# Patient Record
Sex: Female | Born: 1950 | Race: Black or African American | Hispanic: No | Marital: Single | Smoking: Former smoker
Health system: Southern US, Community
[De-identification: ages and names within clinical notes are randomized; demographics above are authoritative.]

## PROBLEM LIST (undated history)

## (undated) DIAGNOSIS — I700 Atherosclerosis of aorta: Secondary | ICD-10-CM

## (undated) DIAGNOSIS — Z87891 Personal history of nicotine dependence: Secondary | ICD-10-CM

## (undated) DIAGNOSIS — E119 Type 2 diabetes mellitus without complications: Secondary | ICD-10-CM

## (undated) DIAGNOSIS — I110 Hypertensive heart disease with heart failure: Secondary | ICD-10-CM

## (undated) DIAGNOSIS — I4729 Other ventricular tachycardia: Secondary | ICD-10-CM

## (undated) DIAGNOSIS — I509 Heart failure, unspecified: Secondary | ICD-10-CM

## (undated) DIAGNOSIS — I5089 Other heart failure: Secondary | ICD-10-CM

## (undated) DIAGNOSIS — I6529 Occlusion and stenosis of unspecified carotid artery: Secondary | ICD-10-CM

## (undated) DIAGNOSIS — Z86711 Personal history of pulmonary embolism: Secondary | ICD-10-CM

## (undated) DIAGNOSIS — R229 Localized swelling, mass and lump, unspecified: Secondary | ICD-10-CM

## (undated) DIAGNOSIS — I471 Supraventricular tachycardia, unspecified: Secondary | ICD-10-CM

## (undated) DIAGNOSIS — E785 Hyperlipidemia, unspecified: Secondary | ICD-10-CM

## (undated) DIAGNOSIS — J45909 Unspecified asthma, uncomplicated: Secondary | ICD-10-CM

## (undated) DIAGNOSIS — J449 Chronic obstructive pulmonary disease, unspecified: Secondary | ICD-10-CM

## (undated) DIAGNOSIS — E559 Vitamin D deficiency, unspecified: Secondary | ICD-10-CM

## (undated) DIAGNOSIS — I493 Ventricular premature depolarization: Secondary | ICD-10-CM

## (undated) DIAGNOSIS — M8580 Other specified disorders of bone density and structure, unspecified site: Secondary | ICD-10-CM

## (undated) DIAGNOSIS — I10 Essential (primary) hypertension: Secondary | ICD-10-CM

## (undated) DIAGNOSIS — M899 Disorder of bone, unspecified: Secondary | ICD-10-CM

## (undated) DIAGNOSIS — I2510 Atherosclerotic heart disease of native coronary artery without angina pectoris: Secondary | ICD-10-CM

## (undated) DIAGNOSIS — E669 Obesity, unspecified: Secondary | ICD-10-CM

## (undated) DIAGNOSIS — M1990 Unspecified osteoarthritis, unspecified site: Secondary | ICD-10-CM

## (undated) DIAGNOSIS — M109 Gout, unspecified: Secondary | ICD-10-CM

## (undated) DIAGNOSIS — I779 Disorder of arteries and arterioles, unspecified: Secondary | ICD-10-CM

## (undated) HISTORY — DX: Hypertensive heart disease with heart failure: I11.0

## (undated) HISTORY — DX: Heart failure, unspecified: I50.9

## (undated) HISTORY — DX: Other specified disorders of bone density and structure, unspecified site: M85.80

## (undated) HISTORY — DX: Unspecified asthma, uncomplicated: J45.909

## (undated) HISTORY — DX: Occlusion and stenosis of unspecified carotid artery: I65.29

## (undated) HISTORY — DX: Type 2 diabetes mellitus without complications: E11.9

## (undated) HISTORY — DX: Obesity, unspecified: E66.9

## (undated) HISTORY — DX: Disorder of bone, unspecified: M89.9

## (undated) HISTORY — DX: Personal history of pulmonary embolism: Z86.711

## (undated) HISTORY — DX: Personal history of nicotine dependence: Z87.891

## (undated) HISTORY — DX: Disorder of arteries and arterioles, unspecified: I77.9

## (undated) HISTORY — DX: Hypertensive heart disease with heart failure: I50.89

## (undated) HISTORY — DX: Other ventricular tachycardia: I47.29

## (undated) HISTORY — DX: Atherosclerosis of aorta: I70.0

## (undated) HISTORY — DX: Gout, unspecified: M10.9

## (undated) HISTORY — PX: ABDOMINAL HYSTERECTOMY: SHX81

## (undated) HISTORY — DX: Atherosclerotic heart disease of native coronary artery without angina pectoris: I25.10

## (undated) HISTORY — DX: Hyperlipidemia, unspecified: E78.5

## (undated) HISTORY — DX: Localized swelling, mass and lump, unspecified: R22.9

## (undated) HISTORY — DX: Ventricular premature depolarization: I49.3

## (undated) HISTORY — DX: Supraventricular tachycardia, unspecified: I47.10

## (undated) HISTORY — DX: Vitamin D deficiency, unspecified: E55.9

---

## 2000-09-06 ENCOUNTER — Emergency Department (HOSPITAL_COMMUNITY): Admission: EM | Admit: 2000-09-06 | Discharge: 2000-09-06 | Payer: Self-pay

## 2000-09-21 ENCOUNTER — Encounter: Admission: RE | Admit: 2000-09-21 | Discharge: 2000-09-21 | Payer: Self-pay | Admitting: Obstetrics & Gynecology

## 2000-10-13 ENCOUNTER — Ambulatory Visit: Admission: RE | Admit: 2000-10-13 | Discharge: 2000-10-13 | Payer: Self-pay | Admitting: Gynecology

## 2000-10-15 ENCOUNTER — Encounter: Payer: Self-pay | Admitting: Gynecology

## 2000-10-19 ENCOUNTER — Encounter (INDEPENDENT_AMBULATORY_CARE_PROVIDER_SITE_OTHER): Payer: Self-pay

## 2000-10-19 ENCOUNTER — Inpatient Hospital Stay (HOSPITAL_COMMUNITY): Admission: RE | Admit: 2000-10-19 | Discharge: 2000-10-21 | Payer: Self-pay | Admitting: Gynecology

## 2000-10-26 ENCOUNTER — Encounter: Admission: RE | Admit: 2000-10-26 | Discharge: 2000-10-26 | Payer: Self-pay | Admitting: Obstetrics & Gynecology

## 2008-07-14 ENCOUNTER — Emergency Department (HOSPITAL_COMMUNITY): Admission: EM | Admit: 2008-07-14 | Discharge: 2008-07-14 | Payer: Self-pay | Admitting: Emergency Medicine

## 2009-02-12 ENCOUNTER — Encounter: Admission: RE | Admit: 2009-02-12 | Discharge: 2009-02-12 | Payer: Self-pay | Admitting: Internal Medicine

## 2010-11-02 ENCOUNTER — Encounter: Payer: Self-pay | Admitting: Internal Medicine

## 2011-02-27 NOTE — Discharge Summary (Signed)
[facility]  Patient:    Miranda [name], Miranda [name]                      MRN: [id number] Adm. Date:  [id number] Disc. Date: 10/21/00 Attending:  Jeannette Corpus Dictator:   J CC:         GYN oncology office at [facility]  GYN outpatient clinic at [facility]   Discharge Summary  CHIEF COMPLAINT:  The patient is a 60 year old African-American female, para 2, who presents for operative management of bilateral adnexal masses post-hysterectomy.  Please see the dictated history and physical as per Dr. De Blanch for further details.  HOSPITAL COURSE:  The patient was admitted, underwent an exploratory laparotomy with bilateral salpingo-oophorectomy, lysis of adhesions, partial omentectomy, and bilateral ureterolysis.  Again, please see the dictated operative summary for further details.  The patients postoperative course was remarkable for a low-grade temperature with a maximum temperature of 100.4 within the first 24 hours.  This temperature was unsustained.  She was discharged to home on postoperative day #2, tolerating a regular diet.  DISCHARGE DIAGNOSIS:  Bilateral hydrosalpinges with benign ovarian cyst.  PROCEDURES:  Exploratory laparotomy with bilateral salpingo-oophorectomy, lysis of adhesions, partial omentectomy, and bilateral ureterolysis.  CONDITION ON DISCHARGE:  Good.  DIET:  Regular.  ACTIVITY:  No strenuous activity, no driving for 2 weeks.  DISCHARGE MEDICATIONS: 1. Percocet one or two tabs p.o. q.6h. p.r.n. 2. Climara patch every week.  DISPOSITION:  The patient was to return to the GYN outpatient clinic the following Tuesday for staple removal. DD:  10/21/00 TD:  10/21/00 Job: [id number]

## 2011-02-27 NOTE — Consult Note (Signed)
[facility]  Patient:    Miranda [name], Miranda [name]                      MRN: [id number] Proc. Date: 10/13/00 Adm. Date:  [id number] Attending:  Jeannette Corpus CC:         Roseanna Rainbow, M.D.  Telford Nab, R.N.   Consultation Report  HISTORY:  [age]-year-old African-American female, gravida 2, para 2, referred for consultation regarding management of a newly diagnosed large pelvic mass.  Patient underwent an abdominal hysterectomy in 1999 for treatment of fibroids. Her ovaries were left in at that time.  She presented to the emergency room at [facility] with left-sided flank pain and abdominal pain in November, at which time she had a CAT scan showing a large cystic mass of the right pelvis measuring 7.5 x 10.5 cm.  Some mural calcifications and nodular densities were noted in the wall of the cyst and a tubular-appearing cystic lesion in the left adnexa was also noted measuring 3.2 x 8.7 cm.  CA125 value was 6.8 u/ml.  Over the past several weeks, the patient has noted persistent pelvic fullness, pressure and slight discomfort but no acute pain.  She denies any nausea or vomiting or any other GI or GU symptoms.  PAST MEDICAL HISTORY Medical illnesses:  Cholelithiasis, chronic hypertension, pulmonary embolus associated with use of oral contraceptives.  PAST SURGICAL HISTORY:  Bilateral tubal ligation, TAH.  DRUG ALLERGIES:  None.  SOCIAL HISTORY:  The patient smokes one pack per day.  She has two living children.  She works in [employer].  FAMILY HISTORY:  Family history reveals mother with breast cancer.  There is no gynecologic or colon cancer in the family history.  CURRENT MEDICATIONS:  Norvasc.  REVIEW OF SYSTEMS:  Essentially negative except as noted in history of present illness.  PHYSICAL EXAMINATION  VITAL SIGNS:  Height 5 feet 8 inches.  Weight 178 pounds.  Blood pressure 150/90, pulse  76, respiratory rate 20.  GENERAL:  The patient is a healthy female in no acute distress.  HEENT:  Negative.  NECK:  Supple without thyromegaly.  NODES:  There is no supraclavicular or inguinal adenopathy.  ABDOMEN:  Soft, nontender.  There is some fullness in the suprapubic region. She has a well-healed Pfannenstiel incision which is retracted from secondary healing.  PELVIC:  EG/BUS normal.  Vagina, bladder and urethra are normal.  No lesions are noted.  Bimanual exam reveals a cystic lesion filling nearly the entire pelvis and extending several centimeters above the symphysis pubis. Rectovaginal exam confirms.  EXTREMITIES:  Lower extremities without edema or varicosities.  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  We would recommend the patient undergo exploratory laparotomy and bilateral salpingo-oophorectomy with associated frozen sections.  I believe this can be accomplished through a Pfannenstiel incision and this would allow [place] to revise her scar.  The patient is aware that if upper abdominal surgery is necessary, she may require a second midline incision.  We will proceed with surgery on January 8th, working in conjunction with Dr. Roseanna Rainbow.  Risks of surgery have been discussed; the patient wishes to proceed, as noted above. DD:  10/13/00 TD:  10/13/00 Job: [id number]

## 2011-02-27 NOTE — Op Note (Signed)
[facility]  Patient:    Miranda [name], Miranda [name]                      MRN: [id number] Adm. Date:  [id number] Attending:  Jeannette Corpus CC:         Roseanna Rainbow, M.D.             Telford Nab, R.N.                           Operative Report  PREOPERATIVE DIAGNOSIS:  Bilateral complex pelvic masses.  POSTOPERATIVE DIAGNOSES:  Bilateral ovarian cysts and hydrosalpinges, dense omental adhesions in the pelvis, retroperitoneal fibrosis.  PROCEDURE:  Exploratory laparotomy, extensive lysis of adhesions, bilateral salpingo-oophorectomy, partial omentectomy, bilateral ureterolysis.  SURGEON:  Daniel L. Clarke-Pearson, M.D.  ASSISTANTS Roseanna Rainbow, M.D. Telford Nab, R.N.  ANESTHESIA:  General with orotracheal tube.  ESTIMATED BLOOD LOSS:  200 cc.  SURGICAL FINDINGS:  At exploratory laparotomy, the omentum was found to be densely adherent to all pelvic peritoneal surfaces, including the vaginal cuff, bladder flap, sigmoid colon and the bilateral pelvic cysts.  There was retroperitoneal fibrosis.  The cysts were approximately 6 to 8 cm in diameter, filled with clear fluid and densely adherent to the lateral pelvic sidewall and sigmoid colon on the left.  The upper abdomen was normal.  DESCRIPTION OF PROCEDURE:  The patient was brought to the operating room and after satisfactory attainment of general anesthesia, was placed in the modified lithotomy position in the [place] stirrups.  The anterior abdominal wall, perineum and vagina were prepped, Foley catheter was placed and the patient was draped.  The abdomen was entered through the prior Pfannenstiel incision and additional skin and fat were excised in an elliptical incision in order to achieve a better cosmetic outcome, given that the prior incision was retracted.  Upon entering the peritoneal cavity, dense adhesions of the omentum to the anterior abdominal wall and pelvis were  found; these were freed with sharp and blunt dissection and using cautery for hemostasis.  Once the omentum was freed from its attachments, it was packed into the upper abdomen. Peritoneal washings were obtained.  The upper abdomen was explored, with the above-noted findings.  A Bookwalter retractor was positioned and the bowel packed out of the pelvis.  The right retroperitoneal space was opened, identifying the external and internal iliac arteries and veins and the ureter. The ovarian vessels were skeletonized, clamped, cut, free-tied and suture-ligated.  Further blunt and sharp dissection were used to free adhesions from the cyst to the sigmoid colon and bladder and residual omentum which remained in the pelvis.  The ureter was identified and because of her dense fibrosis adjacent to the peritoneal attachments to the cyst, ureterolysis was performed and the ureter was reflected laterally.  The remainder of the peritoneal attachments to the cyst and the lateral pelvic sidewall were incised.  The attachment of the cyst to the vaginal cuff was cross-clamped, divided and suture-ligated.  The cyst, which included the ovary and fallopian tube, was handed off the operative field and submitted for frozen section, which returned as probable fallopian tube cyst which was benign.  A similar procedure was performed on the right side of the pelvis where a similar cyst was found.  Ureterolysis was again performed in order to protect the ureter and free it from retroperitoneal fibrosis.  The sigmoid colon was adherent to the cyst and  this was freed away by blunt and sharp dissection.  The left ovary and tube were submitted to pathology for frozen section, which again returned benign.  The pelvis was irrigated with copious amounts of warm saline.  The ureters were reinspected and found to be free from any injury.  The omentum was then evaluated and because there were a number of bleeding sites, a  partial omentectomy was performed, clamping vascular pedicles distal to the transverse colon.  These pedicles were ligated using 2-0 Vicryl; hemostasis was then assured.  The pelvis was again irrigated with copious warm saline, the retractors and packs removed and the anterior abdominal wall closed in layers, the first being a running suture of 2-0 Vicryl on the peritoneum.  The fascia was reapproximated with a running suture of 0 PDS. Subcutaneous tissue was reapproximated using 3-0 Vicryl and the skin closed with skin staples.  A dressing was applied.  The patient was awakened from anesthesia and taken to the recovery room in satisfactory condition.  Sponge, needle and instrument counts were correct x 2. DD:  10/19/00 TD:  10/19/00 Job: [id number]

## 2013-09-15 ENCOUNTER — Other Ambulatory Visit: Payer: Self-pay | Admitting: Internal Medicine

## 2013-09-15 DIAGNOSIS — Z1231 Encounter for screening mammogram for malignant neoplasm of breast: Secondary | ICD-10-CM

## 2013-12-12 ENCOUNTER — Encounter: Payer: Self-pay | Admitting: Internal Medicine

## 2013-12-12 ENCOUNTER — Other Ambulatory Visit: Payer: Self-pay | Admitting: Internal Medicine

## 2014-01-09 ENCOUNTER — Encounter: Payer: Self-pay | Admitting: Internal Medicine

## 2016-03-12 ENCOUNTER — Ambulatory Visit (INDEPENDENT_AMBULATORY_CARE_PROVIDER_SITE_OTHER): Payer: BC Managed Care – PPO | Admitting: Physician Assistant

## 2016-03-12 VITALS — BP 128/72 | HR 71 | Temp 98.2°F | Resp 15 | Ht 68.0 in | Wt 181.4 lb

## 2016-03-12 DIAGNOSIS — Z760 Encounter for issue of repeat prescription: Secondary | ICD-10-CM

## 2016-03-12 DIAGNOSIS — M545 Low back pain, unspecified: Secondary | ICD-10-CM

## 2016-03-12 DIAGNOSIS — N390 Urinary tract infection, site not specified: Secondary | ICD-10-CM | POA: Diagnosis not present

## 2016-03-12 DIAGNOSIS — I10 Essential (primary) hypertension: Secondary | ICD-10-CM | POA: Diagnosis not present

## 2016-03-12 DIAGNOSIS — R82998 Other abnormal findings in urine: Secondary | ICD-10-CM

## 2016-03-12 LAB — CBC
HEMATOCRIT: 35.8 % (ref 35.0–45.0)
Hemoglobin: 11.9 g/dL (ref 11.7–15.5)
MCH: 30.4 pg (ref 27.0–33.0)
MCHC: 33.2 g/dL (ref 32.0–36.0)
MCV: 91.3 fL (ref 80.0–100.0)
MPV: 8.9 fL (ref 7.5–12.5)
PLATELETS: 317 10*3/uL (ref 140–400)
RBC: 3.92 MIL/uL (ref 3.80–5.10)
RDW: 14.3 % (ref 11.0–15.0)
WBC: 6.2 10*3/uL (ref 3.8–10.8)

## 2016-03-12 LAB — COMPLETE METABOLIC PANEL WITH GFR
ALT: 13 U/L (ref 6–29)
AST: 14 U/L (ref 10–35)
Albumin: 4 g/dL (ref 3.6–5.1)
Alkaline Phosphatase: 68 U/L (ref 33–130)
BUN: 14 mg/dL (ref 7–25)
CALCIUM: 9.3 mg/dL (ref 8.6–10.4)
CHLORIDE: 109 mmol/L (ref 98–110)
CO2: 22 mmol/L (ref 20–31)
CREATININE: 0.92 mg/dL (ref 0.50–0.99)
GFR, EST AFRICAN AMERICAN: 76 mL/min (ref >=60)
GFR, Est Non African American: 66 mL/min (ref >=60)
Glucose, Bld: 100 mg/dL — ABNORMAL HIGH (ref 65–99)
Potassium: 4.8 mmol/L (ref 3.5–5.3)
Sodium: 142 mmol/L (ref 135–146)
Total Bilirubin: 0.3 mg/dL (ref 0.2–1.2)
Total Protein: 7.1 g/dL (ref 6.1–8.1)

## 2016-03-12 LAB — POC MICROSCOPIC URINALYSIS (UMFC): Mucus: ABSENT

## 2016-03-12 LAB — POCT URINALYSIS DIP (MANUAL ENTRY)
Bilirubin, UA: NEGATIVE
Glucose, UA: NEGATIVE
Ketones, POC UA: NEGATIVE
Nitrite, UA: NEGATIVE
PH UA: 5
PROTEIN UA: NEGATIVE
SPEC GRAV UA: 1.015
UROBILINOGEN UA: 0.2

## 2016-03-12 LAB — TSH: TSH: 1.62 m[IU]/L

## 2016-03-12 MED ORDER — TELMISARTAN-HCTZ 40-12.5 MG PO TABS: 1.0000 | ORAL_TABLET | Freq: Every day | ORAL | Status: DC

## 2016-03-12 MED ORDER — NAPROXEN 500 MG PO TABS: 500.0000 mg | ORAL_TABLET | Freq: Two times a day (BID) | ORAL | Status: DC

## 2016-03-12 MED ORDER — CYCLOBENZAPRINE HCL 10 MG PO TABS: 5.0000 mg | ORAL_TABLET | Freq: Three times a day (TID) | ORAL | Status: DC | PRN

## 2016-03-12 NOTE — Patient Instructions (Addendum)
     IF you received an x-ray today, you will receive an invoice from [facility]. Please contact [facility] at [phone] with questions or concerns regarding your invoice.   IF you received labwork today, you will receive an invoice from [facility]. Please contact [facility] at [phone] with questions or concerns regarding your invoice.   Our billing staff will not be able to assist you with questions regarding bills from these companies.  You will be contacted with the lab results as soon as they are available. The fastest way to get your results is to activate your My Chart account. Instructions are located on the last page of this paperwork. If you have not heard from us regarding the results in 2 weeks, please contact this office.     We recommend that you schedule a mammogram for breast cancer screening. Typically, you do not need a referral to do this. Please contact a local imaging center to schedule your mammogram.  [facility] - [phone]  *ask for the Radiology Department [facility]) - [phone] or [phone]  [facility] - [phone] [facility] - [phone] [facility] - [phone]  *ask for the Radiology Department [facility] - [phone]  *ask for the Radiology Department [facility] - [phone]  *ask for the Mammography Department [facility] - [phone]  

## 2016-03-12 NOTE — Progress Notes (Signed)
03/12/2016 12:46 PM   DOB: Aug 29, 1951 / MRN: [id number]  SUBJECTIVE:  Miranda [name] is a 65 y.o. female presenting for low back pain that has been present for 2 weeks.  She denies any injury.  Has tried OTC Advil with some relief.  Feels that she is getting worse.  Denies weakness, paresthesia, incontinence, weight loss.  Has a 20 pack year history of smoking and quit three years ago.     She would like to move her care to [facility] and would like a refill of BP medication today.  She is okay with labs today.   She has no allergies on file.   She  has no past medical history on file.    She  reports that she has quit smoking. She does not have any smokeless tobacco history on file. She  has no sexual activity history on file. The patient  has no past surgical history on file.  Her family history is not on file.  Review of Systems  Constitutional: Negative for fever and chills.  Respiratory: Negative for cough.   Cardiovascular: Negative for chest pain.  Gastrointestinal: Negative for nausea.  Genitourinary: Negative for dysuria, urgency and frequency.  Skin: Negative for rash.  Neurological: Negative for dizziness and headaches.    Problem list and medications reviewed and updated by myself where necessary, and exist elsewhere in the encounter.   OBJECTIVE:  BP 128/72 mmHg  Pulse 71  Temp(Src) 98.2 F (36.8 C) (Oral)  Resp 15  Ht 5\' 8"  (1.727 m)  Wt 181 lb 6.4 oz (82.283 kg)  BMI 27.59 kg/m2  SpO2 98%  Physical Exam  Constitutional: She is oriented to person, place, and time. She appears well-nourished. No distress.  Eyes: EOM are normal. Pupils are equal, round, and reactive to light.  Cardiovascular: Normal rate.   Pulmonary/Chest: Effort normal.  Abdominal: She exhibits no distension.  Neurological: She is alert and oriented to person, place, and time. She has normal strength and normal reflexes. No cranial nerve deficit or sensory deficit. Gait normal.  Skin: Skin is  dry. She is not diaphoretic.  Psychiatric: She has a normal mood and affect.  Vitals reviewed.   Results for orders placed or performed in visit on 03/12/16 (from the past 72 hour(s))  POCT urinalysis dipstick     Status: Abnormal   Collection Time: 03/12/16 11:16 AM  Result Value Ref Range   Color, UA yellow yellow   Clarity, UA clear clear   Glucose, UA negative negative   Bilirubin, UA negative negative   Ketones, POC UA negative negative   Spec Grav, UA 1.015    Blood, UA trace-intact (A) negative   pH, UA 5.0    Protein Ur, POC negative negative   Urobilinogen, UA 0.2    Nitrite, UA Negative Negative   Leukocytes, UA moderate (2+) (A) Negative  POCT Microscopic Urinalysis ([facility])     Status: Abnormal   Collection Time: 03/12/16 12:28 PM  Result Value Ref Range   WBC,UR,HPF,POC Few (A) None WBC/hpf   RBC,UR,HPF,POC None None RBC/hpf   Bacteria Moderate (A) None, Too numerous to count   Mucus Absent Absent   Epithelial Cells, UR Per Microscopy Few (A) None, Too numerous to count cells/hpf    No results found.  ASSESSMENT AND PLAN  Miranda [name] was seen today for back pain and medication refill.  Diagnoses and all orders for this visit:  Bilateral low back pain without sciatica: NO red flags.  Likely  MSK.  Will treat.  Advised that 90% of back pain resolves on its own within 3-4 weeks.   -     POCT urinalysis dipstick -     naproxen (NAPROSYN) 500 MG tablet; Take 1 tablet (500 mg total) by mouth 2 (two) times daily with a meal. -     cyclobenzaprine (FLEXERIL) 10 MG tablet; Take 0.5-1 tablets (5-10 mg total) by mouth 3 (three) times daily as needed for muscle spasms.  Encounter for medication refill: Well controlled.  Continue current plan.  -     COMPLETE METABOLIC PANEL WITH GFR -     CBC -     TSH -     telmisartan-hydrochlorothiazide (MICARDIS HCT) 40-12.5 MG tablet; Take 1 tablet by mouth daily. Return in 12/17 for refills.  Urine Leukocytes increased: Micro shows  epis and few leuks.  I think it is fair to await the culture given she is not having urinary symptoms.   The patient was advised to call or return to clinic if she does not see an improvement in symptoms or to seek the care of the closest emergency department if she worsens with the above plan.   Miranda [name], MHS, PA-C [facility] 03/12/2016 12:46 PM

## 2016-03-14 LAB — URINE CULTURE

## 2016-07-17 ENCOUNTER — Ambulatory Visit (INDEPENDENT_AMBULATORY_CARE_PROVIDER_SITE_OTHER): Payer: Medicare Other

## 2016-07-17 ENCOUNTER — Ambulatory Visit (INDEPENDENT_AMBULATORY_CARE_PROVIDER_SITE_OTHER): Payer: Medicare Other | Admitting: Physician Assistant

## 2016-07-17 VITALS — BP 128/80 | HR 77 | Temp 98.3°F | Resp 17 | Ht 68.5 in | Wt 184.0 lb

## 2016-07-17 DIAGNOSIS — R9431 Abnormal electrocardiogram [ECG] [EKG]: Secondary | ICD-10-CM

## 2016-07-17 DIAGNOSIS — R059 Cough, unspecified: Secondary | ICD-10-CM

## 2016-07-17 DIAGNOSIS — H9313 Tinnitus, bilateral: Secondary | ICD-10-CM | POA: Diagnosis not present

## 2016-07-17 DIAGNOSIS — R05 Cough: Secondary | ICD-10-CM

## 2016-07-17 DIAGNOSIS — R42 Dizziness and giddiness: Secondary | ICD-10-CM | POA: Diagnosis not present

## 2016-07-17 DIAGNOSIS — R300 Dysuria: Secondary | ICD-10-CM

## 2016-07-17 DIAGNOSIS — R0789 Other chest pain: Secondary | ICD-10-CM

## 2016-07-17 LAB — POC MICROSCOPIC URINALYSIS (UMFC): Mucus: ABSENT

## 2016-07-17 LAB — POCT CBC
Granulocyte percent: 57.5 %G (ref 37–80)
HCT, POC: 37.7 % (ref 37.7–47.9)
HEMOGLOBIN: 13 g/dL (ref 12.2–16.2)
Lymph, poc: 2.4 (ref 0.6–3.4)
MCH: 30.8 pg (ref 27–31.2)
MCHC: 34.4 g/dL (ref 31.8–35.4)
MCV: 89.6 fL (ref 80–97)
MID (cbc): 0.5 (ref 0–0.9)
MPV: 6.8 fL (ref 0–99.8)
PLATELET COUNT, POC: 317 10*3/uL (ref 142–424)
POC Granulocyte: 4 (ref 2–6.9)
POC LYMPH PERCENT: 34.8 %L (ref 10–50)
POC MID %: 7.7 %M (ref 0–12)
RBC: 4.21 M/uL (ref 4.04–5.48)
RDW, POC: 14 %
WBC: 6.9 10*3/uL (ref 4.6–10.2)

## 2016-07-17 LAB — BASIC METABOLIC PANEL
BUN: 14 mg/dL (ref 7–25)
CALCIUM: 9.5 mg/dL (ref 8.6–10.4)
CHLORIDE: 103 mmol/L (ref 98–110)
CO2: 27 mmol/L (ref 20–31)
Creat: 0.9 mg/dL (ref 0.50–0.99)
GLUCOSE: 89 mg/dL (ref 65–99)
POTASSIUM: 4.2 mmol/L (ref 3.5–5.3)
SODIUM: 139 mmol/L (ref 135–146)

## 2016-07-17 LAB — POCT URINALYSIS DIP (MANUAL ENTRY)
BILIRUBIN UA: NEGATIVE
BILIRUBIN UA: NEGATIVE
GLUCOSE UA: NEGATIVE
Nitrite, UA: NEGATIVE
Protein Ur, POC: NEGATIVE
SPEC GRAV UA: 1.015
Urobilinogen, UA: 0.2
pH, UA: 5.5

## 2016-07-17 MED ORDER — DOXYCYCLINE HYCLATE 100 MG PO CAPS: 100.0000 mg | ORAL_CAPSULE | Freq: Two times a day (BID) | ORAL | 0 refills | Status: DC

## 2016-07-17 MED ORDER — MECLIZINE HCL 25 MG PO TABS: 25.0000 mg | ORAL_TABLET | Freq: Three times a day (TID) | ORAL | 0 refills | Status: DC | PRN

## 2016-07-17 NOTE — Patient Instructions (Addendum)
Take antibiotics for bronchitis. Use meclizine as needed for dizziness. I have put in a referral for both cardiology and ENT.   If you develop any chest pain, nausea, shortness of breath, call [phone] or seek care immediately.     IF you received an x-ray today, you will receive an invoice from [facility]. Please contact [facility] at [phone] with questions or concerns regarding your invoice.   IF you received labwork today, you will receive an invoice from [employer]. Please contact [facility] at [phone] with questions or concerns regarding your invoice.   Our billing staff will not be able to assist you with questions regarding bills from these companies.  You will be contacted with the lab results as soon as they are available. The fastest way to get your results is to activate your My Chart account. Instructions are located on the last page of this paperwork. If you have not heard from [place] regarding the results in 2 weeks, please contact this office.

## 2016-07-17 NOTE — Progress Notes (Signed)
Miranda [name]  MRN: [id number] DOB: 10/19/1950  Subjective:  Miranda [name] is a 65 y.o. female seen in office today for a chief complaint of multiple complaints.   1) Bilateral tinnitus x 2 months. Denies ear pain, decreased hearing or ear fullness. Notes it is getting progressively worse.   2) Dizziness (2 episodes in the past 2 weeks). States that's two weeks ago when she woke up and got up from bed she felt like the room was spinning. It lasted a few seconds and went away when she sat down but as she would try to move it would worsen. This occurred intermittently for a couple of hours. She then had another episode when she woke up six days ago and this lasted about ten minutes. Had associated sinus pressure. She is not having any dizziness today.   3) Chest pressure: She notes she is having intermittent chest congestion when she walks from her house to the mailbox, is on the treadmill, or mows the lawn x 3 months. Notes the congestion (described as a "gas bubble" sensation) goes away when she sits down and then she can finish the activity she had taken a break from. She has no symptoms doing daily activities. Does note that she had one episode of diaphoresis when she was mowing the law but states that it was also really hot outside so she does not know if it was asosicated with the chest congestion or the heat.  She does have heart palpitations daily since she has been taking micardis HCT 40-12.5 for the past 4 years.  Denies radiating symptoms, SOB, nausea, vomiting, and lightheadedness. Pt is a former smoker. She smoked a pack a day for 20 years. Three years clean. Has a family history of MI in mother at age 36yo.   Review of Systems  Constitutional: Negative for diaphoresis, fatigue and fever.  HENT: Positive for sneezing. Negative for ear pain.   Eyes: Positive for itching.  Respiratory: Positive for cough (intermittent). Negative for chest tightness.   Cardiovascular: Negative for leg  swelling.  Gastrointestinal: Negative for diarrhea, nausea and vomiting.  Genitourinary: Positive for dysuria (x 3 days ) and urgency. Negative for flank pain, hematuria, pelvic pain, vaginal bleeding and vaginal discharge.    Patient Active Problem List   Diagnosis Date Noted  . Hypertension 03/12/2016    Current Outpatient Prescriptions on File Prior to Visit  Medication Sig Dispense Refill  . telmisartan-hydrochlorothiazide (MICARDIS HCT) 40-12.5 MG tablet Take 1 tablet by mouth daily. Return in 12/17 for refills. 90 tablet 1  . cyclobenzaprine (FLEXERIL) 10 MG tablet Take 0.5-1 tablets (5-10 mg total) by mouth 3 (three) times daily as needed for muscle spasms. (Patient not taking: Reported on 07/17/2016) 30 tablet 0  . naproxen (NAPROSYN) 500 MG tablet Take 1 tablet (500 mg total) by mouth 2 (two) times daily with a meal. (Patient not taking: Reported on 07/17/2016) 30 tablet 0   No current facility-administered medications on file prior to visit.     No Known Allergies  Social History   Social History  . Marital status: Single    Spouse name: N/A  . Number of children: N/A  . Years of education: N/A   Occupational History  . Not on file.   Social History Main Topics  . Smoking status: Former Research scientist (life sciences)  . Smokeless tobacco: Not on file  . Alcohol use Not on file  . Drug use: Unknown  . Sexual activity: Not on file  Other Topics Concern  . Not on file   Social History Narrative  . No narrative on file    Objective:  BP 128/80 (BP Location: Right Arm, Patient Position: Sitting, Cuff Size: Normal)   Pulse 77   Temp 98.3 F (36.8 C) (Oral)   Resp 17   Ht 5' 8.5" (1.74 m)   Wt 184 lb (83.5 kg)   SpO2 97%   BMI 27.57 kg/m   Physical Exam  Constitutional: She is oriented to person, place, and time and well-developed, well-nourished, and in no distress.  HENT:  Head: Normocephalic and atraumatic.  Right Ear: Hearing, tympanic membrane, external ear and ear canal  normal.  Left Ear: Hearing, tympanic membrane, external ear and ear canal normal.  Nose: Mucosal edema present.  Mouth/Throat: Uvula is midline, oropharynx is clear and moist and mucous membranes are normal.  Neck: Normal range of motion.  Cardiovascular: Normal rate, normal heart sounds and normal pulses.  An irregular rhythm present.  Pulmonary/Chest: She has rhonchi (cleared with cough).  Neurological: She is alert and oriented to person, place, and time. Gait normal.  Psychiatric: Affect normal.  Vitals reviewed.  EKG shows sinus rhythm at 68bpm with ectopic ventricular beats and no acute ST/T wave changes. EKG findings were presented to and discussed with Dr. Carlota Raspberry.   Dg Chest 2 View  Result Date: 07/17/2016 CLINICAL DATA:  Cough for 2 months EXAM: CHEST  2 VIEW COMPARISON:  None. FINDINGS: Normal mediastinum and cardiac silhouette. Linear thickening in the lingula. Normal pulmonary vasculature. No evidence of effusion, infiltrate, or pneumothorax. No acute bony abnormality. IMPRESSION: Lingular atelectasis or bronchitis. Electronically Signed   By: Suzy Bouchard M.D.   On: 07/17/2016 13:26    Results for orders placed or performed in visit on 07/17/16 (from the past 24 hour(s))  POCT urinalysis dipstick     Status: Abnormal   Collection Time: 07/17/16  1:42 PM  Result Value Ref Range   Color, UA yellow yellow   Clarity, UA clear clear   Glucose, UA negative negative   Bilirubin, UA negative negative   Ketones, POC UA negative negative   Spec Grav, UA 1.015    Blood, UA trace-lysed (A) negative   pH, UA 5.5    Protein Ur, POC negative negative   Urobilinogen, UA 0.2    Nitrite, UA Negative Negative   Leukocytes, UA small (1+) (A) Negative  POCT CBC     Status: None   Collection Time: 07/17/16  1:43 PM  Result Value Ref Range   WBC 6.9 4.6 - 10.2 K/uL   Lymph, poc 2.4 0.6 - 3.4   POC LYMPH PERCENT 34.8 10 - 50 %L   MID (cbc) 0.5 0 - 0.9   POC MID % 7.7 0 - 12 %M    POC Granulocyte 4.0 2 - 6.9   Granulocyte percent 57.5 37 - 80 %G   RBC 4.21 4.04 - 5.48 M/uL   Hemoglobin 13.0 12.2 - 16.2 g/dL   HCT, POC 37.7 37.7 - 47.9 %   MCV 89.6 80 - 97 fL   MCH, POC 30.8 27 - 31.2 pg   MCHC 34.4 31.8 - 35.4 g/dL   RDW, POC 14.0 %   Platelet Count, POC 317 142 - 424 K/uL   MPV 6.8 0 - 99.8 fL  POCT Microscopic Urinalysis ([facility])     Status: Abnormal   Collection Time: 07/17/16  1:49 PM  Result Value Ref Range   WBC,UR,HPF,POC Few (A)  None WBC/hpf   RBC,UR,HPF,POC Few (A) None RBC/hpf   Bacteria Few (A) None, Too numerous to count   Mucus Absent Absent   Epithelial Cells, UR Per Microscopy Few (A) None, Too numerous to count cells/hpf    Assessment and Plan :  1. Dysuria Await urine culture results  - POCT urinalysis dipstick - POCT Microscopic Urinalysis ([facility]) - Urine culture  2. Chest discomfort - EKG 12-Lead - Ambulatory referral to Cardiology  3. Cough -Will cover for atypical organisms  - DG Chest 2 View; Future - POCT CBC - doxycycline (VIBRAMYCIN) 100 MG capsule; Take 1 capsule (100 mg total) by mouth 2 (two) times daily.  Dispense: 20 capsule; Refill: 0  4. Dizziness - Basic metabolic panel - Ambulatory referral to ENT - meclizine (ANTIVERT) 25 MG tablet; Take 1 tablet (25 mg total) by mouth 3 (three) times daily as needed for dizziness.  Dispense: 30 tablet; Refill: 0  5. Tinnitus of both ears - Ambulatory referral to ENT  6. EKG abnormalities - Ambulatory referral to Cardiology  Pt instructed that if she develops any chest pain, nausea, SOB, or diaphoresis to seek care immediately.     Tenna Delaine PA-C  [facility] 07/17/2016 7:32 PM

## 2016-07-19 LAB — URINE CULTURE

## 2016-09-04 ENCOUNTER — Ambulatory Visit (INDEPENDENT_AMBULATORY_CARE_PROVIDER_SITE_OTHER): Payer: Medicare Other | Admitting: Urgent Care

## 2016-09-04 VITALS — BP 150/90 | HR 67 | Temp 97.7°F | Resp 16 | Ht 68.5 in | Wt 194.0 lb

## 2016-09-04 DIAGNOSIS — Z8739 Personal history of other diseases of the musculoskeletal system and connective tissue: Secondary | ICD-10-CM | POA: Diagnosis not present

## 2016-09-04 DIAGNOSIS — M79675 Pain in left toe(s): Secondary | ICD-10-CM | POA: Diagnosis not present

## 2016-09-04 DIAGNOSIS — I10 Essential (primary) hypertension: Secondary | ICD-10-CM | POA: Diagnosis not present

## 2016-09-04 DIAGNOSIS — R030 Elevated blood-pressure reading, without diagnosis of hypertension: Secondary | ICD-10-CM

## 2016-09-04 MED ORDER — COLCHICINE 0.6 MG PO TABS: ORAL_TABLET | ORAL | 5 refills | Status: DC

## 2016-09-04 MED ORDER — INDOMETHACIN 50 MG PO CAPS: 50.0000 mg | ORAL_CAPSULE | Freq: Three times a day (TID) | ORAL | 6 refills | Status: DC

## 2016-09-04 NOTE — Patient Instructions (Addendum)
Gout Gout is painful swelling that can occur in some of your joints. Gout is a type of arthritis. This condition is caused by having too much uric acid in your body. Uric acid is a chemical that forms when your body breaks down substances called purines. Purines are important for building body proteins. When your body has too much uric acid, sharp crystals can form and build up inside your joints. This causes pain and swelling. Gout attacks can happen quickly and be very painful (acute gout). Over time, the attacks can affect more joints and become more frequent (chronic gout). Gout can also cause uric acid to build up under your skin and inside your kidneys. What are the causes? This condition is caused by too much uric acid in your blood. This can occur because:  Your kidneys do not remove enough uric acid from your blood. This is the most common cause.  Your body makes too much uric acid. This can occur with some cancers and cancer treatments. It can also occur if your body is breaking down too many red blood cells (hemolytic anemia).  You eat too many foods that are high in purines. These foods include organ meats and some seafood. Alcohol, especially beer, is also high in purines. A gout attack may be triggered by trauma or stress. What increases the risk? This condition is more likely to develop in people who:  Have a family history of gout.  Are female and middle-aged.  Are female and have gone through menopause.  Are obese.  Frequently drink alcohol, especially beer.  Are dehydrated.  Lose weight too quickly.  Have an organ transplant.  Have lead poisoning.  Take certain medicines, including aspirin, cyclosporine, diuretics, levodopa, and niacin.  Have kidney disease or psoriasis. What are the signs or symptoms? An attack of acute gout happens quickly. It usually occurs in just one joint. The most common place is the big toe. Attacks often start at night. Other joints that  may be affected include joints of the feet, ankle, knee, fingers, wrist, or elbow. Symptoms may include:  Severe pain.  Warmth.  Swelling.  Stiffness.  Tenderness. The affected joint may be very painful to touch.  Shiny, red, or purple skin.  Chills and fever. Chronic gout may cause symptoms more frequently. More joints may be involved. You may also have white or yellow lumps (tophi) on your hands or feet or in other areas near your joints. How is this diagnosed? This condition is diagnosed based on your symptoms, medical history, and physical exam. You may have tests, such as:  Blood tests to measure uric acid levels.  Removal of joint fluid with a needle (aspiration) to look for uric acid crystals.  X-rays to look for joint damage. How is this treated? Treatment for this condition has two phases: treating an acute attack and preventing future attacks. Acute gout treatment may include medicines to reduce pain and swelling, including:  NSAIDs.  Steroids. These are strong anti-inflammatory medicines that can be taken by mouth (orally) or injected into a joint.  Colchicine. This medicine relieves pain and swelling when it is taken soon after an attack. It can be given orally or through an IV tube. Preventive treatment may include:  Daily use of smaller doses of NSAIDs or colchicine.  Use of a medicine that reduces uric acid levels in your blood.  Changes to your diet. You may need to see a specialist about healthy eating (dietitian). Follow these instructions at home:  During a Gout Attack  If directed, apply ice to the affected area:  Put ice in a plastic bag.  Place a towel between your skin and the bag.  Leave the ice on for 20 minutes, 2-3 times a day.  Rest the joint as much as possible. If the affected joint is in your leg, you may be given crutches to use.  Raise (elevate) the affected joint above the level of your heart as often as possible.  Drink enough  fluids to keep your urine clear or pale yellow.  Take over-the-counter and prescription medicines only as told by your health care provider.  Do not drive or operate heavy machinery while taking prescription pain medicine.  Follow instructions from your health care provider about eating or drinking restrictions.  Return to your normal activities as told by your health care provider. Ask your health care provider what activities are safe for you. Avoiding Future Gout Attacks  Follow a low-purine diet as told by your dietitian or health care provider. Avoid foods and drinks that are high in purines, including liver, kidney, anchovies, asparagus, herring, mushrooms, mussels, and beer.  Limit alcohol intake to no more than 1 drink a day for nonpregnant women and 2 drinks a day for men. One drink equals 12 oz of beer, 5 oz of wine, or 1 oz of hard liquor.  Maintain a healthy weight or lose weight if you are overweight. If you want to lose weight, talk with your health care provider. It is important that you do not lose weight too quickly.  Start or maintain an exercise program as told by your health care provider.  Drink enough fluids to keep your urine clear or pale yellow.  Take over-the-counter and prescription medicines only as told by your health care provider.  Keep all follow-up visits as told by your health care provider. This is important. Contact a health care provider if:  You have another gout attack.  You continue to have symptoms of a gout attack after10 days of treatment.  You have side effects from your medicines.  You have chills or a fever.  You have burning pain when you urinate.  You have pain in your lower back or belly. Get help right away if:  You have severe or uncontrolled pain.  You cannot urinate. This information is not intended to replace advice given to you by your health care provider. Make sure you discuss any questions you have with your health  care provider. Document Released: 09/25/2000 Document Revised: 03/05/2016 Document Reviewed: 07/11/2015 Elsevier Interactive Patient Education  2017 [employer].     IF you received an x-ray today, you will receive an invoice from [facility]. Please contact [facility] at [phone] with questions or concerns regarding your invoice.   IF you received labwork today, you will receive an invoice from [employer]. Please contact [facility] at [phone] with questions or concerns regarding your invoice.   Our billing staff will not be able to assist you with questions regarding bills from these companies.  You will be contacted with the lab results as soon as they are available. The fastest way to get your results is to activate your My Chart account. Instructions are located on the last page of this paperwork. If you have not heard from [place] regarding the results in 2 weeks, please contact this office.

## 2016-09-04 NOTE — Progress Notes (Signed)
    MRN: [id number] DOB: 01/01/1951  Subjective:   Miranda [name] is a 65 y.o. female presenting for chief complaint of Gout (Left foot)  Has had 2 week history of intermittent left great toe pain and swelling. Reports history of gout in her feet. Has previously taken colchicine and indomethacin at the same time for this. She is out of these medications. Has had a difficult time ambulating. Denies fever, trauma, numbness or tingling. She is on HCTZ-telmisartan, diuretic. Checks her BP at home, runs 130's.   Miranda [name] has a current medication list which includes the following prescription(s): telmisartan-hydrochlorothiazide, cyclobenzaprine, and indomethacin. Also has No Known Allergies.  Miranda [name]  has no past medical history on file. Also  has no past surgical history on file.   Objective:   Vitals: BP (!) 150/90   Pulse 67   Temp 97.7 F (36.5 C) (Oral)   Resp 16   Ht 5' 8.5" (1.74 m)   Wt 194 lb (88 kg)   SpO2 98%   BMI 29.07 kg/m   BP Readings from Last 3 Encounters:  09/04/16 (!) 150/90  07/17/16 128/80  03/12/16 128/72    Physical Exam  Constitutional: She is oriented to person, place, and time. She appears well-developed and well-nourished.  Cardiovascular: Normal rate.   Pulmonary/Chest: Effort normal.  Musculoskeletal:       Left foot: There is decreased range of motion, tenderness, bony tenderness (1st MTP) and swelling (1st MTP). There is normal capillary refill, no crepitus, no deformity and no laceration.  Neurological: She is alert and oriented to person, place, and time.   Assessment and Plan :   1. Great toe pain, left 2. History of gout - Discussed gout management. She refused x-rays today, was reluctant to do blood work. I advised against using colchicine and indomethacin at the same. She will try indomethacin. If this doesn't work by Sunday, she will stop indomethacin and try colchicine. RTC thereafter if symptoms persist. Consider switching diuretic for CCB in  the future if gout flares persist.  3. Elevated blood pressure reading 4. Essential hypertension - Continue current regimen for now. Monitor BP at home, rtc if it remains elevated after gout flare treatment.  Jaynee Eagles, PA-C [facility] [phone] 09/04/2016 10:38 AM

## 2016-09-05 LAB — URIC ACID: Uric Acid, Serum: 7.7 mg/dL — ABNORMAL HIGH (ref 2.5–7.0)

## 2016-09-17 ENCOUNTER — Ambulatory Visit (INDEPENDENT_AMBULATORY_CARE_PROVIDER_SITE_OTHER): Payer: Medicare Other

## 2016-09-17 ENCOUNTER — Ambulatory Visit (INDEPENDENT_AMBULATORY_CARE_PROVIDER_SITE_OTHER): Payer: Medicare Other | Admitting: Physician Assistant

## 2016-09-17 VITALS — BP 142/80 | HR 77 | Temp 97.8°F | Resp 17 | Ht 68.5 in | Wt 187.0 lb

## 2016-09-17 DIAGNOSIS — M79672 Pain in left foot: Secondary | ICD-10-CM

## 2016-09-17 DIAGNOSIS — Z8739 Personal history of other diseases of the musculoskeletal system and connective tissue: Secondary | ICD-10-CM | POA: Diagnosis not present

## 2016-09-17 DIAGNOSIS — M25472 Effusion, left ankle: Secondary | ICD-10-CM

## 2016-09-17 LAB — POCT CBC
Granulocyte percent: 62.3 % (ref 37–80)
HCT, POC: 33.4 % — AB (ref 37.7–47.9)
Hemoglobin: 11.5 g/dL — AB (ref 12.2–16.2)
Lymph, poc: 2.3 (ref 0.6–3.4)
MCH, POC: 30.5 pg (ref 27–31.2)
MCHC: 34.5 g/dL (ref 31.8–35.4)
MCV: 88.3 fL (ref 80–97)
MID (cbc): 0.4 (ref 0–0.9)
MPV: 5.8 fL (ref 0–99.8)
POC Granulocyte: 4.4 (ref 2–6.9)
POC LYMPH PERCENT: 31.7 % (ref 10–50)
POC MID %: 6 %M (ref 0–12)
Platelet Count, POC: 363 10*3/uL (ref 142–424)
RBC: 3.78 M/uL — AB (ref 4.04–5.48)
RDW, POC: 14.1 %
WBC: 7.1 10*3/uL (ref 4.6–10.2)

## 2016-09-17 LAB — POCT SEDIMENTATION RATE: POCT SED RATE: 102 mm/h — AB (ref 0–22)

## 2016-09-17 MED ORDER — INDOMETHACIN 50 MG PO CAPS: 50.0000 mg | ORAL_CAPSULE | Freq: Three times a day (TID) | ORAL | 6 refills | Status: DC

## 2016-09-17 MED ORDER — COLCHICINE 0.6 MG PO TABS: ORAL_TABLET | ORAL | 5 refills | Status: DC

## 2016-09-17 NOTE — Patient Instructions (Addendum)
Please take Colchicine as directed, then take indomethacin if you are not better.  I will call you with the results of your labs.  Thank you for coming in today. I hope you feel we met your needs.  Feel free to call [facility] if you have any questions or further requests.  Please consider signing up for MyChart if you do not already have it, as this is a great way to communicate with me.  Best,  Whitney McVey, PA-C     IF you received an x-ray today, you will receive an invoice from [facility] Radiology. Please contact [facility] Radiology at [phone] with questions or concerns regarding your invoice.   IF you received labwork today, you will receive an invoice from [employer]. Please contact [facility] at [phone] with questions or concerns regarding your invoice.   Our billing staff will not be able to assist you with questions regarding bills from these companies.  You will be contacted with the lab results as soon as they are available. The fastest way to get your results is to activate your My Chart account. Instructions are located on the last page of this paperwork. If you have not heard from [place] regarding the results in 2 weeks, please contact this office.    We recommend that you schedule a mammogram for breast cancer screening. Typically, you do not need a referral to do this. Please contact a local imaging center to schedule your mammogram.  [facility] - [phone]  *ask for the Radiology Department [facility]) - [phone] or [phone]  [facility] - [phone] [place] [phone] [facility] - [phone]  *ask for the [facility] - [phone]  *ask for the Radiology Department [facility] - [phone]  *ask for the [place] - [phone]

## 2016-09-17 NOTE — Progress Notes (Signed)
Miranda [name]  MRN: [id number] DOB: 01/22/1951  PCP: Harvie Junior, MD  Subjective:  Pt is a 65 year old female who presents to clinic for left foot pain. She was here on 09/04/2016 with great toe swelling of her left foot. She was treated for gout with Indomethacin. Today she states her great toe pain and swelling have reduced almost completely, however she now has pain and swelling of her left ankle. It is not bad in the morning when she first wakes up, however gets worse throughout the day. She has to limp due to pain. She has tried ice and elevation. No MOI.  Denies fever, chills, redness, increased warmth.   History of gout - One episode of gout flare last year on her right foot.  Was prescribed dual therapy colchicine and indomethacin at that time.    Review of Systems  Constitutional: Negative for chills, diaphoresis, fatigue and fever.  HENT: Negative for congestion, postnasal drip, rhinorrhea, sinus pressure, sneezing and sore throat.   Respiratory: Negative for cough, chest tightness, shortness of breath and wheezing.   Cardiovascular: Negative for chest pain and palpitations.  Gastrointestinal: Negative for abdominal pain, diarrhea, nausea and vomiting.  Musculoskeletal: Positive for gait problem.  Skin: Positive for wound.  Neurological: Negative for weakness, light-headedness and headaches.    Patient Active Problem List   Diagnosis Date Noted  . Hypertension 03/12/2016    Current Outpatient Prescriptions on File Prior to Visit  Medication Sig Dispense Refill  . indomethacin (INDOCIN) 50 MG capsule Take 1 capsule (50 mg total) by mouth 3 (three) times daily with meals. 30 capsule 6  . telmisartan-hydrochlorothiazide (MICARDIS HCT) 40-12.5 MG tablet Take 1 tablet by mouth daily. Return in 12/17 for refills. 90 tablet 1  . colchicine 0.6 MG tablet Take 2 tablets now, then 1 in an hour. Wait 3 days to take this again. (Patient not taking: Reported on 09/17/2016) 30  tablet 5  . cyclobenzaprine (FLEXERIL) 10 MG tablet Take 0.5-1 tablets (5-10 mg total) by mouth 3 (three) times daily as needed for muscle spasms. (Patient not taking: Reported on 09/17/2016) 30 tablet 0   No current facility-administered medications on file prior to visit.     No Known Allergies   Objective:  BP (!) 142/80 (BP Location: Left Arm, Patient Position: Sitting, Cuff Size: Large)   Pulse 77   Temp 97.8 F (36.6 C) (Oral)   Resp 17   Ht 5' 8.5" (1.74 m)   Wt 187 lb (84.8 kg)   SpO2 97%   BMI 28.02 kg/m   Physical Exam  Constitutional: She is oriented to person, place, and time and well-developed, well-nourished, and in no distress. No distress.  Cardiovascular: Normal rate, regular rhythm and normal heart sounds.   Pulmonary/Chest: Effort normal. No respiratory distress.  Musculoskeletal:       Left ankle: She exhibits swelling (Pitting edema present on dorsal aspect of left ankle. TTP proximal 1st metatarsal. No erythema, warmth, ulceration, laceration, venous stasis, skin changes. ). She exhibits no ecchymosis, no deformity, no laceration and normal pulse. Achilles tendon normal.  Neurological: She is alert and oriented to person, place, and time. GCS score is 15.  Skin: Skin is warm and dry.  Psychiatric: Mood, memory, affect and judgment normal.  Vitals reviewed.   Dg Foot Complete Left  Result Date: 09/17/2016 CLINICAL DATA:  History of recent gout flare, foot pain with swelling, no injury EXAM: LEFT FOOT - COMPLETE 3+ VIEW COMPARISON:  None. FINDINGS: There is soft tissue swelling adjacent to the left first MTP joint medially. In addition there is some demineralization of the head of the left first metatarsal and the base of the proximal phalanx with a questionable erosion involving the base of the proximal phalanx of the left great toe, findings suspicious for gout. No soft tissue calcification is seen. Tarsal -metatarsal alignment is normal. Joint spaces otherwise  of normal with mild degenerative change of the left first MTP joint noted. IMPRESSION: 1. Findings consistent with gout involving the left first MTP joint. 2. Mild degenerative change of the left first MTP joint Electronically Signed   By: Ivar Drape M.D.   On: 09/17/2016 11:32   Results for orders placed or performed in visit on 09/17/16  POCT CBC  Result Value Ref Range   WBC 7.1 4.6 - 10.2 K/uL   Lymph, poc 2.3 0.6 - 3.4   POC LYMPH PERCENT 31.7 10 - 50 %L   MID (cbc) 0.4 0 - 0.9   POC MID % 6.0 0 - 12 %M   POC Granulocyte 4.4 2 - 6.9   Granulocyte percent 62.3 37 - 80 %G   RBC 3.78 (A) 4.04 - 5.48 M/uL   Hemoglobin 11.5 (A) 12.2 - 16.2 g/dL   HCT, POC 33.4 (A) 37.7 - 47.9 %   MCV 88.3 80 - 97 fL   MCH, POC 30.5 27 - 31.2 pg   MCHC 34.5 31.8 - 35.4 g/dL   RDW, POC 14.1 %   Platelet Count, POC 363 142 - 424 K/uL   MPV 5.8 0 - 99.8 fL    Assessment and Plan :  1. Foot pain, left 2. History of gout 3. Left ankle swelling - DG Foot Complete Left; Future - POCT CBC - Uric Acid - POCT SEDIMENTATION RATE - colchicine 0.6 MG tablet; Take 2 tablets now, then 1 in an hour. Wait 3 days to take this again.  Dispense: 30 tablet; Refill: 5 - indomethacin (INDOCIN) 50 MG capsule; Take 1 capsule (50 mg total) by mouth 3 (three) times daily with meals.  Dispense: 30 capsule; Refill: 6 - Will treat for complication of gout flare. Labs pending, will treat for gout. Supportive care encouraged: ice, elevation, compression.  RTC if no improvement in 3-5 days.   Mercer Pod, PA-C  [facility] 09/17/2016 10:50 AM

## 2016-09-18 DIAGNOSIS — Z8739 Personal history of other diseases of the musculoskeletal system and connective tissue: Secondary | ICD-10-CM | POA: Insufficient documentation

## 2016-09-18 LAB — URIC ACID: Uric Acid: 4.6 mg/dL (ref 2.5–7.1)

## 2017-06-16 ENCOUNTER — Other Ambulatory Visit: Payer: Self-pay | Admitting: Physician Assistant

## 2017-06-16 DIAGNOSIS — Z760 Encounter for issue of repeat prescription: Secondary | ICD-10-CM

## 2017-08-02 DIAGNOSIS — J441 Chronic obstructive pulmonary disease with (acute) exacerbation: Secondary | ICD-10-CM | POA: Insufficient documentation

## 2017-11-04 IMAGING — DX DG CHEST 2V
2 series · 2 of 2 positions shown · non-contrast
Comparison: None.

CLINICAL DATA: Cough for 2 months

EXAM:
CHEST  2 VIEW

[chest pa]
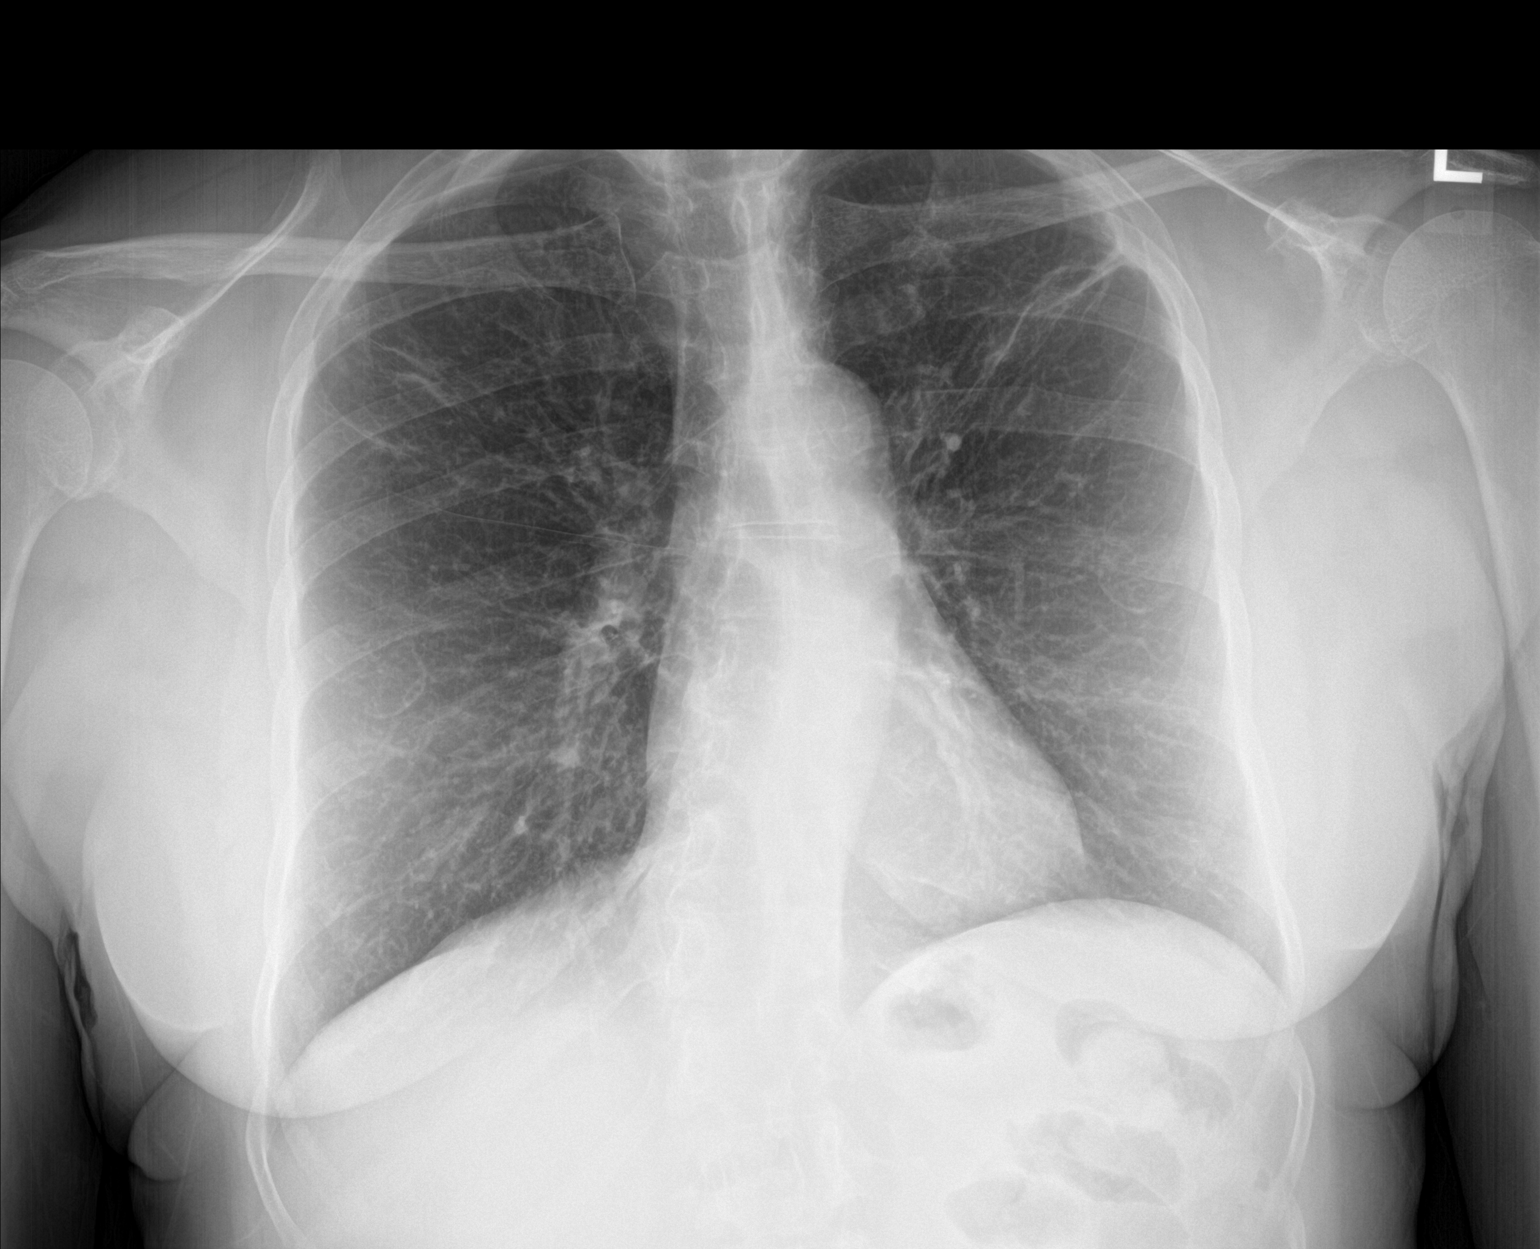

[chest lat]
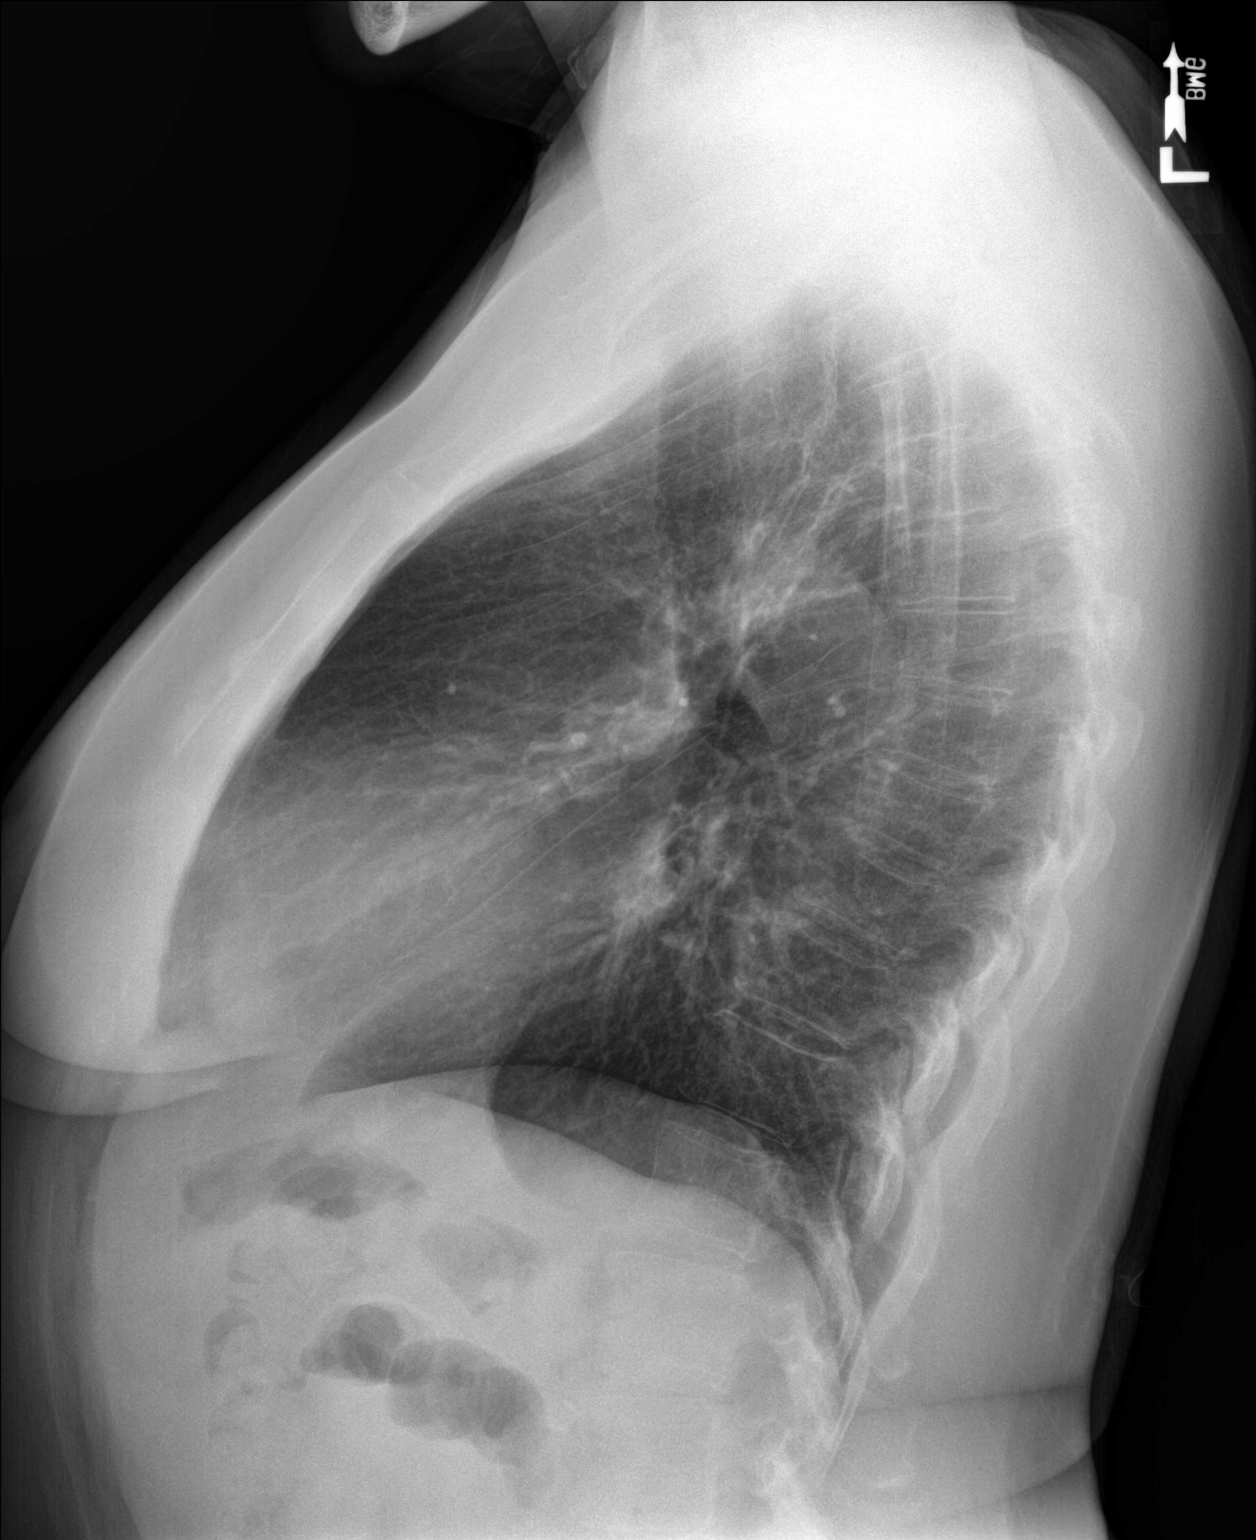

[2 of 2 positions shown; findings below may reference images not displayed]

FINDINGS: Normal mediastinum and cardiac silhouette. Linear thickening in the
lingula. Normal pulmonary vasculature. No evidence of effusion,
infiltrate, or pneumothorax. No acute bony abnormality.
IMPRESSION: Lingular atelectasis or bronchitis.

## 2019-11-13 DIAGNOSIS — Z9289 Personal history of other medical treatment: Secondary | ICD-10-CM

## 2019-11-13 DIAGNOSIS — J189 Pneumonia, unspecified organism: Secondary | ICD-10-CM

## 2019-11-13 HISTORY — DX: Personal history of other medical treatment: Z92.89

## 2019-11-13 HISTORY — DX: Pneumonia, unspecified organism: J18.9

## 2019-11-25 ENCOUNTER — Inpatient Hospital Stay (HOSPITAL_COMMUNITY)
Admission: EM | Admit: 2019-11-25 | Discharge: 2019-12-04 | DRG: 233 | Disposition: A | Discharge status: Home / Self Care | Payer: Medicare PPO | Attending: Cardiothoracic Surgery | Admitting: Cardiothoracic Surgery

## 2019-11-25 DIAGNOSIS — Z90710 Acquired absence of both cervix and uterus: Secondary | ICD-10-CM

## 2019-11-25 DIAGNOSIS — D649 Anemia, unspecified: Secondary | ICD-10-CM

## 2019-11-25 DIAGNOSIS — Z951 Presence of aortocoronary bypass graft: Secondary | ICD-10-CM

## 2019-11-25 DIAGNOSIS — Z09 Encounter for follow-up examination after completed treatment for conditions other than malignant neoplasm: Secondary | ICD-10-CM

## 2019-11-25 DIAGNOSIS — J440 Chronic obstructive pulmonary disease with acute lower respiratory infection: Secondary | ICD-10-CM | POA: Diagnosis present

## 2019-11-25 DIAGNOSIS — D473 Essential (hemorrhagic) thrombocythemia: Secondary | ICD-10-CM

## 2019-11-25 DIAGNOSIS — I214 Non-ST elevation (NSTEMI) myocardial infarction: Principal | ICD-10-CM

## 2019-11-25 DIAGNOSIS — Z79899 Other long term (current) drug therapy: Secondary | ICD-10-CM

## 2019-11-25 DIAGNOSIS — D62 Acute posthemorrhagic anemia: Secondary | ICD-10-CM | POA: Diagnosis not present

## 2019-11-25 DIAGNOSIS — J189 Pneumonia, unspecified organism: Secondary | ICD-10-CM

## 2019-11-25 DIAGNOSIS — R739 Hyperglycemia, unspecified: Secondary | ICD-10-CM

## 2019-11-25 DIAGNOSIS — Z7951 Long term (current) use of inhaled steroids: Secondary | ICD-10-CM

## 2019-11-25 DIAGNOSIS — E785 Hyperlipidemia, unspecified: Secondary | ICD-10-CM

## 2019-11-25 DIAGNOSIS — Z20822 Contact with and (suspected) exposure to covid-19: Secondary | ICD-10-CM | POA: Diagnosis present

## 2019-11-25 DIAGNOSIS — I10 Essential (primary) hypertension: Secondary | ICD-10-CM | POA: Diagnosis present

## 2019-11-25 DIAGNOSIS — Z8249 Family history of ischemic heart disease and other diseases of the circulatory system: Secondary | ICD-10-CM

## 2019-11-25 DIAGNOSIS — R7309 Other abnormal glucose: Secondary | ICD-10-CM

## 2019-11-25 DIAGNOSIS — I25110 Atherosclerotic heart disease of native coronary artery with unstable angina pectoris: Secondary | ICD-10-CM | POA: Diagnosis present

## 2019-11-25 DIAGNOSIS — Z7982 Long term (current) use of aspirin: Secondary | ICD-10-CM

## 2019-11-25 DIAGNOSIS — Z9889 Other specified postprocedural states: Secondary | ICD-10-CM

## 2019-11-25 DIAGNOSIS — M109 Gout, unspecified: Secondary | ICD-10-CM | POA: Diagnosis present

## 2019-11-25 DIAGNOSIS — Z87891 Personal history of nicotine dependence: Secondary | ICD-10-CM

## 2019-11-25 DIAGNOSIS — R000 Tachycardia, unspecified: Secondary | ICD-10-CM | POA: Diagnosis not present

## 2019-11-25 DIAGNOSIS — I2699 Other pulmonary embolism without acute cor pulmonale: Secondary | ICD-10-CM | POA: Diagnosis present

## 2019-11-25 DIAGNOSIS — D75839 Thrombocytosis, unspecified: Secondary | ICD-10-CM

## 2019-11-25 HISTORY — DX: Essential (primary) hypertension: I10

## 2019-11-25 HISTORY — DX: Chronic obstructive pulmonary disease, unspecified: J44.9

## 2019-11-26 ENCOUNTER — Inpatient Hospital Stay (HOSPITAL_COMMUNITY): Payer: Medicare PPO

## 2019-11-26 ENCOUNTER — Emergency Department (HOSPITAL_COMMUNITY): Payer: Medicare PPO

## 2019-11-26 ENCOUNTER — Encounter (HOSPITAL_COMMUNITY): Payer: Self-pay | Admitting: *Deleted

## 2019-11-26 ENCOUNTER — Other Ambulatory Visit: Payer: Self-pay

## 2019-11-26 DIAGNOSIS — Z7951 Long term (current) use of inhaled steroids: Secondary | ICD-10-CM | POA: Diagnosis not present

## 2019-11-26 DIAGNOSIS — Z79899 Other long term (current) drug therapy: Secondary | ICD-10-CM | POA: Diagnosis not present

## 2019-11-26 DIAGNOSIS — I2699 Other pulmonary embolism without acute cor pulmonale: Secondary | ICD-10-CM | POA: Diagnosis present

## 2019-11-26 DIAGNOSIS — I10 Essential (primary) hypertension: Secondary | ICD-10-CM | POA: Diagnosis present

## 2019-11-26 DIAGNOSIS — J189 Pneumonia, unspecified organism: Secondary | ICD-10-CM

## 2019-11-26 DIAGNOSIS — D62 Acute posthemorrhagic anemia: Secondary | ICD-10-CM | POA: Diagnosis not present

## 2019-11-26 DIAGNOSIS — E785 Hyperlipidemia, unspecified: Secondary | ICD-10-CM

## 2019-11-26 DIAGNOSIS — I214 Non-ST elevation (NSTEMI) myocardial infarction: Secondary | ICD-10-CM | POA: Diagnosis present

## 2019-11-26 DIAGNOSIS — Z8249 Family history of ischemic heart disease and other diseases of the circulatory system: Secondary | ICD-10-CM | POA: Diagnosis not present

## 2019-11-26 DIAGNOSIS — D649 Anemia, unspecified: Secondary | ICD-10-CM

## 2019-11-26 DIAGNOSIS — R739 Hyperglycemia, unspecified: Secondary | ICD-10-CM | POA: Diagnosis present

## 2019-11-26 DIAGNOSIS — I361 Nonrheumatic tricuspid (valve) insufficiency: Secondary | ICD-10-CM | POA: Diagnosis not present

## 2019-11-26 DIAGNOSIS — I313 Pericardial effusion (noninflammatory): Secondary | ICD-10-CM | POA: Diagnosis not present

## 2019-11-26 DIAGNOSIS — Z20822 Contact with and (suspected) exposure to covid-19: Secondary | ICD-10-CM | POA: Diagnosis present

## 2019-11-26 DIAGNOSIS — D473 Essential (hemorrhagic) thrombocythemia: Secondary | ICD-10-CM | POA: Diagnosis present

## 2019-11-26 DIAGNOSIS — J440 Chronic obstructive pulmonary disease with acute lower respiratory infection: Secondary | ICD-10-CM | POA: Diagnosis present

## 2019-11-26 DIAGNOSIS — I2510 Atherosclerotic heart disease of native coronary artery without angina pectoris: Secondary | ICD-10-CM | POA: Diagnosis not present

## 2019-11-26 DIAGNOSIS — I25110 Atherosclerotic heart disease of native coronary artery with unstable angina pectoris: Secondary | ICD-10-CM | POA: Diagnosis present

## 2019-11-26 DIAGNOSIS — Z01810 Encounter for preprocedural cardiovascular examination: Secondary | ICD-10-CM | POA: Diagnosis not present

## 2019-11-26 DIAGNOSIS — Z7982 Long term (current) use of aspirin: Secondary | ICD-10-CM | POA: Diagnosis not present

## 2019-11-26 DIAGNOSIS — I340 Nonrheumatic mitral (valve) insufficiency: Secondary | ICD-10-CM | POA: Diagnosis not present

## 2019-11-26 DIAGNOSIS — R000 Tachycardia, unspecified: Secondary | ICD-10-CM | POA: Diagnosis not present

## 2019-11-26 DIAGNOSIS — M109 Gout, unspecified: Secondary | ICD-10-CM | POA: Diagnosis present

## 2019-11-26 DIAGNOSIS — Z87891 Personal history of nicotine dependence: Secondary | ICD-10-CM | POA: Diagnosis not present

## 2019-11-26 DIAGNOSIS — Z90710 Acquired absence of both cervix and uterus: Secondary | ICD-10-CM | POA: Diagnosis not present

## 2019-11-26 LAB — CBG MONITORING, ED: Glucose-Capillary: 121 mg/dL — ABNORMAL HIGH (ref 70–99)

## 2019-11-26 LAB — BASIC METABOLIC PANEL
Anion gap: 12 (ref 5–15)
BUN: 20 mg/dL (ref 8–23)
CO2: 23 mmol/L (ref 22–32)
Calcium: 9.2 mg/dL (ref 8.9–10.3)
Chloride: 104 mmol/L (ref 98–111)
Creatinine, Ser: 0.9 mg/dL (ref 0.44–1.00)
GFR calc Af Amer: >60 mL/min (ref >60)
GFR calc non Af Amer: >60 mL/min (ref >60)
Glucose, Bld: 236 mg/dL — ABNORMAL HIGH (ref 70–99)
Potassium: 4.7 mmol/L (ref 3.5–5.1)
Sodium: 139 mmol/L (ref 135–145)

## 2019-11-26 LAB — HEPARIN LEVEL (UNFRACTIONATED)
Heparin Unfractionated: 0.32 IU/mL (ref 0.30–0.70)
Heparin Unfractionated: 0.67 IU/mL (ref 0.30–0.70)

## 2019-11-26 LAB — SARS CORONAVIRUS 2 (TAT 6-24 HRS): SARS Coronavirus 2: NEGATIVE

## 2019-11-26 LAB — CBC
HCT: 33.7 % — ABNORMAL LOW (ref 36.0–46.0)
Hemoglobin: 10.8 g/dL — ABNORMAL LOW (ref 12.0–15.0)
MCH: 30.6 pg (ref 26.0–34.0)
MCHC: 32 g/dL (ref 30.0–36.0)
MCV: 95.5 fL (ref 80.0–100.0)
Platelets: 499 10*3/uL — ABNORMAL HIGH (ref 150–400)
RBC: 3.53 MIL/uL — ABNORMAL LOW (ref 3.87–5.11)
RDW: 12.9 % (ref 11.5–15.5)
WBC: 8.6 10*3/uL (ref 4.0–10.5)
nRBC: 0 % (ref 0.0–0.2)

## 2019-11-26 LAB — FERRITIN: Ferritin: 148 ng/mL (ref 11–307)

## 2019-11-26 LAB — ABO/RH: ABO/RH(D): A POS

## 2019-11-26 LAB — D-DIMER, QUANTITATIVE
D-Dimer, Quant: 2.13 ug/mL-FEU — ABNORMAL HIGH (ref 0.00–0.50)
D-Dimer, Quant: 1.97 ug/mL-FEU — ABNORMAL HIGH (ref 0.00–0.50)

## 2019-11-26 LAB — LACTATE DEHYDROGENASE: LDH: 197 U/L — ABNORMAL HIGH (ref 98–192)

## 2019-11-26 LAB — TROPONIN I (HIGH SENSITIVITY)
Troponin I (High Sensitivity): 78 ng/L — ABNORMAL HIGH (ref <18)
Troponin I (High Sensitivity): 539 ng/L (ref <18)
Troponin I (High Sensitivity): 1555 ng/L (ref <18)
Troponin I (High Sensitivity): 1786 ng/L (ref <18)
Troponin I (High Sensitivity): 1092 ng/L (ref <18)
Troponin I (High Sensitivity): 932 ng/L (ref <18)

## 2019-11-26 LAB — LIPID PANEL
Cholesterol: 165 mg/dL (ref 0–200)
HDL: 39 mg/dL — ABNORMAL LOW (ref >40)
LDL Cholesterol: 114 mg/dL — ABNORMAL HIGH (ref 0–99)
Total CHOL/HDL Ratio: 4.2 RATIO
Triglycerides: 59 mg/dL (ref <150)
VLDL: 12 mg/dL (ref 0–40)

## 2019-11-26 LAB — IRON AND TIBC
Iron: 46 ug/dL (ref 28–170)
Saturation Ratios: 15 % (ref 10.4–31.8)
TIBC: 301 ug/dL (ref 250–450)
UIBC: 255 ug/dL

## 2019-11-26 LAB — ECHOCARDIOGRAM COMPLETE
Height: 68 in
Weight: 2880 oz

## 2019-11-26 LAB — C-REACTIVE PROTEIN: CRP: 4.1 mg/dL — ABNORMAL HIGH (ref <1.0)

## 2019-11-26 LAB — FIBRINOGEN: Fibrinogen: 617 mg/dL — ABNORMAL HIGH (ref 210–475)

## 2019-11-26 LAB — RESPIRATORY PANEL BY RT PCR (FLU A&B, COVID)
Influenza A by PCR: NEGATIVE
Influenza B by PCR: NEGATIVE
SARS Coronavirus 2 by RT PCR: NEGATIVE

## 2019-11-26 LAB — GLUCOSE, CAPILLARY
Glucose-Capillary: 301 mg/dL — ABNORMAL HIGH (ref 70–99)
Glucose-Capillary: 165 mg/dL — ABNORMAL HIGH (ref 70–99)
Glucose-Capillary: 155 mg/dL — ABNORMAL HIGH (ref 70–99)

## 2019-11-26 LAB — BRAIN NATRIURETIC PEPTIDE: B Natriuretic Peptide: 135.8 pg/mL — ABNORMAL HIGH (ref 0.0–100.0)

## 2019-11-26 LAB — HIV ANTIBODY (ROUTINE TESTING W REFLEX): HIV Screen 4th Generation wRfx: NONREACTIVE

## 2019-11-26 LAB — PROCALCITONIN: Procalcitonin: <0.1 ng/mL

## 2019-11-26 MED ORDER — AMLODIPINE BESYLATE 5 MG PO TABS
2.5000 mg | ORAL_TABLET | Freq: Every day | ORAL | Status: DC
  Administered 2019-11-26 – 2019-11-28 (×3): 2.5 mg via ORAL
  Filled 2019-11-26 (×3): qty 1

## 2019-11-26 MED ORDER — HEPARIN BOLUS VIA INFUSION
4000.0000 [IU] | Freq: Once | INTRAVENOUS | Status: AC
  Administered 2019-11-26: 4000 [IU] via INTRAVENOUS
  Filled 2019-11-26: qty 4000

## 2019-11-26 MED ORDER — ROSUVASTATIN CALCIUM 20 MG PO TABS: 20.0000 mg | ORAL_TABLET | Freq: Every day | ORAL | Status: DC

## 2019-11-26 MED ORDER — ASPIRIN 81 MG PO CHEW
324.0000 mg | CHEWABLE_TABLET | Freq: Once | ORAL | Status: AC
  Administered 2019-11-26: 324 mg via ORAL
  Filled 2019-11-26: qty 4

## 2019-11-26 MED ORDER — ATORVASTATIN CALCIUM 80 MG PO TABS
80.0000 mg | ORAL_TABLET | Freq: Every day | ORAL | Status: DC
  Administered 2019-11-26 – 2019-12-03 (×7): 80 mg via ORAL
  Filled 2019-11-26 (×7): qty 1

## 2019-11-26 MED ORDER — HEPARIN (PORCINE) 25000 UT/250ML-% IV SOLN
1150.0000 [IU]/h | INTRAVENOUS | Status: DC
  Administered 2019-11-26: 1200 [IU]/h via INTRAVENOUS
  Administered 2019-11-26: 1000 [IU]/h via INTRAVENOUS
  Filled 2019-11-26 (×2): qty 250

## 2019-11-26 MED ORDER — SODIUM CHLORIDE 0.9% FLUSH: 3.0000 mL | Freq: Once | INTRAVENOUS | Status: DC

## 2019-11-26 MED ORDER — IOHEXOL 350 MG/ML SOLN
80.0000 mL | Freq: Once | INTRAVENOUS | Status: AC | PRN
  Administered 2019-11-26: 57 mL via INTRAVENOUS

## 2019-11-26 MED ORDER — DOXYCYCLINE HYCLATE 100 MG PO TABS
100.0000 mg | ORAL_TABLET | Freq: Two times a day (BID) | ORAL | Status: AC
  Administered 2019-11-26 – 2019-11-30 (×9): 100 mg via ORAL
  Filled 2019-11-26 (×9): qty 1

## 2019-11-26 MED ORDER — MOMETASONE FURO-FORMOTEROL FUM 200-5 MCG/ACT IN AERO
2.0000 | INHALATION_SPRAY | Freq: Two times a day (BID) | RESPIRATORY_TRACT | Status: DC
  Filled 2019-11-26: qty 8.8

## 2019-11-26 MED ORDER — ASCORBIC ACID 500 MG PO TABS
500.0000 mg | ORAL_TABLET | Freq: Every day | ORAL | Status: DC
  Administered 2019-11-26 – 2019-11-28 (×3): 500 mg via ORAL
  Filled 2019-11-26 (×3): qty 1

## 2019-11-26 MED ORDER — ASPIRIN EC 81 MG PO TBEC
81.0000 mg | DELAYED_RELEASE_TABLET | Freq: Every day | ORAL | Status: DC
  Administered 2019-11-27 – 2019-11-28 (×2): 81 mg via ORAL
  Filled 2019-11-26 (×2): qty 1

## 2019-11-26 MED ORDER — NITROGLYCERIN 0.4 MG SL SUBL
0.4000 mg | SUBLINGUAL_TABLET | SUBLINGUAL | Status: DC | PRN
  Administered 2019-11-27: 0.4 mg via SUBLINGUAL
  Filled 2019-11-26 (×4): qty 1

## 2019-11-26 MED ORDER — ACETAMINOPHEN 325 MG PO TABS: 650.0000 mg | ORAL_TABLET | Freq: Four times a day (QID) | ORAL | Status: DC | PRN

## 2019-11-26 MED ORDER — ALBUTEROL SULFATE (2.5 MG/3ML) 0.083% IN NEBU: 2.5000 mg | INHALATION_SOLUTION | RESPIRATORY_TRACT | Status: DC | PRN

## 2019-11-26 MED ORDER — DEXAMETHASONE SODIUM PHOSPHATE 10 MG/ML IJ SOLN
6.0000 mg | Freq: Every day | INTRAMUSCULAR | Status: DC
  Administered 2019-11-26: 6 mg via INTRAVENOUS
  Filled 2019-11-26: qty 1

## 2019-11-26 MED ORDER — INSULIN ASPART 100 UNIT/ML ~~LOC~~ SOLN
0.0000 [IU] | Freq: Three times a day (TID) | SUBCUTANEOUS | Status: DC
  Administered 2019-11-26: 1 [IU] via SUBCUTANEOUS
  Administered 2019-11-26: 4 [IU] via SUBCUTANEOUS
  Administered 2019-11-27: 1 [IU] via SUBCUTANEOUS

## 2019-11-26 MED ORDER — BENZONATATE 100 MG PO CAPS: 100.0000 mg | ORAL_CAPSULE | Freq: Three times a day (TID) | ORAL | Status: DC | PRN

## 2019-11-26 MED ORDER — DOXYCYCLINE HYCLATE 100 MG PO TABS
100.0000 mg | ORAL_TABLET | Freq: Once | ORAL | Status: AC
  Administered 2019-11-26: 100 mg via ORAL
  Filled 2019-11-26: qty 1

## 2019-11-26 MED ORDER — VITAMIN E 180 MG (400 UNIT) PO CAPS
400.0000 [IU] | ORAL_CAPSULE | Freq: Every day | ORAL | Status: DC
  Administered 2019-11-26 – 2019-11-28 (×3): 400 [IU] via ORAL
  Filled 2019-11-26 (×5): qty 1

## 2019-11-26 MED ORDER — INSULIN ASPART 100 UNIT/ML ~~LOC~~ SOLN: 0.0000 [IU] | Freq: Every day | SUBCUTANEOUS | Status: DC

## 2019-11-26 NOTE — ED Notes (Signed)
RN Eugene Garnet to start a IV and draw labs

## 2019-11-26 NOTE — Progress Notes (Signed)
ANTICOAGULATION CONSULT NOTE - Follow Up Consult  Pharmacy Consult for heparin Indication: chest pain/ACS and pulmonary embolus  No Known Allergies  Patient Measurements: Height: 5\' 8"  (172.7 cm) Weight: 180 lb (81.6 kg) IBW/kg (Calculated) : 63.9 Heparin Dosing Weight: 80 kg  Vital Signs: Temp: 98.4 F (36.9 C) (02/14 0817) Temp Source: Oral (02/14 0817) BP: 130/85 (02/14 0817) Pulse Rate: 74 (02/14 0817)  Labs: Recent Labs    11/26/19 0006 11/26/19 0006 11/26/19 0213 11/26/19 0607 11/26/19 0718  HGB 10.8*  --   --   --   --   HCT 33.7*  --   --   --   --   PLT 499*  --   --   --   --   CREATININE 0.90  --   --   --   --   TROPONINIHS 78*   < > 539* 1,555* 1,786*   < > = values in this interval not displayed.    Estimated Creatinine Clearance: 67.1 mL/min (by C-G formula based on SCr of 0.9 mg/dL).   Medications:  Scheduled:  . amLODipine  2.5 mg Oral Daily  . ascorbic acid  500 mg Oral Daily  . [START ON 11/27/2019] aspirin EC  81 mg Oral Daily  . atorvastatin  80 mg Oral q1800  . doxycycline  100 mg Oral Q12H  . insulin aspart  0-5 Units Subcutaneous QHS  . insulin aspart  0-6 Units Subcutaneous TID WC  . mometasone-formoterol  2 puff Inhalation BID  . sodium chloride flush  3 mL Intravenous Once  . vitamin E  400 Units Oral Daily    Assessment: 87 yof presenting to the ED with chest pain found to have NSTEMI and CTA confirmed PE. No anticoagulation PTA.   Heparin level this afternoon is therapeutic at 0.32 on 1000 units/hr after a 4000 unit bolus. This level is at the low end of goal range. H&H at baseline is low at 10.8/33.7, plts elevated at 499. Of note, d-dimer is elevated at 1.97 and fibrinogen is elevated at 617.   Goal of Therapy:  Heparin level 0.3-0.7 units/ml Monitor platelets by anticoagulation protocol: Yes   Plan:  Empirically increase heparin infusion to 1200 units/hr Check anti-Xa level in 6 hours and daily while on heparin Continue  to monitor H&H and platelets  Follow up if dopplers obtained of lower extremities for DVT work up     Thank you,   Eddie Candle, PharmD PGY-1 Pharmacy Resident   Please check amion for clinical pharmacist contact number 11/26/2019,10:58 AM

## 2019-11-26 NOTE — ED Triage Notes (Signed)
No chest pain at present  She  Did have some sob and nausea  No previous history

## 2019-11-26 NOTE — H&P (Signed)
History and Physical    TAMAR DANTUONO [id number] DOB: 28-Jun-1951 DOA: 11/25/2019  PCP: Boyce Medici, FNP Patient coming from: Home  Chief Complaint: Chest pain  HPI: Miranda [name] is a 69 y.o. female with medical history significant of hypertension, hyperlipidemia, COPD, gout presented to the ED with complaints of chest pain.  Patient states after returning home from work she experienced chest pain while walking at home.  The chest pain was left-sided, pressure-like, and associated with shortness of breath.  She then sat down and symptoms improved.  When she got up again to walk the chest pain started again and was more severe this time.  It was now associated with nausea and diaphoresis.  She then rested again but continued to have symptoms.  States she has had episodes of exertional chest pain for several months.  Reports having cough and congestion for the past 1 week.  No fevers or sick contacts.  She was previously smoking cigarettes but quit several years ago.  States her mother had a massive heart attack in her 73s.  ED Course: Afebrile and hemodynamically stable.  Initial high-sensitivity troponin 78, repeat significantly elevated at 539.  EKG without acute ischemic changes.  D-dimer elevated at 2.13.  Chest x-ray showing widespread patchy pulmonary infiltrates consistent with pneumonia.  SARS-CoV-2 PCR test pending.  Influenza panel pending. CTA chest pending. ED provider discussed the case with Dr. Georgette Shell, cardiology will consult.  Patient received aspirin 324 mg, heparin bolus and infusion.  Received doxycycline.  Review of Systems:  All systems reviewed and apart from history of presenting illness, are negative.  Past Medical History:  Diagnosis Date  . HTN (hypertension)   . Hypertension     History reviewed. No pertinent surgical history.   reports that she has quit smoking. She has never used smokeless tobacco. She reports current alcohol use. No history on file  for drug.  No Known Allergies  Family History  Problem Relation Age of Onset  . Coronary artery disease Mother     Prior to Admission medications   Medication Sig Start Date End Date Taking? Authorizing Provider  albuterol (VENTOLIN HFA) 108 (90 Base) MCG/ACT inhaler Inhale 2 puffs into the lungs every 4 (four) hours as needed for wheezing or shortness of breath.  11/03/19  Yes [provider]  amLODipine (NORVASC) 2.5 MG tablet Take 2.5 mg by mouth daily. Pt may take additional dose as needed for HBP 11/03/19  Yes [provider]  ascorbic acid (VITAMIN C) 500 MG tablet Take 500 mg by mouth daily.   Yes [provider]  aspirin EC 81 MG tablet Take 81 mg by mouth every other day.   Yes [provider]  Chlorphen-Pseudoephed-APAP (THERAFLU FLU/COLD PO) Take 1 Package by mouth daily as needed (cold symptoms).   Yes [provider]  colchicine 0.6 MG tablet Take 2 tablets now, then 1 in an hour. Wait 3 days to take this again. 09/17/16  Yes McVey, Gelene Mink, PA-C  Fluticasone-Salmeterol (ADVAIR) 100-50 MCG/DOSE AEPB Inhale 1 puff into the lungs daily as needed (SOB).  11/03/19  Yes [provider]  indomethacin (INDOCIN) 50 MG capsule Take 1 capsule (50 mg total) by mouth 3 (three) times daily with meals. Patient taking differently: Take 50 mg by mouth 3 (three) times daily as needed for mild pain or moderate pain.  09/17/16  Yes McVey, Gelene Mink, PA-C  Phenylephrine-Aspirin (ALKA-SELTZER PLUS SINUS) 7.8-325 MG TBEF Take 2 tablets by mouth  daily as needed (cold symptoms).   Yes [provider]  rosuvastatin (CRESTOR) 20 MG tablet Take 20 mg by mouth daily.  11/03/19  Yes [provider]  telmisartan-hydrochlorothiazide (MICARDIS HCT) 40-12.5 MG tablet Take 1 tablet by mouth daily. Return in 12/17 for refills. 03/12/16  Yes Tereasa Coop, PA-C  vitamin E (VITAMIN E) 180 MG (400 UNITS) capsule Take 400 Units by  mouth daily.   Yes [provider]    Physical Exam: Vitals:   11/26/19 0315 11/26/19 0330 11/26/19 0400 11/26/19 0445  BP: 131/73 136/72 124/66 139/75  Pulse: 70 68 64 72  Resp: 17 17 17 16   Temp:      SpO2: 100% 98% 100% 97%  Weight:      Height:        Physical Exam  Constitutional: She is oriented to person, place, and time. She appears well-developed and well-nourished. No distress.  HENT:  Head: Normocephalic.  Eyes: Right eye exhibits no discharge. Left eye exhibits no discharge.  Cardiovascular: Normal rate, regular rhythm and intact distal pulses.  Pulmonary/Chest: Effort normal and breath sounds normal. No respiratory distress. She has no wheezes. She has no rales.  Abdominal: Soft. Bowel sounds are normal. She exhibits no distension. There is no abdominal tenderness. There is no guarding.  Musculoskeletal:        General: No edema.     Cervical back: Neck supple.  Neurological: She is alert and oriented to person, place, and time.  Skin: Skin is warm and dry. She is not diaphoretic.     Labs on Admission: I have personally reviewed following labs and imaging studies  CBC: Recent Labs  Lab 11/26/19 0006  WBC 8.6  HGB 10.8*  HCT 33.7*  MCV 95.5  PLT 99991111*   Basic Metabolic Panel: Recent Labs  Lab 11/26/19 0006  NA 139  K 4.7  CL 104  CO2 23  GLUCOSE 236*  BUN 20  CREATININE 0.90  CALCIUM 9.2   GFR: Estimated Creatinine Clearance: 67.1 mL/min (by C-G formula based on SCr of 0.9 mg/dL). Liver Function Tests: No results for input(s): AST, ALT, ALKPHOS, BILITOT, PROT, ALBUMIN in the last 168 hours. No results for input(s): LIPASE, AMYLASE in the last 168 hours. No results for input(s): AMMONIA in the last 168 hours. Coagulation Profile: No results for input(s): INR, PROTIME in the last 168 hours. Cardiac Enzymes: No results for input(s): CKTOTAL, CKMB, CKMBINDEX, TROPONINI in the last 168 hours. BNP (last 3 results) No results for  input(s): PROBNP in the last 8760 hours. HbA1C: No results for input(s): HGBA1C in the last 72 hours. CBG: No results for input(s): GLUCAP in the last 168 hours. Lipid Profile: No results for input(s): CHOL, HDL, LDLCALC, TRIG, CHOLHDL, LDLDIRECT in the last 72 hours. Thyroid Function Tests: No results for input(s): TSH, T4TOTAL, FREET4, T3FREE, THYROIDAB in the last 72 hours. Anemia Panel: No results for input(s): VITAMINB12, FOLATE, FERRITIN, TIBC, IRON, RETICCTPCT in the last 72 hours. Urine analysis:    Component Value Date/Time   BILIRUBINUR negative 07/17/2016 1342   KETONESUR negative 07/17/2016 1342   PROTEINUR negative 07/17/2016 1342   UROBILINOGEN 0.2 07/17/2016 1342   NITRITE Negative 07/17/2016 1342   LEUKOCYTESUR small (1+) (A) 07/17/2016 1342    Radiological Exams on Admission: DG Chest 2 View  Result Date: 11/26/2019 CLINICAL DATA:  Chest pain.  Shortness of breath. EXAM: CHEST - 2 VIEW COMPARISON:  07/17/2016 FINDINGS: Heart size upper limits of normal. Patchy bilateral  mid and lower lung pulmonary infiltrates most consistent with bronchopneumonia, either bacterial or viral. No evidence of dense consolidation or lobar collapse. No effusions. No acute bone finding. Chronic scoliosis. IMPRESSION: Widespread patchy pulmonary infiltrates most consistent with pneumonia. This could be bacterial or viral. Electronically Signed   By: Nelson Chimes M.D.   On: 11/26/2019 00:58    EKG: Independently reviewed.  Sinus rhythm, no acute ischemic changes.  Assessment/Plan Principal Problem:   NSTEMI (non-ST elevated myocardial infarction) [facility]) Active Problems:   Hypertension   Multifocal pneumonia   Hyperlipidemia   Anemia   NSTEMI Does have risk factors for CAD.  Initial high-sensitivity troponin 78, repeat significantly elevated at 539.  EKG without acute ischemic changes.  Currently chest pain-free. -Cardiac monitoring -Received full dose aspirin in the ED.  Continue  home aspirin 81 mg daily. -Start atorvastatin 80 mg daily -Heparin bolus and infusion -Trend troponin -Echocardiogram -Cardiology will consult -PE is also on the differential given elevated D-dimer of 2.13.  CT angiogram chest pending. -Check BNP level -Cardiology has seen the patient and planning on doing cardiac catheterization on Monday.  Multifocal pneumonia, suspect viral/ COVID-19 viral infection suspected Patient reports 1 week history of cough and congestion.  Chest x-ray showing widespread patchy pulmonary infiltrates consistent with pneumonia.  No signs of sepsis.  Not tachypneic or hypoxic.  Bacterial etiology less likely given no leukocytosis. -SARS-CoV-2 PCR test pending.  Will give Decadron given high suspicion for COVID-19 viral pneumonia.  If positive for COVID-19, start remdesivir.  Continue airborne and contact precautions. -Influenza panel pending -Patient received doxycycline.  Low suspicion for bacterial pneumonia, check procalcitonin level. -Antitussive as needed -CT chest as above for further evaluation -Continuous pulse ox, supplemental oxygen as needed to keep oxygen saturation above 90%  Chronic normocytic anemia Hemoglobin 10.8, was 11.5 on labs done in 2017.  Patient is not endorsing any melena or hematochezia.  No signs of active bleeding. -Check FOBT -Check iron, ferritin, TIBC -Please ensure outpatient follow-up for colonoscopy.  Mild thrombocytosis Platelet count 499.  No current tobacco use. -Continue to monitor  Hyperglycemia Random blood glucose 236.  No documented history of diabetes. -Check A1c -Sliding scale insulin very sensitive and CBG checks.  Hypertension -Normotensive.  Continue home amlodipine.  Hyperlipidemia -Start atorvastatin 80 mg daily -Check lipid panel  COPD -Stable.  Continue home inhalers.  DVT prophylaxis: Heparin Code Status: Full code Family Communication: No family available at this time. Disposition Plan:  Anticipate discharge after clinical improvement. Consults called: Cardiology Admission status: It is my clinical opinion that admission to INPATIENT is reasonable and necessary in this 69 y.o. female . presenting with NSTEMI and multifocal pneumonia concerning for possible COVID-19 viral infection.  Very high risk of decompensation.  Given the aforementioned, the predictability of an adverse outcome is felt to be significant. I expect that the patient will require at least 2 midnights in the hospital to treat this condition.   The medical decision making on this patient was of high complexity and the patient is at high risk for clinical deterioration, therefore this is a level 3 visit.  Shela Leff MD [facility]  If 7PM-7AM, please contact night-coverage [email or web address] Password [facility]  11/26/2019, 5:47 AM

## 2019-11-26 NOTE — Progress Notes (Signed)
   Patient seen early this morning by overnight fellow. She presented for further evaluation of chest pain. No acute EKG changes. High-sensitivity troponin elevated at 78 >> 539 >> 1,555 >> 1,786. D-dimer also elevated and chest x-ray consistent with pneumonia. Patient was started on IV Heparin and admitted to medicine service. Initial plan was for cath tomorrow. However, chest CTA showed PE and multifocal pneumonia. Patient has been started on IV antibiotics. Will hold off on cath for now. Continue IV Heparin. Echo pending. Patient comfortable at rest but still has some shorntess of breath with minimal activity. She initially denied chest pain to me; however, RN reports that she also complained of chest pain while ambulating to the restroom. I specifically asked patient about this and she said it was not pain but she felt like "something" was in her chest. Improved with rest. Will continue to trend troponin to peak. Will also recheck EKG. Will order PRN sublingual Nitro for persistent chest pain that does not improve with rest.  Darreld Mclean, PA-C 11/26/2019 12:23 PM

## 2019-11-26 NOTE — Progress Notes (Signed)
  Echocardiogram 2D Echocardiogram has been performed.  Johny Chess 11/26/2019, 12:07 PM

## 2019-11-26 NOTE — ED Provider Notes (Signed)
[facility] EMERGENCY DEPARTMENT Provider Note   CSN: [id number] Arrival date & time: 11/25/19  2359   History Chief Complaint  Patient presents with  . Chest Pain    Miranda [name] is a 69 y.o. female.  The history is provided by the patient.  Chest Pain She has history of hypertension and hyperlipidemia and comes in because of chest pain.  Chest pain started about 7 PM and was off and on until about 10 PM.  Pain was dull and she rated it 9/10 at its worse.  It did seem to be worse with exertion and also with laying down, better with sitting up and staying still.  There was some associated dyspnea, nausea, diaphoresis.  She did not vomit.  She has had a very slight cough which is nonproductive.  She denies history of diabetes.  There is a family history of premature coronary atherosclerosis.  Patient denies exposure to COVID-19.  She denies loss of smell or taste.  There has been no diarrhea and no arthralgias or myalgias.  Past Medical History:  Diagnosis Date  . Hypertension     Patient Active Problem List   Diagnosis Date Noted  . COPD exacerbation ([place]) 08/02/2017  . History of gout 09/18/2016  . Hypertension 03/12/2016    ** The histories are not reviewed yet. Please review them in the "History" navigator section and refresh this [place].   OB History   No obstetric history on file.     No family history on file.  Social History   Tobacco Use  . Smoking status: Former Research scientist (life sciences)  . Smokeless tobacco: Never Used  Substance Use Topics  . Alcohol use: Yes    Alcohol/week: 0.0 standard drinks  . Drug use: Not on file    Home Medications Prior to Admission medications   Medication Sig Start Date End Date Taking? Authorizing Provider  colchicine 0.6 MG tablet Take 2 tablets now, then 1 in an hour. Wait 3 days to take this again. 09/17/16   McVey, Gelene Mink, PA-C  cyclobenzaprine (FLEXERIL) 10 MG tablet Take 0.5-1 tablets (5-10 mg total)  by mouth 3 (three) times daily as needed for muscle spasms. Patient not taking: Reported on 09/17/2016 03/12/16   Tereasa Coop, PA-C  indomethacin (INDOCIN) 50 MG capsule Take 1 capsule (50 mg total) by mouth 3 (three) times daily with meals. 09/17/16   McVey, Gelene Mink, PA-C  telmisartan-hydrochlorothiazide (MICARDIS HCT) 40-12.5 MG tablet Take 1 tablet by mouth daily. Return in 12/17 for refills. 03/12/16   Tereasa Coop, PA-C    Allergies    Patient has no known allergies.  Review of Systems   Review of Systems  Cardiovascular: Positive for chest pain.  All other systems reviewed and are negative.   Physical Exam Updated Vital Signs BP 127/73 (BP Location: Right Arm)   Pulse 84   Temp 98.2 F (36.8 C)   Resp 17   Ht 5\' 8"  (1.727 m)   Wt 81.6 kg   SpO2 97%   BMI 27.37 kg/m   Physical Exam Vitals and nursing note reviewed.   69 year old female, resting comfortably and in no acute distress. Vital signs are normal. Oxygen saturation is 97%, which is normal. Head is normocephalic and atraumatic. PERRLA, EOMI. Oropharynx is clear. Neck is nontender and supple without adenopathy or JVD. Back is nontender and there is no CVA tenderness. Lungs are clear without rales, wheezes, or rhonchi. Chest is mildly tender in  the left parasternal area.  There is no crepitus. Heart has regular rate and rhythm without murmur. Abdomen is soft, flat, nontender without masses or hepatosplenomegaly and peristalsis is normoactive. Extremities have no cyanosis or edema, full range of motion is present. Skin is warm and dry without rash. Neurologic: Mental status is normal, cranial nerves are intact, there are no motor or sensory deficits.  ED Results / Procedures / Treatments   Labs (all labs ordered are listed, but only abnormal results are displayed) Labs Reviewed  BASIC METABOLIC PANEL - Abnormal; Notable for the following components:      Result Value   Glucose, Bld 236 (*)     All other components within normal limits  CBC - Abnormal; Notable for the following components:   RBC 3.53 (*)    Hemoglobin 10.8 (*)    HCT 33.7 (*)    Platelets 499 (*)    All other components within normal limits  D-DIMER, QUANTITATIVE (NOT AT [facility]) - Abnormal; Notable for the following components:   D-Dimer, Quant 2.13 (*)    All other components within normal limits  TROPONIN I (HIGH SENSITIVITY) - Abnormal; Notable for the following components:   Troponin I (High Sensitivity) 78 (*)    All other components within normal limits  TROPONIN I (HIGH SENSITIVITY) - Abnormal; Notable for the following components:   Troponin I (High Sensitivity) 539 (*)    All other components within normal limits  SARS CORONAVIRUS 2 (TAT 6-24 HRS)  RESPIRATORY PANEL BY RT PCR (FLU A&B, COVID)    EKG EKG Interpretation  Date/Time:  Sunday November 26 2019 00:00:09 EST Ventricular Rate:  76 PR Interval:  186 QRS Duration: 90 QT Interval:  394 QTC Calculation: 443 R Axis:   36 Text Interpretation: Normal sinus rhythm Normal ECG When compared with ECG of 10/15/2000, No significant change was found Confirmed by Delora Fuel ([id number]) on 11/26/2019 12:34:54 AM   Radiology DG Chest 2 View  Result Date: 11/26/2019 CLINICAL DATA:  Chest pain.  Shortness of breath. EXAM: CHEST - 2 VIEW COMPARISON:  07/17/2016 FINDINGS: Heart size upper limits of normal. Patchy bilateral mid and lower lung pulmonary infiltrates most consistent with bronchopneumonia, either bacterial or viral. No evidence of dense consolidation or lobar collapse. No effusions. No acute bone finding. Chronic scoliosis. IMPRESSION: Widespread patchy pulmonary infiltrates most consistent with pneumonia. This could be bacterial or viral. Electronically Signed   By: Nelson Chimes M.D.   On: 11/26/2019 00:58    Procedures Procedures  CRITICAL CARE Performed by: Delora Fuel Total critical care time: 50 minutes Critical care time was exclusive of  separately billable procedures and treating other patients. Critical care was necessary to treat or prevent imminent or life-threatening deterioration. Critical care was time spent personally by me on the following activities: development of treatment plan with patient and/or surrogate as well as nursing, discussions with consultants, evaluation of patient's response to treatment, examination of patient, obtaining history from patient or surrogate, ordering and performing treatments and interventions, ordering and review of laboratory studies, ordering and review of radiographic studies, pulse oximetry and re-evaluation of patient's condition.  Medications Ordered in ED Medications  sodium chloride flush (NS) 0.9 % injection 3 mL (has no administration in time range)  aspirin chewable tablet 324 mg (has no administration in time range)  doxycycline (VIBRA-TABS) tablet 100 mg (100 mg Oral Given 11/26/19 0130)    ED Course  I have reviewed the triage vital signs and the nursing notes.  Pertinent labs & imaging results that were available during my care of the patient were reviewed by me and considered in my medical decision making (see chart for details).  MDM Rules/Calculators/A&P Chest pain of uncertain cause.  ECG is normal.  She does have risk factors for coronary artery disease.  Heart score is 4 which puts her at moderate risk for major adverse cardiac events in the next 6 weeks.  Chest x-ray is felt to be consistent with pneumonia which brings COVID-19 as a possible diagnosis.  She is started on doxycycline.  Old records are reviewed, and she has no relevant past visits.  Labs show a mildly elevated troponin, moderately elevated D-dimer.  Mild anemia is present not significantly changed from baseline, but she does have thrombocytosis of uncertain cause.  Glucose is also elevated at 236.  CT angiogram of the chest has been ordered.  Repeat troponin has come back significantly elevated at 539.   Patient is given aspirin and started on heparin.  The case is discussed with Dr. Marlowe Sax of [facility], who agrees to admit the patient.  Case is also discussed with Dr. Georgette Shell of cardiology service who agrees to see the patient in consultation.  Also, with bilateral pulmonary infiltrates, possibility of COVID-19 is entertained and rapid COVID-19 test is sent.  Miranda [name] was evaluated in Emergency Department on 11/26/2019 for the symptoms described in the history of present illness. She was evaluated in the context of the global COVID-19 pandemic, which necessitated consideration that the patient might be at risk for infection with the SARS-CoV-2 virus that causes COVID-19. Institutional protocols and algorithms that pertain to the evaluation of patients at risk for COVID-19 are in a state of rapid change based on information released by regulatory bodies including the CDC and federal and state organizations. These policies and algorithms were followed during the patient's care in the ED.   Final Clinical Impression(s) / ED Diagnoses Final diagnoses:  NSTEMI (non-ST elevated myocardial infarction) ([place])  Normochromic normocytic anemia  Thrombocytosis ([facility])  Elevated random blood glucose level    Rx / DC Orders ED Discharge Orders    None       Delora Fuel, MD [id number]

## 2019-11-26 NOTE — Progress Notes (Signed)
  PROGRESS NOTE  Patient admitted earlier this morning. See H&P.   Miranda [name] is a 69 y.o. female with medical history significant of hypertension, hyperlipidemia, COPD, gout who presented to the ED with complaints of chest pain.  Patient states after returning home from work, she experienced chest pain while walking at home.  The chest pain was left-sided, pressure-like, and associated with shortness of breath.  She then sat down and symptoms improved.  When she got up again to walk the chest pain started again and was more severe this time.  It was now associated with nausea and diaphoresis.  She then rested again but continued to have symptoms.  States she has had episodes of exertional chest pain for several months.  Reports having productive cough and congestion for the past 1 week.  No fevers or sick contacts.  She was previously smoking cigarettes but quit several years ago.  States her mother had a massive heart attack in her 6s. Work up in the ED with initial high-sensitivity troponin 78, repeat significantly elevated at 539.  EKG without acute ischemic changes.  D-dimer elevated at 2.13.  Chest x-ray showing widespread patchy pulmonary infiltrates consistent with pneumonia.  SARS-CoV-2 PCR test negative. CTA chest showed multifocal pneumonia as well as RLL PE. Patient admitted for NSTEMI, cardiology consulted.   Patient examined at bedside. She remains clinically stable with heart rate and rhythm regular, bibasilar crackles, no conversational dyspnea on room air, appears comfortable at rest without any respiratory distress.  A/P:   NSTEMI -Troponin 78 --> 539 --> 1555 --> 1786  -Cardiology consulted -Heparin drip -Aspirin, Lipitor -Echocardiogram pending   Right lower lobe PE -CTA chest: positive for acute pulmonary embolus within a segmental branch of the posterior right lower lobe pulmonary artery. -Patient states that she had a blood clot decades ago while she was on birth  control. At that time, she remained on Coumadin for several months. 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 Findings are favored to represent multifocal pneumonia. Follow-up imaging is advised in 3-4 weeks following trial of antibiotic therapy to ensure resolution and exclude underlying malignancy. -Patient has had some productive cough for the past week without any fevers, symptoms have been improving -Continue doxycycline  Hypertension -Continue amlodipine  Hyperlipidemia -Continue Lipitor    Dessa Phi, DO [facility] 11/26/2019, 9:28 AM  Available via Epic secure chat 7am-7pm After these hours, please refer to coverage provider listed on [email or web address]

## 2019-11-26 NOTE — Progress Notes (Signed)
ANTICOAGULATION CONSULT NOTE - Follow Up Consult  Pharmacy Consult for heparin Indication: chest pain/ACS and pulmonary embolus  No Known Allergies  Patient Measurements: Height: 5\' 8"  (172.7 cm) Weight: 180 lb (81.6 kg) IBW/kg (Calculated) : 63.9 Heparin Dosing Weight: 80 kg  Vital Signs: Temp: 98.5 F (36.9 C) (02/14 1936) Temp Source: Oral (02/14 1936) BP: 126/65 (02/14 1936) Pulse Rate: 72 (02/14 1936)  Labs: Recent Labs    11/26/19 0006 11/26/19 0213 11/26/19 0718 11/26/19 1117 11/26/19 1246 11/26/19 1523 11/26/19 1915  HGB 10.8*  --   --   --   --   --   --   HCT 33.7*  --   --   --   --   --   --   PLT 499*  --   --   --   --   --   --   HEPARINUNFRC  --   --   --  0.32  --   --  0.67  CREATININE 0.90  --   --   --   --   --   --   TROPONINIHS 78*   < > 1,786*  --  1,092* 932*  --    < > = values in this interval not displayed.    Estimated Creatinine Clearance: 67.1 mL/min (by C-G formula based on SCr of 0.9 mg/dL).   Medications:  Scheduled:  . amLODipine  2.5 mg Oral Daily  . ascorbic acid  500 mg Oral Daily  . [START ON 11/27/2019] aspirin EC  81 mg Oral Daily  . atorvastatin  80 mg Oral q1800  . doxycycline  100 mg Oral Q12H  . insulin aspart  0-5 Units Subcutaneous QHS  . insulin aspart  0-6 Units Subcutaneous TID WC  . mometasone-formoterol  2 puff Inhalation BID  . sodium chloride flush  3 mL Intravenous Once  . vitamin E  400 Units Oral Daily    Assessment: 82 yof presenting to the ED with chest pain found to have NSTEMI and CTA confirmed PE. No anticoagulation PTA.   Confirmatory heparin level remains therapeutic at 0.67.  Goal of Therapy:  Heparin level 0.3-0.7 units/ml Monitor platelets by anticoagulation protocol: Yes   Plan:  -Continue heparin 1200 units/h -Recheck heparin level with morning labs   Arrie Senate, PharmD, BCPS Clinical Pharmacist [phone] Please check AMION for all [place] numbers 11/26/2019

## 2019-11-26 NOTE — Consult Note (Signed)
Cardiology Consultation:   Patient ID: Miranda [name] MRN: [id number]; DOB: 1951/09/06  Admit date: 11/25/2019 Date of Consult: 11/26/2019  Primary Care Provider: Boyce Medici, FNP Primary Cardiologist: No primary care provider on file.  Primary Electrophysiologist:  None    Patient Profile:   Miranda [name] is a 69 y.o. female with a hx of HTN who is being seen today for the evaluation of chest pain at the request of Dr. Roxanne Mins.  History of Present Illness:   Miranda [name] is a 69 year old woman with a history of hypertension, prior tobacco abuse who presents due to chest pain.  The patient reports that she has had intermittent chest discomfort for the last several years.  She reports that the first episode was a couple of summers ago while she was mowing the lawn and describes it as a substernal chest pressure.  Episodes were quite infrequent until the past 1 to 2 months where they have become more regular.  She reports that in the past, these were associated with exertion and would improve with rest however today she had an episode while she was sitting.  The patient reports that at approximately 7 PM her chest pain started while she was walking in her home it was substernal chest pressure which did not radiate.  It became intermittent for the next several hours and then she had a severe episode at rest at 9 out of 10 in intensity and was associated with shortness of breath, nausea and diaphoresis.  Fortunately, at the time of my examination, her chest pain has now subsided.  She does report that when she got up to use the restroom she had recurrent chest pressure with this exertion.  Otherwise, the patient denies fever, chills, abdominal pain, changes in urination or bowel movements.  She does report a "cold" that she has had trouble getting over for the past month.  She reports green sputum and chest congestion that, while improving has lingered since last month.  She denies ever getting a  Covid swab.  She does have a history in her chart documented of COPD but was not aware of this diagnosis and reports that she quit smoking approximately 5 to 6 years ago.  Family history pertinent for coronary artery disease in her mother.  Given the symptoms she presented to the ED tonight. On arrival, the patient was hemodynamically stable and EKG showed sinus rhythm without acute ischemic changes.  Blood work was significant for troponin which increased from 78-539.  She was treated with aspirin and started on a heparin drip and we are consulted for further evaluation of NSTEMI  Heart Pathway Score:     Past Medical History:  Diagnosis Date  . HTN (hypertension)   . Hypertension     History reviewed. No pertinent surgical history.   Home Medications:  Prior to Admission medications   Medication Sig Start Date End Date Taking? Authorizing Provider  albuterol (VENTOLIN HFA) 108 (90 Base) MCG/ACT inhaler Inhale 2 puffs into the lungs every 4 (four) hours as needed for wheezing or shortness of breath.  11/03/19  Yes [provider]  amLODipine (NORVASC) 2.5 MG tablet Take 2.5 mg by mouth daily. Pt may take additional dose as needed for HBP 11/03/19  Yes [provider]  ascorbic acid (VITAMIN C) 500 MG tablet Take 500 mg by mouth daily.   Yes [provider]  aspirin EC 81 MG tablet Take 81 mg by mouth every other day.   Yes  [provider]  Chlorphen-Pseudoephed-APAP (THERAFLU FLU/COLD PO) Take 1 Package by mouth daily as needed (cold symptoms).   Yes [provider]  colchicine 0.6 MG tablet Take 2 tablets now, then 1 in an hour. Wait 3 days to take this again. 09/17/16  Yes McVey, Gelene Mink, PA-C  Fluticasone-Salmeterol (ADVAIR) 100-50 MCG/DOSE AEPB Inhale 1 puff into the lungs daily as needed (SOB).  11/03/19  Yes [provider]  indomethacin (INDOCIN) 50 MG capsule Take 1 capsule (50 mg total) by mouth 3 (three) times daily with  meals. Patient taking differently: Take 50 mg by mouth 3 (three) times daily as needed for mild pain or moderate pain.  09/17/16  Yes McVey, Gelene Mink, PA-C  Phenylephrine-Aspirin (ALKA-SELTZER PLUS SINUS) 7.8-325 MG TBEF Take 2 tablets by mouth daily as needed (cold symptoms).   Yes [provider]  rosuvastatin (CRESTOR) 20 MG tablet Take 20 mg by mouth daily.  11/03/19  Yes [provider]  telmisartan-hydrochlorothiazide (MICARDIS HCT) 40-12.5 MG tablet Take 1 tablet by mouth daily. Return in 12/17 for refills. 03/12/16  Yes Tereasa Coop, PA-C  vitamin E (VITAMIN E) 180 MG (400 UNITS) capsule Take 400 Units by mouth daily.   Yes [provider]    Inpatient Medications: Scheduled Meds: . amLODipine  2.5 mg Oral Daily  . ascorbic acid  500 mg Oral Daily  . dexamethasone (DECADRON) injection  6 mg Intravenous Daily  . insulin aspart  0-5 Units Subcutaneous QHS  . insulin aspart  0-6 Units Subcutaneous TID WC  . mometasone-formoterol  2 puff Inhalation BID  . rosuvastatin  20 mg Oral Daily  . sodium chloride flush  3 mL Intravenous Once  . vitamin E  400 Units Oral Daily   Continuous Infusions: . heparin 1,000 Units/hr (11/26/19 0426)   PRN Meds:   Allergies:   No Known Allergies  Social History:   Social History   Socioeconomic History  . Marital status: Single    Spouse name: Not on file  . Number of children: Not on file  . Years of education: Not on file  . Highest education level: Not on file  Occupational History  . Not on file  Tobacco Use  . Smoking status: Former Research scientist (life sciences)  . Smokeless tobacco: Never Used  Substance and Sexual Activity  . Alcohol use: Yes    Alcohol/week: 0.0 standard drinks  . Drug use: Not on file  . Sexual activity: Not on file  Other Topics Concern  . Not on file  Social History Narrative  . Not on file   Social Determinants of Health   Financial Resource Strain:   . Difficulty of Paying Living  Expenses: Not on file  Food Insecurity:   . Worried About Charity fundraiser in the Last Year: Not on file  . Ran Out of Food in the Last Year: Not on file  Transportation Needs:   . Lack of Transportation (Medical): Not on file  . Lack of Transportation (Non-Medical): Not on file  Physical Activity:   . Days of Exercise per Week: Not on file  . Minutes of Exercise per Session: Not on file  Stress:   . Feeling of Stress : Not on file  Social Connections:   . Frequency of Communication with Friends and Family: Not on file  . Frequency of Social Gatherings with Friends and Family: Not on file  . Attends Religious Services: Not on file  . Active Member of Clubs or  Organizations: Not on file  . Attends Archivist Meetings: Not on file  . Marital Status: Not on file  Intimate Partner Violence:   . Fear of Current or Ex-Partner: Not on file  . Emotionally Abused: Not on file  . Physically Abused: Not on file  . Sexually Abused: Not on file    Family History:   Family History  Problem Relation Age of Onset  . Coronary artery disease Mother      ROS:  Please see the history of present illness.  All other ROS reviewed and negative.     Physical Exam/Data:   Vitals:   11/26/19 0315 11/26/19 0330 11/26/19 0400 11/26/19 0445  BP: 131/73 136/72 124/66 139/75  Pulse: 70 68 64 72  Resp: 17 17 17 16   Temp:      SpO2: 100% 98% 100% 97%  Weight:      Height:       No intake or output data in the 24 hours ending 11/26/19 0522 Last 3 Weights 11/26/2019 11/25/2019 09/17/2016  Weight (lbs) 180 lb 181 lb 187 lb  Weight (kg) 81.647 kg 82.101 kg 84.823 kg     Body mass index is 27.37 kg/m.  General:  Well nourished, well developed, in no acute distress HEENT: normal Neck: no JVD Cardiac:  normal S1, S2; RRR; no murmur/rubs/gallops Lungs:  clear to auscultation bilaterally, no wheezing, rhonchi or rales  Abd: soft, nontender, no hepatomegaly  Ext: no edema Skin: warm and  dry  Neuro:  CNs 2-12 intact, no focal abnormalities noted Psych:  Normal affect   EKG:  The EKG was personally reviewed and demonstrates:  Sinus rhythm without acute ischemic changes  Relevant CV Studies: N/A  Laboratory Data:  High Sensitivity Troponin:   Recent Labs  Lab 11/26/19 0006 11/26/19 0213  TROPONINIHS 78* 539*     Chemistry Recent Labs  Lab 11/26/19 0006  NA 139  K 4.7  CL 104  CO2 23  GLUCOSE 236*  BUN 20  CREATININE 0.90  CALCIUM 9.2  GFRNONAA >60  GFRAA >60  ANIONGAP 12    No results for input(s): PROT, ALBUMIN, AST, ALT, ALKPHOS, BILITOT in the last 168 hours. Hematology Recent Labs  Lab 11/26/19 0006  WBC 8.6  RBC 3.53*  HGB 10.8*  HCT 33.7*  MCV 95.5  MCH 30.6  MCHC 32.0  RDW 12.9  PLT 499*   BNPNo results for input(s): BNP, PROBNP in the last 168 hours.  DDimer  Recent Labs  Lab 11/26/19 0133  DDIMER 2.13*     Radiology/Studies:  DG Chest 2 View  Result Date: 11/26/2019 CLINICAL DATA:  Chest pain.  Shortness of breath. EXAM: CHEST - 2 VIEW COMPARISON:  07/17/2016 FINDINGS: Heart size upper limits of normal. Patchy bilateral mid and lower lung pulmonary infiltrates most consistent with bronchopneumonia, either bacterial or viral. No evidence of dense consolidation or lobar collapse. No effusions. No acute bone finding. Chronic scoliosis. IMPRESSION: Widespread patchy pulmonary infiltrates most consistent with pneumonia. This could be bacterial or viral. Electronically Signed   By: Nelson Chimes M.D.   On: 11/26/2019 00:58   TIMI Risk Score for Unstable Angina or Non-ST Elevation MI:   The patient's TIMI risk score is 3, which indicates a 13% risk of all cause mortality, new or recurrent myocardial infarction or need for urgent revascularization in the next 14 days.   Assessment and Plan:  Miranda [name] is a 69 year old woman with a history of hypertension, prior tobacco  abuse who presents due to chest pain; found to have NSTEMI.  #  NSTEMI The patient describes escalating chest pain over the last several weeks which culminated in an episode of chest pain at rest on the evening of presentation.  She is now chest pain-free while laying in bed but continues to have chest discomfort with ambulation.  Troponin increased from 78-539.  Her EKG does not show any acute ischemic changes.  In the ED her D-dimer was also elevated and thus a CT PE scan is pending, although given her clinical history and lack of hypoxia, tachycardia suspect that this is less likely.   -Status post aspirin 325, continue aspirin 81 mg daily -Start treatment with atorvastatin 80 mg daily -Lipid panel, A1c -TTE in the morning -Heparin drip for ACS -Likely will require left heart cath on Monday for further evaluation -Please obtain BNP - Her chest x-ray was read as having infiltrate which could be consistent with pneumonia and given the patient's description of the longstanding chest congestion which is lingered, Covid is on the differential as well; test pending - Bed rest while still having chest pressure with any exertion     For questions or updates, please contact [facility] Please consult [email or web address] for contact info under     Signed, Bryna Colander, MD  11/26/2019 5:22 AM

## 2019-11-26 NOTE — Progress Notes (Signed)
ANTICOAGULATION CONSULT NOTE - Initial Consult  Pharmacy Consult for Heparin Indication: chest pain/ACS  No Known Allergies  Patient Measurements: Height: 5\' 8"  (172.7 cm) Weight: 180 lb (81.6 kg) IBW/kg (Calculated) : 63.9  Vital Signs: Temp: 98.2 F (36.8 C) (02/13 2358) BP: 124/66 (02/14 0400) Pulse Rate: 64 (02/14 0400)  Labs: Recent Labs    11/26/19 0006 11/26/19 0213  HGB 10.8*  --   HCT 33.7*  --   PLT 499*  --   CREATININE 0.90  --   TROPONINIHS 78* 539*    Estimated Creatinine Clearance: 67.1 mL/min (by C-G formula based on SCr of 0.9 mg/dL).   Medical History: Past Medical History:  Diagnosis Date  . Hypertension     Medications:  No current facility-administered medications on file prior to encounter.   Current Outpatient Medications on File Prior to Encounter  Medication Sig Dispense Refill  . albuterol (VENTOLIN HFA) 108 (90 Base) MCG/ACT inhaler Inhale 2 puffs into the lungs every 4 (four) hours as needed for wheezing or shortness of breath.     Marland Kitchen amLODipine (NORVASC) 2.5 MG tablet Take 2.5 mg by mouth daily. Pt may take additional dose as needed for HBP    . ascorbic acid (VITAMIN C) 500 MG tablet Take 500 mg by mouth daily.    Marland Kitchen aspirin EC 81 MG tablet Take 81 mg by mouth every other day.    . Chlorphen-Pseudoephed-APAP (THERAFLU FLU/COLD PO) Take 1 Package by mouth daily as needed (cold symptoms).    . colchicine 0.6 MG tablet Take 2 tablets now, then 1 in an hour. Wait 3 days to take this again. 30 tablet 5  . Fluticasone-Salmeterol (ADVAIR) 100-50 MCG/DOSE AEPB Inhale 1 puff into the lungs daily as needed (SOB).     . indomethacin (INDOCIN) 50 MG capsule Take 1 capsule (50 mg total) by mouth 3 (three) times daily with meals. (Patient taking differently: Take 50 mg by mouth 3 (three) times daily as needed for mild pain or moderate pain. ) 30 capsule 6  . Phenylephrine-Aspirin (ALKA-SELTZER PLUS SINUS) 7.8-325 MG TBEF Take 2 tablets by mouth daily  as needed (cold symptoms).    . rosuvastatin (CRESTOR) 20 MG tablet Take 20 mg by mouth daily.     Marland Kitchen telmisartan-hydrochlorothiazide (MICARDIS HCT) 40-12.5 MG tablet Take 1 tablet by mouth daily. Return in 12/17 for refills. 90 tablet 1  . vitamin E (VITAMIN E) 180 MG (400 UNITS) capsule Take 400 Units by mouth daily.    . [DISCONTINUED] cyclobenzaprine (FLEXERIL) 10 MG tablet Take 0.5-1 tablets (5-10 mg total) by mouth 3 (three) times daily as needed for muscle spasms. (Patient not taking: Reported on 09/17/2016) 30 tablet 0    Assessment: 69 y.o. female with chest pain for heparin  Goal of Therapy:  Heparin level 0.3-0.7 units/ml Monitor platelets by anticoagulation protocol: Yes   Plan:  Heparin 4000 units IV bolus, then start heparin 1000 units/hr Check heparin level in 8 hours.   Caryl Pina 11/26/2019,4:09 AM

## 2019-11-27 ENCOUNTER — Encounter (HOSPITAL_COMMUNITY)
Admission: EM | Disposition: A | Discharge status: Home / Self Care | Payer: Self-pay | Source: Home / Self Care | Attending: Cardiothoracic Surgery

## 2019-11-27 ENCOUNTER — Encounter (HOSPITAL_COMMUNITY): Payer: Self-pay | Admitting: Internal Medicine

## 2019-11-27 ENCOUNTER — Other Ambulatory Visit: Payer: Self-pay

## 2019-11-27 DIAGNOSIS — I313 Pericardial effusion (noninflammatory): Secondary | ICD-10-CM

## 2019-11-27 DIAGNOSIS — I2510 Atherosclerotic heart disease of native coronary artery without angina pectoris: Secondary | ICD-10-CM

## 2019-11-27 DIAGNOSIS — I214 Non-ST elevation (NSTEMI) myocardial infarction: Principal | ICD-10-CM

## 2019-11-27 HISTORY — PX: LEFT HEART CATH AND CORONARY ANGIOGRAPHY: CATH118249

## 2019-11-27 LAB — CBC
HCT: 32.3 % — ABNORMAL LOW (ref 36.0–46.0)
Hemoglobin: 10.4 g/dL — ABNORMAL LOW (ref 12.0–15.0)
MCH: 30.5 pg (ref 26.0–34.0)
MCHC: 32.2 g/dL (ref 30.0–36.0)
MCV: 94.7 fL (ref 80.0–100.0)
Platelets: 519 10*3/uL — ABNORMAL HIGH (ref 150–400)
RBC: 3.41 MIL/uL — ABNORMAL LOW (ref 3.87–5.11)
RDW: 12.9 % (ref 11.5–15.5)
WBC: 12.3 10*3/uL — ABNORMAL HIGH (ref 4.0–10.5)
nRBC: 0 % (ref 0.0–0.2)

## 2019-11-27 LAB — BASIC METABOLIC PANEL
Anion gap: 10 (ref 5–15)
BUN: 15 mg/dL (ref 8–23)
CO2: 23 mmol/L (ref 22–32)
Calcium: 9 mg/dL (ref 8.9–10.3)
Chloride: 106 mmol/L (ref 98–111)
Creatinine, Ser: 0.72 mg/dL (ref 0.44–1.00)
GFR calc Af Amer: >60 mL/min (ref >60)
GFR calc non Af Amer: >60 mL/min (ref >60)
Glucose, Bld: 147 mg/dL — ABNORMAL HIGH (ref 70–99)
Potassium: 4 mmol/L (ref 3.5–5.1)
Sodium: 139 mmol/L (ref 135–145)

## 2019-11-27 LAB — HEMOGLOBIN A1C
Hgb A1c MFr Bld: <4.2 % — ABNORMAL LOW (ref 4.8–5.6)
Mean Plasma Glucose: <74 mg/dL

## 2019-11-27 LAB — HEPARIN LEVEL (UNFRACTIONATED): Heparin Unfractionated: 0.76 IU/mL — ABNORMAL HIGH (ref 0.30–0.70)

## 2019-11-27 LAB — GLUCOSE, CAPILLARY
Glucose-Capillary: 118 mg/dL — ABNORMAL HIGH (ref 70–99)
Glucose-Capillary: 197 mg/dL — ABNORMAL HIGH (ref 70–99)
Glucose-Capillary: 82 mg/dL (ref 70–99)
Glucose-Capillary: 127 mg/dL — ABNORMAL HIGH (ref 70–99)

## 2019-11-27 SURGERY — LEFT HEART CATH AND CORONARY ANGIOGRAPHY
Anesthesia: LOCAL

## 2019-11-27 MED ORDER — MIDAZOLAM HCL 2 MG/2ML IJ SOLN
INTRAMUSCULAR | Status: AC
  Filled 2019-11-27: qty 2

## 2019-11-27 MED ORDER — METOPROLOL TARTRATE 12.5 MG HALF TABLET
12.5000 mg | ORAL_TABLET | Freq: Two times a day (BID) | ORAL | Status: DC
  Administered 2019-11-27 – 2019-11-28 (×4): 12.5 mg via ORAL
  Filled 2019-11-27 (×4): qty 1

## 2019-11-27 MED ORDER — HEPARIN (PORCINE) IN NACL 1000-0.9 UT/500ML-% IV SOLN
INTRAVENOUS | Status: DC | PRN
  Administered 2019-11-27 (×2): 500 mL

## 2019-11-27 MED ORDER — SODIUM CHLORIDE 0.9% FLUSH: 3.0000 mL | Freq: Two times a day (BID) | INTRAVENOUS | Status: DC

## 2019-11-27 MED ORDER — SODIUM CHLORIDE 0.9 % WEIGHT BASED INFUSION: 1.0000 mL/kg/h | INTRAVENOUS | Status: DC

## 2019-11-27 MED ORDER — ONDANSETRON HCL 4 MG/2ML IJ SOLN: 4.0000 mg | Freq: Four times a day (QID) | INTRAMUSCULAR | Status: DC | PRN

## 2019-11-27 MED ORDER — HEPARIN (PORCINE) IN NACL 1000-0.9 UT/500ML-% IV SOLN
INTRAVENOUS | Status: AC
  Filled 2019-11-27: qty 1000

## 2019-11-27 MED ORDER — HEPARIN (PORCINE) 25000 UT/250ML-% IV SOLN
1200.0000 [IU]/h | INTRAVENOUS | Status: DC
  Administered 2019-11-27: 1150 [IU]/h via INTRAVENOUS
  Administered 2019-11-28: 1200 [IU]/h via INTRAVENOUS
  Filled 2019-11-27 (×2): qty 250

## 2019-11-27 MED ORDER — SODIUM CHLORIDE 0.9 % WEIGHT BASED INFUSION
1.0000 mL/kg/h | INTRAVENOUS | Status: AC
  Administered 2019-11-28: 1 mL/kg/h via INTRAVENOUS

## 2019-11-27 MED ORDER — HEPARIN SODIUM (PORCINE) 1000 UNIT/ML IJ SOLN
INTRAMUSCULAR | Status: AC
  Filled 2019-11-27: qty 1

## 2019-11-27 MED ORDER — VERAPAMIL HCL 2.5 MG/ML IV SOLN
INTRAVENOUS | Status: DC | PRN
  Administered 2019-11-27: 10 mL via INTRA_ARTERIAL

## 2019-11-27 MED ORDER — SODIUM CHLORIDE 0.9% FLUSH
3.0000 mL | Freq: Two times a day (BID) | INTRAVENOUS | Status: DC
  Administered 2019-11-28: 3 mL via INTRAVENOUS

## 2019-11-27 MED ORDER — SODIUM CHLORIDE 0.9 % IV SOLN: 250.0000 mL | INTRAVENOUS | Status: DC | PRN

## 2019-11-27 MED ORDER — MORPHINE SULFATE (PF) 2 MG/ML IV SOLN
1.0000 mg | INTRAVENOUS | Status: DC | PRN
  Administered 2019-11-27 (×3): 1 mg via INTRAVENOUS
  Filled 2019-11-27 (×4): qty 1

## 2019-11-27 MED ORDER — WHITE PETROLATUM EX OINT
TOPICAL_OINTMENT | CUTANEOUS | Status: DC | PRN
  Filled 2019-11-27: qty 28.35

## 2019-11-27 MED ORDER — FENTANYL CITRATE (PF) 100 MCG/2ML IJ SOLN
INTRAMUSCULAR | Status: AC
  Filled 2019-11-27: qty 2

## 2019-11-27 MED ORDER — SODIUM CHLORIDE 0.9% FLUSH: 3.0000 mL | INTRAVENOUS | Status: DC | PRN

## 2019-11-27 MED ORDER — LIDOCAINE HCL (PF) 1 % IJ SOLN
INTRAMUSCULAR | Status: DC | PRN
  Administered 2019-11-27: 2 mL

## 2019-11-27 MED ORDER — VERAPAMIL HCL 2.5 MG/ML IV SOLN
INTRAVENOUS | Status: AC
  Filled 2019-11-27: qty 2

## 2019-11-27 MED ORDER — GUAIFENESIN 100 MG/5ML PO SOLN
5.0000 mL | ORAL | Status: DC | PRN
  Administered 2019-11-27 – 2019-11-28 (×2): 100 mg via ORAL
  Filled 2019-11-27 (×2): qty 5

## 2019-11-27 MED ORDER — HEPARIN (PORCINE) 25000 UT/250ML-% IV SOLN: 1150.0000 [IU]/h | INTRAVENOUS | Status: DC

## 2019-11-27 MED ORDER — LIDOCAINE HCL (PF) 1 % IJ SOLN
INTRAMUSCULAR | Status: AC
  Filled 2019-11-27: qty 30

## 2019-11-27 MED ORDER — SODIUM CHLORIDE 0.9 % WEIGHT BASED INFUSION
3.0000 mL/kg/h | INTRAVENOUS | Status: DC
  Administered 2019-11-27: 3 mL/kg/h via INTRAVENOUS

## 2019-11-27 MED ORDER — LIP MEDEX EX OINT
TOPICAL_OINTMENT | CUTANEOUS | Status: DC | PRN
  Filled 2019-11-27: qty 7

## 2019-11-27 MED ORDER — IOHEXOL 350 MG/ML SOLN
INTRAVENOUS | Status: DC | PRN
  Administered 2019-11-27: 55 mL

## 2019-11-27 MED ORDER — FENTANYL CITRATE (PF) 100 MCG/2ML IJ SOLN
INTRAMUSCULAR | Status: DC | PRN
  Administered 2019-11-27: 25 ug via INTRAVENOUS

## 2019-11-27 MED ORDER — HEPARIN SODIUM (PORCINE) 1000 UNIT/ML IJ SOLN
INTRAMUSCULAR | Status: DC | PRN
  Administered 2019-11-27: 4000 [IU] via INTRAVENOUS

## 2019-11-27 MED ORDER — MIDAZOLAM HCL 2 MG/2ML IJ SOLN
INTRAMUSCULAR | Status: DC | PRN
  Administered 2019-11-27: 1 mg via INTRAVENOUS

## 2019-11-27 SURGICAL SUPPLY — 9 items
CATH 5FR JL3.5 JR4 ANG PIG MP (CATHETERS) ×1 IMPLANT
DEVICE RAD TR BAND REGULAR (VASCULAR PRODUCTS) ×1 IMPLANT
GLIDESHEATH SLEND SS 6F .021 (SHEATH) ×1 IMPLANT
GUIDEWIRE INQWIRE 1.5J.035X260 (WIRE) IMPLANT
INQWIRE 1.5J .035X260CM (WIRE) ×2
KIT HEART LEFT (KITS) ×2 IMPLANT
PACK CARDIAC CATHETERIZATION (CUSTOM PROCEDURE TRAY) ×2 IMPLANT
TRANSDUCER W/STOPCOCK (MISCELLANEOUS) ×2 IMPLANT
TUBING CIL FLEX 10 FLL-RA (TUBING) ×2 IMPLANT

## 2019-11-27 NOTE — Progress Notes (Signed)
ANTICOAGULATION CONSULT NOTE - Follow Up Consult  Pharmacy Consult for heparin Indication: chest pain/ACS and pulmonary embolus  No Known Allergies  Patient Measurements: Height: 5\' 8"  (172.7 cm) Weight: 180 lb (81.6 kg) IBW/kg (Calculated) : 63.9 Heparin Dosing Weight: 80 kg  Vital Signs: Temp: 98 F (36.7 C) (02/15 0747) Temp Source: Axillary (02/15 0747) BP: 134/95 (02/15 0747) Pulse Rate: 75 (02/15 0747)  Labs: Recent Labs    11/26/19 0006 11/26/19 0213 11/26/19 0718 11/26/19 1117 11/26/19 1246 11/26/19 1523 11/26/19 1915 11/27/19 0214  HGB 10.8*  --   --   --   --   --   --  10.4*  HCT 33.7*  --   --   --   --   --   --  32.3*  PLT 499*  --   --   --   --   --   --  519*  HEPARINUNFRC  --   --   --  0.32  --   --  0.67 0.76*  CREATININE 0.90  --   --   --   --   --   --  0.72  TROPONINIHS 78*   < > 1,786*  --  1,092* 932*  --   --    < > = values in this interval not displayed.    Estimated Creatinine Clearance: 75.4 mL/min (by C-G formula based on SCr of 0.72 mg/dL).   Medications:  Scheduled:  . amLODipine  2.5 mg Oral Daily  . ascorbic acid  500 mg Oral Daily  . aspirin EC  81 mg Oral Daily  . atorvastatin  80 mg Oral q1800  . doxycycline  100 mg Oral Q12H  . insulin aspart  0-5 Units Subcutaneous QHS  . insulin aspart  0-6 Units Subcutaneous TID WC  . mometasone-formoterol  2 puff Inhalation BID  . sodium chloride flush  3 mL Intravenous Once  . vitamin E  400 Units Oral Daily    Assessment: 2 yof presenting to the ED with chest pain found to have NSTEMI and CTA confirmed PE. No anticoagulation PTA.  -heparin level level at goal   Goal of Therapy:  Heparin level 0.3-0.7 units/ml Monitor platelets by anticoagulation protocol: Yes   Plan:  -Decrease heparin to 1150 units/hr -Daily heparin level and CBC  Hildred Laser, PharmD Clinical Pharmacist **Pharmacist phone directory can now be found on [email or web address] ([email or web address]).  Listed under [place].

## 2019-11-27 NOTE — Interval H&P Note (Signed)
History and Physical Interval Note:  11/27/2019 4:37 PM  Miranda [name]  has presented today for surgery, with the diagnosis of chest pain.  The various methods of treatment have been discussed with the patient and family. After consideration of risks, benefits and other options for treatment, the patient has consented to  Procedure(s): LEFT HEART CATH AND CORONARY ANGIOGRAPHY (N/A) as a surgical intervention.  The patient's history has been reviewed, patient examined, no change in status, stable for surgery.  I have reviewed the patient's chart and labs.  Questions were answered to the patient's satisfaction.    Cath Lab Visit (complete for each Cath Lab visit)  Clinical Evaluation Leading to the Procedure:   ACS: Yes.    Non-ACS:    Anginal Classification: CCS II  Anti-ischemic medical therapy: Minimal Therapy (1 class of medications)  Non-Invasive Test Results: No non-invasive testing performed  Prior CABG: No previous CABG       Collier [facility] 11/27/2019 4:37 PM

## 2019-11-27 NOTE — Progress Notes (Signed)
Patient with complaint of 10 of 10 chest pain, administered 0.4mg  nitrostat, 12 lead ekg obtained and physician notified. Orders given will continue to monitor.

## 2019-11-27 NOTE — Progress Notes (Signed)
ANTICOAGULATION CONSULT NOTE - Follow Up Consult  Pharmacy Consult for heparin Indication: chest pain/ACS and pulmonary embolus  No Known Allergies  Patient Measurements: Height: 5\' 8"  (172.7 cm) Weight: 180 lb (81.6 kg) IBW/kg (Calculated) : 63.9 Heparin Dosing Weight: 80 kg  Vital Signs: Temp: 98.1 F (36.7 C) (02/15 1928) Temp Source: Oral (02/15 1928) BP: 126/55 (02/15 1928) Pulse Rate: 63 (02/15 1928)  Labs: Recent Labs    11/26/19 0006 11/26/19 0213 11/26/19 0718 11/26/19 1117 11/26/19 1246 11/26/19 1523 11/26/19 1915 11/27/19 0214  HGB 10.8*  --   --   --   --   --   --  10.4*  HCT 33.7*  --   --   --   --   --   --  32.3*  PLT 499*  --   --   --   --   --   --  519*  HEPARINUNFRC  --   --   --  0.32  --   --  0.67 0.76*  CREATININE 0.90  --   --   --   --   --   --  0.72  TROPONINIHS 78*   < > 1,786*  --  1,092* 932*  --   --    < > = values in this interval not displayed.    Estimated Creatinine Clearance: 75.4 mL/min (by C-G formula based on SCr of 0.72 mg/dL).   Medications:  Scheduled:  . amLODipine  2.5 mg Oral Daily  . ascorbic acid  500 mg Oral Daily  . aspirin EC  81 mg Oral Daily  . atorvastatin  80 mg Oral q1800  . doxycycline  100 mg Oral Q12H  . insulin aspart  0-5 Units Subcutaneous QHS  . insulin aspart  0-6 Units Subcutaneous TID WC  . metoprolol tartrate  12.5 mg Oral BID  . mometasone-formoterol  2 puff Inhalation BID  . sodium chloride flush  3 mL Intravenous Once  . [START ON 11/28/2019] sodium chloride flush  3 mL Intravenous Q12H  . vitamin E  400 Units Oral Daily    Assessment: 74 yof presenting to the ED with chest pain found to have NSTEMI and CTA confirmed PE. No anticoagulation PTA.  -heparin level was 0.76  prior to cardiac cath on heparin drip rate 1200 units/hr, thus heparin rate decreased to 1150 units/hr to keep at goal 0.3-0.7 units/ml.   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   TCTS consulted for CABG. Pharmacy consulted to resume heparin 1hr after TR band off & no bleeding, no bolus.  At 21:52 RN reports she is withdrawing last 2 cc and removing TR band ~ 22:00. No bleeding per RN's report. This RN will notify pharmacist if any issues with TR band removal or any bleeding noted.   Goal of Therapy:  Heparin level 0.3-0.7 units/ml Monitor platelets by anticoagulation protocol: Yes   Plan:  -1 hour after TR band removed , restart heparin drip at 23:00 tonight at previous rate 1150 units/hr, no bolus -Daily heparin level and CBC  Nicole Cella, RPh Clinical Pharmacist **Pharmacist phone directory can now be found on [email or web address] ([email or web address]).  Listed under [place].  11/27/2019 10:04 PM

## 2019-11-27 NOTE — Progress Notes (Signed)
Spoke with patient concerning cardiac catheterization and patient is agreeable at this time. Would like to find out before she goes home. I will make cardiology aware. Pt resting with call bell within reach.  Will continue to monitor.

## 2019-11-27 NOTE — TOC Benefit Eligibility Note (Signed)
Transition of Care [facility]) Benefit Eligibility Note    Patient Details  Name: Miranda [name] MRN: [id number] Date of Birth: May 27, 1951   Medication/Dose: Arne Cleveland  5 MG BID  Covered?: Yes  Tier: 2 Drug  Prescription Coverage Preferred Pharmacy: [facility]  Spoke with Person/Company/Phone Number:: [facility]   @ [employer] RX # [phone]  Co-Pay: $ 40.00  Prior Approval: No  Deductible: Unmet(PATIENT IN INITIAL COVERAGE)  Additional Notes: XARELTO 20 MG DAILY . COVER-YES, CO-PAY- $40.00, TIER- 2 DRUG, P/A-NO    Memory Argue Phone Number: 11/27/2019, 10:30 AM

## 2019-11-27 NOTE — Progress Notes (Addendum)
TCTS BRIEF PROGRESS NOTE  Day of Surgery  S/P Procedure(s) (LRB): LEFT HEART CATH AND CORONARY ANGIOGRAPHY (N/A)   Patient seen and examined, cath films reviewed.  Full consult note to follow.  At present patient denies any chest pain or SOB.  Will f/u tomorrow to discuss timing of CABG.  All questions answered.   Rexene Alberts, MD 11/27/2019 6:10 PM

## 2019-11-27 NOTE — Progress Notes (Signed)
    Called by RN informing that patient is agreeable to undergo cardiac cath. She ordered lunch but has not eaten yet. Will ask that she remain NPO and plan for cath this afternoon.   The patient understands that risks included but are not limited to stroke (1 in 1000), death (1 in 76), kidney failure [usually temporary] (1 in 500), bleeding (1 in 200), allergic reaction [possibly serious] (1 in 200).   Will place cath orders.   SignedReino Bellis, NP-C 11/27/2019, 1:44 PM Pager: [phone]

## 2019-11-27 NOTE — H&P (View-Only) (Signed)
Progress Note  Patient Name: Miranda [name] Date of Encounter: 11/27/2019  Primary Cardiologist: No primary care provider on file.   Subjective   Had an episode of chest pain this morning while getting up, relieved with morphine.   Inpatient Medications    Scheduled Meds: . amLODipine  2.5 mg Oral Daily  . ascorbic acid  500 mg Oral Daily  . aspirin EC  81 mg Oral Daily  . atorvastatin  80 mg Oral q1800  . doxycycline  100 mg Oral Q12H  . insulin aspart  0-5 Units Subcutaneous QHS  . insulin aspart  0-6 Units Subcutaneous TID WC  . mometasone-formoterol  2 puff Inhalation BID  . sodium chloride flush  3 mL Intravenous Once  . vitamin E  400 Units Oral Daily   Continuous Infusions: . heparin 1,200 Units/hr (11/26/19 2244)   PRN Meds: acetaminophen, albuterol, guaiFENesin, morphine injection, nitroGLYCERIN   Vital Signs    Vitals:   11/26/19 1936 11/27/19 0000 11/27/19 0415 11/27/19 0747  BP: 126/65 123/69 121/63 (!) 134/95  Pulse: 72 61 66 75  Resp: 20 19 15 12   Temp: 98.5 F (36.9 C) 98.2 F (36.8 C) 98.3 F (36.8 C) 98 F (36.7 C)  TempSrc: Oral Oral Oral Axillary  SpO2: 99% 96% 99% 100%  Weight:      Height:        Intake/Output Summary (Last 24 hours) at 11/27/2019 0859 Last data filed at 11/27/2019 0507 Gross per 24 hour  Intake 120 ml  Output 3 ml  Net 117 ml   Last 3 Weights 11/26/2019 11/25/2019 09/17/2016  Weight (lbs) 180 lb 181 lb 187 lb  Weight (kg) 81.647 kg 82.101 kg 84.823 kg      Telemetry    SR - Personally Reviewed  ECG    SR with new TWI in anterior leads - Personally Reviewed  Physical Exam  Pleasant older AAF, sitting up in bed GEN: No acute distress.   Neck: No JVD Cardiac: RRR, no murmurs, rubs, or gallops.  Respiratory: Clear to auscultation bilaterally. GI: Soft, nontender, non-distended  MS: No edema; No deformity. Neuro:  Nonfocal  Psych: Normal affect   Labs    High Sensitivity Troponin:   Recent Labs  Lab  11/26/19 0213 11/26/19 0607 11/26/19 0718 11/26/19 1246 11/26/19 1523  TROPONINIHS 539* 1,555* 1,786* 1,092* 932*      Chemistry Recent Labs  Lab 11/26/19 0006 11/27/19 0214  NA 139 139  K 4.7 4.0  CL 104 106  CO2 23 23  GLUCOSE 236* 147*  BUN 20 15  CREATININE 0.90 0.72  CALCIUM 9.2 9.0  GFRNONAA >60 >60  GFRAA >60 >60  ANIONGAP 12 10     Hematology Recent Labs  Lab 11/26/19 0006 11/27/19 0214  WBC 8.6 12.3*  RBC 3.53* 3.41*  HGB 10.8* 10.4*  HCT 33.7* 32.3*  MCV 95.5 94.7  MCH 30.6 30.5  MCHC 32.0 32.2  RDW 12.9 12.9  PLT 499* 519*    BNP Recent Labs  Lab 11/26/19 0607  BNP 135.8*     DDimer  Recent Labs  Lab 11/26/19 0133 11/26/19 0607  DDIMER 2.13* 1.97*     Radiology    DG Chest 2 View  Result Date: 11/26/2019 CLINICAL DATA:  Chest pain.  Shortness of breath. EXAM: CHEST - 2 VIEW COMPARISON:  07/17/2016 FINDINGS: Heart size upper limits of normal. Patchy bilateral mid and lower lung pulmonary infiltrates most consistent with bronchopneumonia, either bacterial or viral.  No evidence of dense consolidation or lobar collapse. No effusions. No acute bone finding. Chronic scoliosis. IMPRESSION: Widespread patchy pulmonary infiltrates most consistent with pneumonia. This could be bacterial or viral. Electronically Signed   By: Nelson Chimes M.D.   On: 11/26/2019 00:58   CT Angio Chest PE W and/or Wo Contrast  Result Date: 11/26/2019 CLINICAL DATA:  Low to intermediate probability for pulmonary embolus. Positive D-dimer. Chest pain and SOB. EXAM: CT ANGIOGRAPHY CHEST WITH CONTRAST TECHNIQUE: Multidetector CT imaging of the chest was performed using the standard protocol during bolus administration of intravenous contrast. Multiplanar CT image reconstructions and MIPs were obtained to evaluate the vascular anatomy. CONTRAST:  67mL OMNIPAQUE IOHEXOL 350 MG/ML SOLN COMPARISON:  None. FINDINGS: Cardiovascular: Filling defect within a segmental branch of the  posterior right lower lobe pulmonary artery is noted compatible with pulmonary embolus, image 243/7. No additional pulmonary emboli identified. Mild cardiac enlargement. No pericardial effusion. Three vessel coronary artery atherosclerotic calcifications. Aortic atherosclerosis Mediastinum/Nodes: Normal appearance of the thyroid gland. The trachea appears patent and is midline. Small hiatal hernia. No adenopathy identified. Lungs/Pleura: There is a mosaic attenuation pattern identified bilaterally. Bilateral multifocal areas of peripheral predominant patchy and nodular areas of ground-glass attenuate and airspace consolidation is identified involving the upper and lower lobes. Scar like densities are noted within both upper lobes. Upper Abdomen: No acute abnormality. Musculoskeletal: No chest wall abnormality. No acute or significant osseous findings. Review of the MIP images confirms the above findings. IMPRESSION: 1. Examination is positive for acute pulmonary embolus within a segmental branch of the posterior right lower lobe pulmonary artery. 2. Bilateral multifocal areas of ground-glass attenuate and nodular attenuates the upper and lower lobes. Findings are favored to represent multifocal pneumonia. Follow-up imaging is advised in 3-4 weeks following trial of antibiotic therapy to ensure resolution and exclude underlying malignancy. 3. Multi vessel coronary artery atherosclerotic calcifications. 4. Hiatal hernia. 5. Critical Value/emergent results were called by telephone at the time of interpretation on 11/26/2019 at 7:05 am to provider Delora Fuel, who verbally acknowledged these results. Aortic Atherosclerosis (ICD10-I70.0). Electronically Signed   By: Kerby Moors M.D.   On: 11/26/2019 07:06   ECHOCARDIOGRAM COMPLETE  Result Date: 11/26/2019    ECHOCARDIOGRAM REPORT   Patient Name:   Miranda [name] [facility] Date of Exam: 11/26/2019 Medical Rec #:  [id number]       Height:       68.0 in Accession #:     [id number]      Weight:       180.0 lb Date of Birth:  [birth year]        BSA:          1.95 m Patient Age:    69 years        BP:           130/85 mmHg Patient Gender: F               HR:           89 bpm. Exam Location:  Inpatient Procedure: 2D Echo Indications:    chest pain 786.50  History:        Patient has no prior history of Echocardiogram examinations.                 COPD, Signs/Symptoms:Chest Pain; Risk Factors:Hypertension and                 Dyslipidemia.  Sonographer:    Johny Chess Referring Phys: [id number]  [place] IMPRESSIONS  1. Left ventricular ejection fraction, by estimation, is 50 to 55%. The left ventricle has low normal function. The left ventricle demonstrates regional wall motion abnormalities (see scoring diagram/findings for description). Left ventricular diastolic  parameters are consistent with Grade I diastolic dysfunction (impaired relaxation).  2. Right ventricular systolic function is normal. The right ventricular size is normal. There is normal pulmonary artery systolic pressure. The estimated right ventricular systolic pressure is 123XX123 mmHg.  3. The mitral valve is grossly normal. Mild mitral valve regurgitation.  4. The aortic valve is tricuspid. Aortic valve regurgitation is not visualized. No aortic stenosis is present.  5. The inferior vena cava is normal in size with greater than 50% respiratory variability, suggesting right atrial pressure of 3 mmHg. Comparison(s): No prior Echocardiogram. FINDINGS  Left Ventricle: Left ventricular ejection fraction, by estimation, is 50 to 55%. The left ventricle has low normal function. The left ventricle demonstrates regional wall motion abnormalities. The left ventricular internal cavity size was normal in size. There is no left ventricular hypertrophy. Left ventricular diastolic parameters are consistent with Grade I diastolic dysfunction (impaired relaxation). Normal left ventricular filling pressure.  LV Wall Scoring: The  posterior wall is hypokinetic. Right Ventricle: The right ventricular size is normal. No increase in right ventricular wall thickness. Right ventricular systolic function is normal. There is normal pulmonary artery systolic pressure. The tricuspid regurgitant velocity is 2.69 m/s, and  with an assumed right atrial pressure of 3 mmHg, the estimated right ventricular systolic pressure is 123XX123 mmHg. Left Atrium: Left atrial size was normal in size. Right Atrium: Right atrial size was normal in size. Pericardium: There is no evidence of pericardial effusion. Mitral Valve: The mitral valve is grossly normal. Mild mitral valve regurgitation. Tricuspid Valve: The tricuspid valve is grossly normal. Tricuspid valve regurgitation is mild. Aortic Valve: The aortic valve is tricuspid. Aortic valve regurgitation is not visualized. No aortic stenosis is present. Pulmonic Valve: The pulmonic valve was grossly normal. Pulmonic valve regurgitation is not visualized. Aorta: The aortic root is normal in size and structure. Venous: The right upper pulmonary vein is normal. The inferior vena cava is normal in size with greater than 50% respiratory variability, suggesting right atrial pressure of 3 mmHg. IAS/Shunts: No atrial level shunt detected by color flow Doppler.  LEFT VENTRICLE PLAX 2D LVIDd:         4.50 cm  Diastology LVIDs:         3.40 cm  LV e' lateral:   9.68 cm/s LV PW:         0.90 cm  LV E/e' lateral: 12.8 LV IVS:        0.80 cm  LV e' medial:    6.74 cm/s LVOT diam:     1.80 cm  LV E/e' medial:  18.4 LV SV:         68.71 ml LV SV Index:   22.55 LVOT Area:     2.54 cm  RIGHT VENTRICLE RV S prime:     12.90 cm/s TAPSE (M-mode): 2.1 cm LEFT ATRIUM             Index       RIGHT ATRIUM           Index LA diam:        3.80 cm 1.94 cm/m  RA Area:     12.80 cm LA Vol (A2C):   48.1 ml 24.61 ml/m RA Volume:   32.20 ml  16.48 ml/m LA  Vol (A4C):   49.6 ml 25.38 ml/m LA Biplane Vol: 48.9 ml 25.02 ml/m  AORTIC VALVE LVOT Vmax:    136.00 cm/s LVOT Vmean:  82.700 cm/s LVOT VTI:    0.270 m  AORTA Ao Root diam: 2.90 cm MITRAL VALVE                TRICUSPID VALVE MV Area (PHT): 4.63 cm     TR Peak grad:   28.9 mmHg MV Decel Time: 164 msec     TR Vmax:        269.00 cm/s MV E velocity: 124.00 cm/s MV A velocity: 155.00 cm/s  SHUNTS MV E/A ratio:  0.80         Systemic VTI:  0.27 m                             Systemic Diam: 1.80 cm Eleonore Chiquito MD Electronically signed by Eleonore Chiquito MD Signature Date/Time: 11/26/2019/12:33:37 PM    Final     Cardiac Studies   TTE: 11/26/19  IMPRESSIONS    1. Left ventricular ejection fraction, by estimation, is 50 to 55%. The  left ventricle has low normal function. The left ventricle demonstrates  regional wall motion abnormalities (see scoring diagram/findings for  description). Left ventricular diastolic  parameters are consistent with Grade I diastolic dysfunction (impaired  relaxation).  2. Right ventricular systolic function is normal. The right ventricular  size is normal. There is normal pulmonary artery systolic pressure. The  estimated right ventricular systolic pressure is 123XX123 mmHg.  3. The mitral valve is grossly normal. Mild mitral valve regurgitation.  4. The aortic valve is tricuspid. Aortic valve regurgitation is not  visualized. No aortic stenosis is present.  5. The inferior vena cava is normal in size with greater than 50%  respiratory variability, suggesting right atrial pressure of 3 mmHg.   Patient Profile     69 y.o. female with a hx of HTN who was seen for the evaluation of chest pain at the request of Dr. Roxanne Mins.  Assessment & Plan    1. NSTEMI: hsTn peaked at 1786. Initially consulted regarding her chest pain and NSTEMI, but found to have acute PE. Did have an episode of chest pain earlier this morning. Does have new TWI in anterior leads on EKG.  Echo with normal EF with hypokinesis in the posterior wall. 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 Reports mother had MI in her 56s. Will review further testing with MD given her acute state.  -- remains on IV heparin, asa, statin. Add low dose metoprolol  2. Acute PE: noted on CT scan. Ddimer was around 2. No strain noted on echo. Does report hx of blood clot in the past while on birth control. 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   3. PNA: CT chest with multifocal areas. COVID was negative. Antibiotics per primary.   4. HTN: stable with current therapy  5. HL: on statin, Crestor 20mg  daily. LDL above goal. Was switched to atorvastatin 80mg  on admission.   For questions or updates, please contact [place] Please consult [email or web address] for contact info under   Signed, Reino Bellis, NP  11/27/2019, 8:59 AM    I have personally seen and examined this patient. I agree with the assessment and plan as outlined above.  She is admitted with chest pain and  is found to have pneumonia and a RLL PE. She also has elevated troponin, ischemic EKG changes and a wall motion abnormality on echo. This is concerning for ACS, despite the fact that she has pneumonia and a PE. She continues to have resting chest pain over the last 24 hours. While she is on IV heparin, it may be the best time to proceed with a cardiac cath. She will likely need long term anti-coagulation given the finding of the PE.  I think a cardiac cath is indicated prior to discharge. I have recommended a cath but she wishes to think more about this. I think it is reasonable to continue therapy for her pneumonia today. Continue IV heparin. We will follow up tomorrow and discuss a cardiac cath again.   Lauree [name] 11/27/2019 10:27 AM

## 2019-11-27 NOTE — Progress Notes (Addendum)
Progress Note  Patient Name: Miranda [name] Date of Encounter: 11/27/2019  Primary Cardiologist: No primary care provider on file.   Subjective   Had an episode of chest pain this morning while getting up, relieved with morphine.   Inpatient Medications    Scheduled Meds: . amLODipine  2.5 mg Oral Daily  . ascorbic acid  500 mg Oral Daily  . aspirin EC  81 mg Oral Daily  . atorvastatin  80 mg Oral q1800  . doxycycline  100 mg Oral Q12H  . insulin aspart  0-5 Units Subcutaneous QHS  . insulin aspart  0-6 Units Subcutaneous TID WC  . mometasone-formoterol  2 puff Inhalation BID  . sodium chloride flush  3 mL Intravenous Once  . vitamin E  400 Units Oral Daily   Continuous Infusions: . heparin 1,200 Units/hr (11/26/19 2244)   PRN Meds: acetaminophen, albuterol, guaiFENesin, morphine injection, nitroGLYCERIN   Vital Signs    Vitals:   11/26/19 1936 11/27/19 0000 11/27/19 0415 11/27/19 0747  BP: 126/65 123/69 121/63 (!) 134/95  Pulse: 72 61 66 75  Resp: 20 19 15 12   Temp: 98.5 F (36.9 C) 98.2 F (36.8 C) 98.3 F (36.8 C) 98 F (36.7 C)  TempSrc: Oral Oral Oral Axillary  SpO2: 99% 96% 99% 100%  Weight:      Height:        Intake/Output Summary (Last 24 hours) at 11/27/2019 0859 Last data filed at 11/27/2019 0507 Gross per 24 hour  Intake 120 ml  Output 3 ml  Net 117 ml   Last 3 Weights 11/26/2019 11/25/2019 09/17/2016  Weight (lbs) 180 lb 181 lb 187 lb  Weight (kg) 81.647 kg 82.101 kg 84.823 kg      Telemetry    SR - Personally Reviewed  ECG    SR with new TWI in anterior leads - Personally Reviewed  Physical Exam  Pleasant older AAF, sitting up in bed GEN: No acute distress.   Neck: No JVD Cardiac: RRR, no murmurs, rubs, or gallops.  Respiratory: Clear to auscultation bilaterally. GI: Soft, nontender, non-distended  MS: No edema; No deformity. Neuro:  Nonfocal  Psych: Normal affect   Labs    High Sensitivity Troponin:   Recent Labs  Lab  11/26/19 0213 11/26/19 0607 11/26/19 0718 11/26/19 1246 11/26/19 1523  TROPONINIHS 539* 1,555* 1,786* 1,092* 932*      Chemistry Recent Labs  Lab 11/26/19 0006 11/27/19 0214  NA 139 139  K 4.7 4.0  CL 104 106  CO2 23 23  GLUCOSE 236* 147*  BUN 20 15  CREATININE 0.90 0.72  CALCIUM 9.2 9.0  GFRNONAA >60 >60  GFRAA >60 >60  ANIONGAP 12 10     Hematology Recent Labs  Lab 11/26/19 0006 11/27/19 0214  WBC 8.6 12.3*  RBC 3.53* 3.41*  HGB 10.8* 10.4*  HCT 33.7* 32.3*  MCV 95.5 94.7  MCH 30.6 30.5  MCHC 32.0 32.2  RDW 12.9 12.9  PLT 499* 519*    BNP Recent Labs  Lab 11/26/19 0607  BNP 135.8*     DDimer  Recent Labs  Lab 11/26/19 0133 11/26/19 0607  DDIMER 2.13* 1.97*     Radiology    DG Chest 2 View  Result Date: 11/26/2019 CLINICAL DATA:  Chest pain.  Shortness of breath. EXAM: CHEST - 2 VIEW COMPARISON:  07/17/2016 FINDINGS: Heart size upper limits of normal. Patchy bilateral mid and lower lung pulmonary infiltrates most consistent with bronchopneumonia, either bacterial or viral.  No evidence of dense consolidation or lobar collapse. No effusions. No acute bone finding. Chronic scoliosis. IMPRESSION: Widespread patchy pulmonary infiltrates most consistent with pneumonia. This could be bacterial or viral. Electronically Signed   By: Nelson Chimes M.D.   On: 11/26/2019 00:58   CT Angio Chest PE W and/or Wo Contrast  Result Date: 11/26/2019 CLINICAL DATA:  Low to intermediate probability for pulmonary embolus. Positive D-dimer. Chest pain and SOB. EXAM: CT ANGIOGRAPHY CHEST WITH CONTRAST TECHNIQUE: Multidetector CT imaging of the chest was performed using the standard protocol during bolus administration of intravenous contrast. Multiplanar CT image reconstructions and MIPs were obtained to evaluate the vascular anatomy. CONTRAST:  41mL OMNIPAQUE IOHEXOL 350 MG/ML SOLN COMPARISON:  None. FINDINGS: Cardiovascular: Filling defect within a segmental branch of the  posterior right lower lobe pulmonary artery is noted compatible with pulmonary embolus, image 243/7. No additional pulmonary emboli identified. Mild cardiac enlargement. No pericardial effusion. Three vessel coronary artery atherosclerotic calcifications. Aortic atherosclerosis Mediastinum/Nodes: Normal appearance of the thyroid gland. The trachea appears patent and is midline. Small hiatal hernia. No adenopathy identified. Lungs/Pleura: There is a mosaic attenuation pattern identified bilaterally. Bilateral multifocal areas of peripheral predominant patchy and nodular areas of ground-glass attenuate and airspace consolidation is identified involving the upper and lower lobes. Scar like densities are noted within both upper lobes. Upper Abdomen: No acute abnormality. Musculoskeletal: No chest wall abnormality. No acute or significant osseous findings. Review of the MIP images confirms the above findings. IMPRESSION: 1. Examination is positive for acute pulmonary embolus within a segmental branch of the posterior right lower lobe pulmonary artery. 2. Bilateral multifocal areas of ground-glass attenuate and nodular attenuates the upper and lower lobes. Findings are favored to represent multifocal pneumonia. Follow-up imaging is advised in 3-4 weeks following trial of antibiotic therapy to ensure resolution and exclude underlying malignancy. 3. Multi vessel coronary artery atherosclerotic calcifications. 4. Hiatal hernia. 5. Critical Value/emergent results were called by telephone at the time of interpretation on 11/26/2019 at 7:05 am to provider Delora Fuel, who verbally acknowledged these results. Aortic Atherosclerosis (ICD10-I70.0). Electronically Signed   By: Kerby Moors M.D.   On: 11/26/2019 07:06   ECHOCARDIOGRAM COMPLETE  Result Date: 11/26/2019    ECHOCARDIOGRAM REPORT   Patient Name:   Miranda [name] [facility] Date of Exam: 11/26/2019 Medical Rec #:  [id number]       Height:       68.0 in Accession #:     [id number]      Weight:       180.0 lb Date of Birth:  1951/04/21        BSA:          1.95 m Patient Age:    69 years        BP:           130/85 mmHg Patient Gender: F               HR:           89 bpm. Exam Location:  Inpatient Procedure: 2D Echo Indications:    chest pain 786.50  History:        Patient has no prior history of Echocardiogram examinations.                 COPD, Signs/Symptoms:Chest Pain; Risk Factors:Hypertension and                 Dyslipidemia.  Sonographer:    Johny Chess Referring Phys: [id number]  [place] IMPRESSIONS  1. Left ventricular ejection fraction, by estimation, is 50 to 55%. The left ventricle has low normal function. The left ventricle demonstrates regional wall motion abnormalities (see scoring diagram/findings for description). Left ventricular diastolic  parameters are consistent with Grade I diastolic dysfunction (impaired relaxation).  2. Right ventricular systolic function is normal. The right ventricular size is normal. There is normal pulmonary artery systolic pressure. The estimated right ventricular systolic pressure is 123XX123 mmHg.  3. The mitral valve is grossly normal. Mild mitral valve regurgitation.  4. The aortic valve is tricuspid. Aortic valve regurgitation is not visualized. No aortic stenosis is present.  5. The inferior vena cava is normal in size with greater than 50% respiratory variability, suggesting right atrial pressure of 3 mmHg. Comparison(s): No prior Echocardiogram. FINDINGS  Left Ventricle: Left ventricular ejection fraction, by estimation, is 50 to 55%. The left ventricle has low normal function. The left ventricle demonstrates regional wall motion abnormalities. The left ventricular internal cavity size was normal in size. There is no left ventricular hypertrophy. Left ventricular diastolic parameters are consistent with Grade I diastolic dysfunction (impaired relaxation). Normal left ventricular filling pressure.  LV Wall Scoring: The  posterior wall is hypokinetic. Right Ventricle: The right ventricular size is normal. No increase in right ventricular wall thickness. Right ventricular systolic function is normal. There is normal pulmonary artery systolic pressure. The tricuspid regurgitant velocity is 2.69 m/s, and  with an assumed right atrial pressure of 3 mmHg, the estimated right ventricular systolic pressure is 123XX123 mmHg. Left Atrium: Left atrial size was normal in size. Right Atrium: Right atrial size was normal in size. Pericardium: There is no evidence of pericardial effusion. Mitral Valve: The mitral valve is grossly normal. Mild mitral valve regurgitation. Tricuspid Valve: The tricuspid valve is grossly normal. Tricuspid valve regurgitation is mild. Aortic Valve: The aortic valve is tricuspid. Aortic valve regurgitation is not visualized. No aortic stenosis is present. Pulmonic Valve: The pulmonic valve was grossly normal. Pulmonic valve regurgitation is not visualized. Aorta: The aortic root is normal in size and structure. Venous: The right upper pulmonary vein is normal. The inferior vena cava is normal in size with greater than 50% respiratory variability, suggesting right atrial pressure of 3 mmHg. IAS/Shunts: No atrial level shunt detected by color flow Doppler.  LEFT VENTRICLE PLAX 2D LVIDd:         4.50 cm  Diastology LVIDs:         3.40 cm  LV e' lateral:   9.68 cm/s LV PW:         0.90 cm  LV E/e' lateral: 12.8 LV IVS:        0.80 cm  LV e' medial:    6.74 cm/s LVOT diam:     1.80 cm  LV E/e' medial:  18.4 LV SV:         68.71 ml LV SV Index:   22.55 LVOT Area:     2.54 cm  RIGHT VENTRICLE RV S prime:     12.90 cm/s TAPSE (M-mode): 2.1 cm LEFT ATRIUM             Index       RIGHT ATRIUM           Index LA diam:        3.80 cm 1.94 cm/m  RA Area:     12.80 cm LA Vol (A2C):   48.1 ml 24.61 ml/m RA Volume:   32.20 ml  16.48 ml/m LA  Vol (A4C):   49.6 ml 25.38 ml/m LA Biplane Vol: 48.9 ml 25.02 ml/m  AORTIC VALVE LVOT Vmax:    136.00 cm/s LVOT Vmean:  82.700 cm/s LVOT VTI:    0.270 m  AORTA Ao Root diam: 2.90 cm MITRAL VALVE                TRICUSPID VALVE MV Area (PHT): 4.63 cm     TR Peak grad:   28.9 mmHg MV Decel Time: 164 msec     TR Vmax:        269.00 cm/s MV E velocity: 124.00 cm/s MV A velocity: 155.00 cm/s  SHUNTS MV E/A ratio:  0.80         Systemic VTI:  0.27 m                             Systemic Diam: 1.80 cm Eleonore Chiquito MD Electronically signed by Eleonore Chiquito MD Signature Date/Time: 11/26/2019/12:33:37 PM    Final     Cardiac Studies   TTE: 11/26/19  IMPRESSIONS    1. Left ventricular ejection fraction, by estimation, is 50 to 55%. The  left ventricle has low normal function. The left ventricle demonstrates  regional wall motion abnormalities (see scoring diagram/findings for  description). Left ventricular diastolic  parameters are consistent with Grade I diastolic dysfunction (impaired  relaxation).  2. Right ventricular systolic function is normal. The right ventricular  size is normal. There is normal pulmonary artery systolic pressure. The  estimated right ventricular systolic pressure is 123XX123 mmHg.  3. The mitral valve is grossly normal. Mild mitral valve regurgitation.  4. The aortic valve is tricuspid. Aortic valve regurgitation is not  visualized. No aortic stenosis is present.  5. The inferior vena cava is normal in size with greater than 50%  respiratory variability, suggesting right atrial pressure of 3 mmHg.   Patient Profile     69 y.o. female with a hx of HTN who was seen for the evaluation of chest pain at the request of Dr. Roxanne Mins.  Assessment & Plan    1. NSTEMI: hsTn peaked at 1786. Initially consulted regarding her chest pain and NSTEMI, but found to have acute PE. Did have an episode of chest pain earlier this morning. Does have new TWI in anterior leads on EKG.  Echo with normal EF with hypokinesis in the posterior wall. 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 Reports mother had MI in her 80s. Will review further testing with MD given her acute state.  -- remains on IV heparin, asa, statin. Add low dose metoprolol  2. Acute PE: noted on CT scan. Ddimer was around 2. No strain noted on echo. Does report hx of blood clot in the past while on birth control. 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   3. PNA: CT chest with multifocal areas. COVID was negative. Antibiotics per primary.   4. HTN: stable with current therapy  5. HL: on statin, Crestor 20mg  daily. LDL above goal. Was switched to atorvastatin 80mg  on admission.   For questions or updates, please contact [place] Please consult [email or web address] for contact info under   Signed, Reino Bellis, NP  11/27/2019, 8:59 AM    I have personally seen and examined this patient. I agree with the assessment and plan as outlined above.  She is admitted with chest pain and  is found to have pneumonia and a RLL PE. She also has elevated troponin, ischemic EKG changes and a wall motion abnormality on echo. This is concerning for ACS, despite the fact that she has pneumonia and a PE. She continues to have resting chest pain over the last 24 hours. While she is on IV heparin, it may be the best time to proceed with a cardiac cath. She will likely need long term anti-coagulation given the finding of the PE.  I think a cardiac cath is indicated prior to discharge. I have recommended a cath but she wishes to think more about this. I think it is reasonable to continue therapy for her pneumonia today. Continue IV heparin. We will follow up tomorrow and discuss a cardiac cath again.   Lauree [name] 11/27/2019 10:27 AM

## 2019-11-27 NOTE — Progress Notes (Signed)
PROGRESS NOTE    Miranda [name]  [id number] DOB: Jun 24, 1951 DOA: 11/25/2019 PCP: Boyce Medici, FNP     Brief Narrative:  Miranda Estudillo Wimbishis a 69 y.o.femalewith medical history significant ofhypertension, hyperlipidemia, COPD, gout who presented to the ED with complaints of chest pain.Patient states after returning home from work, she experienced chest pain while walking at home. The chest pain was left-sided, pressure-like, and associated with shortness of breath. She then sat down and symptoms improved. When she got up again to walk the chest pain started again and was more severe this time. It was now associated with nausea and diaphoresis. She then rested again but continued to have symptoms. States she has had episodes of exertional chest pain for several months. Reports having productive cough and congestion for the past 1 week. No fevers or sick contacts. She was previously smoking cigarettes but quit several years ago. States her mother had a massive heart attack in her 11s. Work up in the ED with initial high-sensitivity troponin 78, repeat significantly elevated at 539. EKG without acute ischemic changes. D-dimer elevated at 2.13. Chest x-ray showing widespread patchy pulmonary infiltrates consistent with pneumonia. SARS-CoV-2 PCR test negative. CTA chest showed multifocal pneumonia as well as RLL PE. Patient admitted for NSTEMI, cardiology consulted.   New events last 24 hours / Subjective: Had 2 episodes of chest pain overnight.  Once at rest, once with exertion to get up to the bathroom.  Both episodes resolved with sublingual nitro.  This morning, remains chest pain-free.  Denies any shortness of breath.  Has a dry cough  Assessment & Plan:   Principal Problem:   NSTEMI (non-ST elevated myocardial infarction) ([place]) Active Problems:   Hypertension   Multifocal pneumonia   Hyperlipidemia   Anemia   NSTEMI -Troponin 78 --> 539 --> 1555 --> 1786 --> 1092 -->  932 -Echocardiogram  with EF 50 to [id number], grade 1 diastolic dysfunction, demonstrated regional wall motion abnormalities  -Cardiology following, has recommended heart cath -Heparin drip -Aspirin, Lipitor, lopressor   Right lower lobe PE -CTA chest: positive for acute pulmonary embolus within a segmental branch of the posterior right lower lobe pulmonary artery. -Patient states that she had a blood clot decades ago while she was on birth control. At that time, she remained on Coumadin for several months. 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 Findings are favored to represent multifocal pneumonia. Follow-up imaging is advised in 3-4 weeks following trial of antibiotic therapy to ensure resolution and exclude underlying malignancy. -Patient has had some productive cough for the past week without any fevers, symptoms have been improving -Continue doxycycline -Robitussin PRN   Hypertension -Continue amlodipine  Hyperlipidemia -Continue Lipitor   DVT prophylaxis: IV heparin Code Status: Full code Family Communication: None at bedside Disposition Plan:  . Patient is from home prior to admission. . Currently in-hospital treatment needed due to further treatment for PE, pneumonia as well as cardiac work-up.  Remains on IV heparin. . Suspect patient will discharge home in 2 to 3 days.    Consultants:   Cardiology  Procedures:   None  Antimicrobials:  Anti-infectives (From admission, onward)   Start     Dose/Rate Route Frequency Ordered Stop   11/26/19 1000  doxycycline (VIBRA-TABS) tablet  100 mg     100 mg Oral Every 12 hours 11/26/19 0751 12/01/19 0959   11/26/19 0115  doxycycline (VIBRA-TABS) tablet 100  mg     100 mg Oral  Once 11/26/19 0113 11/26/19 0130        Objective: Vitals:   11/27/19 0415 11/27/19 0747 11/27/19 1009 11/27/19 1140  BP: 121/63 (!) 134/95 (!) 130/91 123/65  Pulse: 66 75 71 (!) 54  Resp: 15 12  20   Temp: 98.3 F (36.8 C) 98 F (36.7 C)  98.2 F (36.8 C)  TempSrc: Oral Axillary  Oral  SpO2: 99% 100%    Weight:      Height:        Intake/Output Summary (Last 24 hours) at 11/27/2019 1258 Last data filed at 11/27/2019 0507 Gross per 24 hour  Intake 120 ml  Output 3 ml  Net 117 ml   Filed Weights   11/25/19 2358 11/26/19 0005  Weight: 82.1 kg 81.6 kg    Examination:  General exam: Appears calm and comfortable  Respiratory system: Bibasilar crackles.  Respiratory effort normal. No respiratory distress. No conversational dyspnea.  On room air Cardiovascular system: S1 & S2 heard, RRR. No murmurs. No pedal edema. Gastrointestinal system: Abdomen is nondistended, soft and nontender. Normal bowel sounds heard. Central nervous system: Alert and oriented. No focal neurological deficits. Speech clear.  Extremities: Symmetric in appearance  Skin: No rashes, lesions or ulcers on exposed skin  Psychiatry: Judgement and insight appear normal. Mood & affect appropriate.   Data Reviewed: I have personally reviewed following labs and imaging studies  CBC: Recent Labs  Lab 11/26/19 0006 11/27/19 0214  WBC 8.6 12.3*  HGB 10.8* 10.4*  HCT 33.7* 32.3*  MCV 95.5 94.7  PLT 499* [id number]*   Basic Metabolic Panel: Recent Labs  Lab 11/26/19 0006 11/27/19 0214  NA 139 139  K 4.7 4.0  CL 104 106  CO2 23 23  GLUCOSE 236* 147*  BUN 20 15  CREATININE 0.90 0.72  CALCIUM 9.2 9.0   GFR: Estimated Creatinine Clearance: 75.4 mL/min (by C-G formula based on SCr of 0.72 mg/dL). Liver Function Tests: No results for input(s): AST, ALT, ALKPHOS, BILITOT, PROT, ALBUMIN in the last 168 hours. No results for input(s): LIPASE, AMYLASE in the last 168 hours. No results for  input(s): AMMONIA in the last 168 hours. Coagulation Profile: No results for input(s): INR, PROTIME in the last 168 hours. Cardiac Enzymes: No results for input(s): CKTOTAL, CKMB, CKMBINDEX, TROPONINI in the last 168 hours. BNP (last 3 results) No results for input(s): PROBNP in the last 8760 hours. HbA1C: Recent Labs    11/26/19 0607  HGBA1C <4.2*   CBG: Recent Labs  Lab 11/26/19 1137 11/26/19 1603 11/26/19 2117 11/27/19 0631 11/27/19 1138  GLUCAP 301* 165* 155* 118* 197*   Lipid Profile: Recent Labs    11/26/19 0607  CHOL 165  HDL 39*  LDLCALC 114*  TRIG 59  CHOLHDL 4.2   Thyroid Function Tests: No results for input(s): TSH, T4TOTAL, FREET4, T3FREE, THYROIDAB in the last 72 hours. Anemia Panel: Recent Labs    11/26/19 0607  FERRITIN 148  TIBC 301  IRON 46   Sepsis Labs: Recent Labs  Lab 11/26/19 0607  PROCALCITON <0.10    Recent Results (from the past 240 hour(s))  SARS CORONAVIRUS 2 (TAT 6-24 HRS) Nasopharyngeal Nasopharyngeal Swab     Status: None   Collection Time: 11/26/19  1:33 AM   Specimen: Nasopharyngeal Swab  Result Value  Ref Range Status   SARS Coronavirus 2 NEGATIVE NEGATIVE Final    Comment: (NOTE) SARS-CoV-2 target nucleic acids are NOT DETECTED. The SARS-CoV-2 RNA is generally detectable in upper and lower respiratory specimens during the acute phase of infection. Negative results do not preclude SARS-CoV-2 infection, do not rule out co-infections with other pathogens, and should not be used as the sole basis for treatment or other patient management decisions. Negative results must be combined with clinical observations, patient history, and epidemiological information. The expected result is Negative. Fact Sheet for Patients: SugarRoll.be Fact Sheet for Healthcare Providers: https://www.woods-mathews.com/ This test is not yet approved or cleared by the [place] FDA and  has been  authorized for detection and/or diagnosis of SARS-CoV-2 by FDA under an Emergency Use Authorization (EUA). This EUA will remain  in effect (meaning this test can be used) for the duration of the COVID-19 declaration under Section 56 4(b)(1) of the Act, 21 U.S.C. section 360bbb-3(b)(1), unless the authorization is terminated or revoked sooner. Performed at [facility], [place] [address], [place], [place] [address]   Respiratory Panel by RT PCR (Flu A&B, Covid) - Nasopharyngeal Swab     Status: None   Collection Time: 11/26/19  4:21 AM   Specimen: Nasopharyngeal Swab  Result Value Ref Range Status   SARS Coronavirus 2 by RT PCR NEGATIVE NEGATIVE Final    Comment: (NOTE) SARS-CoV-2 target nucleic acids are NOT DETECTED. The SARS-CoV-2 RNA is generally detectable in upper respiratoy specimens during the acute phase of infection. The lowest concentration of SARS-CoV-2 viral copies this assay can detect is 131 copies/mL. A negative result does not preclude SARS-Cov-2 infection and should not be used as the sole basis for treatment or other patient management decisions. A negative result may occur with  improper specimen collection/handling, submission of specimen other than nasopharyngeal swab, presence of viral mutation(s) within the areas targeted by this assay, and inadequate number of viral copies (<131 copies/mL). A negative result must be combined with clinical observations, patient history, and epidemiological information. The expected result is Negative. Fact Sheet for Patients:  PinkCheek.be Fact Sheet for Healthcare Providers:  GravelBags.it This test is not yet ap proved or cleared by the [place] FDA and  has been authorized for detection and/or diagnosis of SARS-CoV-2 by FDA under an Emergency Use Authorization (EUA). This EUA will remain  in effect (meaning this test can be used) for the duration of  the COVID-19 declaration under Section 564(b)(1) of the Act, 21 U.S.C. section 360bbb-3(b)(1), unless the authorization is terminated or revoked sooner.    Influenza A by PCR NEGATIVE NEGATIVE Final   Influenza B by PCR NEGATIVE NEGATIVE Final    Comment: (NOTE) The Xpert Xpress SARS-CoV-2/FLU/RSV assay is intended as an aid in  the diagnosis of influenza from Nasopharyngeal swab specimens and  should not be used as a sole basis for treatment. Nasal washings and  aspirates are unacceptable for Xpert Xpress SARS-CoV-2/FLU/RSV  testing. Fact Sheet for Patients: PinkCheek.be Fact Sheet for Healthcare Providers: GravelBags.it This test is not yet approved or cleared by the [place] FDA and  has been authorized for detection and/or diagnosis of SARS-CoV-2 by  FDA under an Emergency Use Authorization (EUA). This EUA will remain  in effect (meaning this test can be used) for the duration of the  Covid-19 declaration under Section 564(b)(1) of the Act, 21  U.S.C. section 360bbb-3(b)(1), unless the authorization is  terminated or revoked. Performed at [facility],  [address] [place], [place] [address]       Radiology Studies: DG Chest 2 View  Result Date: 11/26/2019 CLINICAL DATA:  Chest pain.  Shortness of breath. EXAM: CHEST - 2 VIEW COMPARISON:  07/17/2016 FINDINGS: Heart size upper limits of normal. Patchy bilateral mid and lower lung pulmonary infiltrates most consistent with bronchopneumonia, either bacterial or viral. No evidence of dense consolidation or lobar collapse. No effusions. No acute bone finding. Chronic scoliosis. IMPRESSION: Widespread patchy pulmonary infiltrates most consistent with pneumonia. This could be bacterial or viral. Electronically Signed   By: Nelson Chimes M.D.   On: 11/26/2019 00:58   CT Angio Chest PE W and/or Wo Contrast  Result Date: 11/26/2019 CLINICAL DATA:  Low to intermediate  probability for pulmonary embolus. Positive D-dimer. Chest pain and SOB. EXAM: CT ANGIOGRAPHY CHEST WITH CONTRAST TECHNIQUE: Multidetector CT imaging of the chest was performed using the standard protocol during bolus administration of intravenous contrast. Multiplanar CT image reconstructions and MIPs were obtained to evaluate the vascular anatomy. CONTRAST:  21mL OMNIPAQUE IOHEXOL 350 MG/ML SOLN COMPARISON:  None. FINDINGS: Cardiovascular: Filling defect within a segmental branch of the posterior right lower lobe pulmonary artery is noted compatible with pulmonary embolus, image 243/7. No additional pulmonary emboli identified. Mild cardiac enlargement. No pericardial effusion. Three vessel coronary artery atherosclerotic calcifications. Aortic atherosclerosis Mediastinum/Nodes: Normal appearance of the thyroid gland. The trachea appears patent and is midline. Small hiatal hernia. No adenopathy identified. Lungs/Pleura: There is a mosaic attenuation pattern identified bilaterally. Bilateral multifocal areas of peripheral predominant patchy and nodular areas of ground-glass attenuate and airspace consolidation is identified involving the upper and lower lobes. Scar like densities are noted within both upper lobes. Upper Abdomen: No acute abnormality. Musculoskeletal: No chest wall abnormality. No acute or significant osseous findings. Review of the MIP images confirms the above findings. IMPRESSION: 1. Examination is positive for acute pulmonary embolus within a segmental branch of the posterior right lower lobe pulmonary artery. 2. Bilateral multifocal areas of ground-glass attenuate and nodular attenuates the upper and lower lobes. Findings are favored to represent multifocal pneumonia. Follow-up imaging is advised in 3-4 weeks following trial of antibiotic therapy to ensure resolution and exclude underlying malignancy. 3. Multi vessel coronary artery atherosclerotic calcifications. 4. Hiatal hernia. 5. Critical  Value/emergent results were called by telephone at the time of interpretation on 11/26/2019 at 7:05 am to provider Delora Fuel, who verbally acknowledged these results. Aortic Atherosclerosis (ICD10-I70.0). Electronically Signed   By: Kerby Moors M.D.   On: 11/26/2019 07:06   ECHOCARDIOGRAM COMPLETE  Result Date: 11/26/2019    ECHOCARDIOGRAM REPORT   Patient Name:   MAYLA TEETS [facility] Date of Exam: 11/26/2019 Medical Rec #:  [id number]       Height:       68.0 in Accession #:    [id number]      Weight:       180.0 lb Date of Birth:  [birth year]        BSA:          1.95 m Patient Age:    10 years        BP:           130/85 mmHg Patient Gender: F               HR:           89 bpm. Exam Location:  Inpatient Procedure: 2D Echo Indications:    chest pain 786.50  History:  Patient has no prior history of Echocardiogram examinations.                 COPD, Signs/Symptoms:Chest Pain; Risk Factors:Hypertension and                 Dyslipidemia.  Sonographer:    Johny Chess Referring Phys: [id number] [place]  1. Left ventricular ejection fraction, by estimation, is 50 to 55%. The left ventricle has low normal function. The left ventricle demonstrates regional wall motion abnormalities (see scoring diagram/findings for description). Left ventricular diastolic  parameters are consistent with Grade I diastolic dysfunction (impaired relaxation).  2. Right ventricular systolic function is normal. The right ventricular size is normal. There is normal pulmonary artery systolic pressure. The estimated right ventricular systolic pressure is 123XX123 mmHg.  3. The mitral valve is grossly normal. Mild mitral valve regurgitation.  4. The aortic valve is tricuspid. Aortic valve regurgitation is not visualized. No aortic stenosis is present.  5. The inferior vena cava is normal in size with greater than 50% respiratory variability, suggesting right atrial pressure of 3 mmHg. Comparison(s): No prior  Echocardiogram. FINDINGS  Left Ventricle: Left ventricular ejection fraction, by estimation, is 50 to 55%. The left ventricle has low normal function. The left ventricle demonstrates regional wall motion abnormalities. The left ventricular internal cavity size was normal in size. There is no left ventricular hypertrophy. Left ventricular diastolic parameters are consistent with Grade I diastolic dysfunction (impaired relaxation). Normal left ventricular filling pressure.  LV Wall Scoring: The posterior wall is hypokinetic. Right Ventricle: The right ventricular size is normal. No increase in right ventricular wall thickness. Right ventricular systolic function is normal. There is normal pulmonary artery systolic pressure. The tricuspid regurgitant velocity is 2.69 m/s, and  with an assumed right atrial pressure of 3 mmHg, the estimated right ventricular systolic pressure is 123XX123 mmHg. Left Atrium: Left atrial size was normal in size. Right Atrium: Right atrial size was normal in size. Pericardium: There is no evidence of pericardial effusion. Mitral Valve: The mitral valve is grossly normal. Mild mitral valve regurgitation. Tricuspid Valve: The tricuspid valve is grossly normal. Tricuspid valve regurgitation is mild. Aortic Valve: The aortic valve is tricuspid. Aortic valve regurgitation is not visualized. No aortic stenosis is present. Pulmonic Valve: The pulmonic valve was grossly normal. Pulmonic valve regurgitation is not visualized. Aorta: The aortic root is normal in size and structure. Venous: The right upper pulmonary vein is normal. The inferior vena cava is normal in size with greater than 50% respiratory variability, suggesting right atrial pressure of 3 mmHg. IAS/Shunts: No atrial level shunt detected by color flow Doppler.  LEFT VENTRICLE PLAX 2D LVIDd:         4.50 cm  Diastology LVIDs:         3.40 cm  LV e' lateral:   9.68 cm/s LV PW:         0.90 cm  LV E/e' lateral: 12.8 LV IVS:        0.80 cm  LV  e' medial:    6.74 cm/s LVOT diam:     1.80 cm  LV E/e' medial:  18.4 LV SV:         68.71 ml LV SV Index:   22.55 LVOT Area:     2.54 cm  RIGHT VENTRICLE RV S prime:     12.90 cm/s TAPSE (M-mode): 2.1 cm LEFT ATRIUM             Index  RIGHT ATRIUM           Index LA diam:        3.80 cm 1.94 cm/m  RA Area:     12.80 cm LA Vol (A2C):   48.1 ml 24.61 ml/m RA Volume:   32.20 ml  16.48 ml/m LA Vol (A4C):   49.6 ml 25.38 ml/m LA Biplane Vol: 48.9 ml 25.02 ml/m  AORTIC VALVE LVOT Vmax:   136.00 cm/s LVOT Vmean:  82.700 cm/s LVOT VTI:    0.270 m  AORTA Ao Root diam: 2.90 cm MITRAL VALVE                TRICUSPID VALVE MV Area (PHT): 4.63 cm     TR Peak grad:   28.9 mmHg MV Decel Time: 164 msec     TR Vmax:        269.00 cm/s MV E velocity: 124.00 cm/s MV A velocity: 155.00 cm/s  SHUNTS MV E/A ratio:  0.80         Systemic VTI:  0.27 m                             Systemic Diam: 1.80 cm Eleonore Chiquito MD Electronically signed by Eleonore Chiquito MD Signature Date/Time: 11/26/2019/12:33:37 PM    Final       Scheduled Meds: . amLODipine  2.5 mg Oral Daily  . ascorbic acid  500 mg Oral Daily  . aspirin EC  81 mg Oral Daily  . atorvastatin  80 mg Oral q1800  . doxycycline  100 mg Oral Q12H  . insulin aspart  0-5 Units Subcutaneous QHS  . insulin aspart  0-6 Units Subcutaneous TID WC  . metoprolol tartrate  12.5 mg Oral BID  . mometasone-formoterol  2 puff Inhalation BID  . sodium chloride flush  3 mL Intravenous Once  . vitamin E  400 Units Oral Daily   Continuous Infusions: . heparin 1,150 Units/hr (11/27/19 0915)     LOS: 1 day      Time spent: 25 minutes   Dessa Phi, DO [facility] 11/27/2019, 12:58 PM   Available via Epic secure chat 7am-7pm After these hours, please refer to coverage provider listed on [email or web address]

## 2019-11-28 ENCOUNTER — Inpatient Hospital Stay (HOSPITAL_COMMUNITY): Payer: Medicare PPO

## 2019-11-28 DIAGNOSIS — Z01810 Encounter for preprocedural cardiovascular examination: Secondary | ICD-10-CM

## 2019-11-28 DIAGNOSIS — I25110 Atherosclerotic heart disease of native coronary artery with unstable angina pectoris: Secondary | ICD-10-CM

## 2019-11-28 LAB — BLOOD GAS, ARTERIAL
Acid-base deficit: 0.3 mmol/L (ref 0.0–2.0)
Bicarbonate: 23.5 mmol/L (ref 20.0–28.0)
FIO2: 21
O2 Saturation: 95.8 %
Patient temperature: 37
pCO2 arterial: 36.6 mmHg (ref 32.0–48.0)
pH, Arterial: 7.425 (ref 7.350–7.450)
pO2, Arterial: 81.7 mmHg — ABNORMAL LOW (ref 83.0–108.0)

## 2019-11-28 LAB — CBC
HCT: 31.3 % — ABNORMAL LOW (ref 36.0–46.0)
Hemoglobin: 9.9 g/dL — ABNORMAL LOW (ref 12.0–15.0)
MCH: 30.6 pg (ref 26.0–34.0)
MCHC: 31.6 g/dL (ref 30.0–36.0)
MCV: 96.6 fL (ref 80.0–100.0)
Platelets: 514 10*3/uL — ABNORMAL HIGH (ref 150–400)
RBC: 3.24 MIL/uL — ABNORMAL LOW (ref 3.87–5.11)
RDW: 13.2 % (ref 11.5–15.5)
WBC: 8.8 10*3/uL (ref 4.0–10.5)
nRBC: 0 % (ref 0.0–0.2)

## 2019-11-28 LAB — COMPREHENSIVE METABOLIC PANEL
ALT: 34 U/L (ref 0–44)
AST: 34 U/L (ref 15–41)
Albumin: 2.6 g/dL — ABNORMAL LOW (ref 3.5–5.0)
Alkaline Phosphatase: 57 U/L (ref 38–126)
Anion gap: 11 (ref 5–15)
BUN: 19 mg/dL (ref 8–23)
CO2: 23 mmol/L (ref 22–32)
Calcium: 9 mg/dL (ref 8.9–10.3)
Chloride: 106 mmol/L (ref 98–111)
Creatinine, Ser: 0.99 mg/dL (ref 0.44–1.00)
GFR calc Af Amer: >60 mL/min (ref >60)
GFR calc non Af Amer: 59 mL/min — ABNORMAL LOW (ref >60)
Glucose, Bld: 114 mg/dL — ABNORMAL HIGH (ref 70–99)
Potassium: 4.1 mmol/L (ref 3.5–5.1)
Sodium: 140 mmol/L (ref 135–145)
Total Bilirubin: 0.5 mg/dL (ref 0.3–1.2)
Total Protein: 6.8 g/dL (ref 6.5–8.1)

## 2019-11-28 LAB — URINALYSIS, COMPLETE (UACMP) WITH MICROSCOPIC
Bacteria, UA: NONE SEEN
Bilirubin Urine: NEGATIVE
Glucose, UA: NEGATIVE mg/dL
Hgb urine dipstick: NEGATIVE
Ketones, ur: NEGATIVE mg/dL
Leukocytes,Ua: NEGATIVE
Nitrite: NEGATIVE
Protein, ur: NEGATIVE mg/dL
Specific Gravity, Urine: 1.033 — ABNORMAL HIGH (ref 1.005–1.030)
pH: 5 (ref 5.0–8.0)

## 2019-11-28 LAB — APTT: aPTT: 58 seconds — ABNORMAL HIGH (ref 24–36)

## 2019-11-28 LAB — SURGICAL PCR SCREEN
MRSA, PCR: NEGATIVE
Staphylococcus aureus: NEGATIVE

## 2019-11-28 LAB — HEMOGLOBIN A1C
Hgb A1c MFr Bld: 6.9 % — ABNORMAL HIGH (ref 4.8–5.6)
Mean Plasma Glucose: 151.33 mg/dL

## 2019-11-28 LAB — GLUCOSE, CAPILLARY
Glucose-Capillary: 95 mg/dL (ref 70–99)
Glucose-Capillary: 90 mg/dL (ref 70–99)
Glucose-Capillary: 86 mg/dL (ref 70–99)
Glucose-Capillary: 102 mg/dL — ABNORMAL HIGH (ref 70–99)

## 2019-11-28 LAB — HEPARIN LEVEL (UNFRACTIONATED): Heparin Unfractionated: 0.23 IU/mL — ABNORMAL LOW (ref 0.30–0.70)

## 2019-11-28 LAB — PROTIME-INR
INR: 1.2 (ref 0.8–1.2)
Prothrombin Time: 14.9 seconds (ref 11.4–15.2)

## 2019-11-28 LAB — PREALBUMIN: Prealbumin: 21.2 mg/dL (ref 18–38)

## 2019-11-28 MED ORDER — TRANEXAMIC ACID (OHS) PUMP PRIME SOLUTION
2.0000 mg/kg | INTRAVENOUS | Status: DC
  Filled 2019-11-28 (×2): qty 1.63

## 2019-11-28 MED ORDER — CHLORHEXIDINE GLUCONATE CLOTH 2 % EX PADS: 6.0000 | MEDICATED_PAD | Freq: Once | CUTANEOUS | Status: DC

## 2019-11-28 MED ORDER — EPINEPHRINE HCL 5 MG/250ML IV SOLN IN NS
0.0000 ug/min | INTRAVENOUS | Status: DC
  Filled 2019-11-28: qty 250

## 2019-11-28 MED ORDER — SODIUM CHLORIDE 0.9 % IV SOLN
INTRAVENOUS | Status: DC
  Filled 2019-11-28: qty 30

## 2019-11-28 MED ORDER — INSULIN REGULAR(HUMAN) IN NACL 100-0.9 UT/100ML-% IV SOLN
INTRAVENOUS | Status: AC
  Administered 2019-11-29: 10:00:00 .7 [IU]/h via INTRAVENOUS
  Filled 2019-11-28: qty 100

## 2019-11-28 MED ORDER — BISACODYL 5 MG PO TBEC: 5.0000 mg | DELAYED_RELEASE_TABLET | Freq: Once | ORAL | Status: DC

## 2019-11-28 MED ORDER — TEMAZEPAM 15 MG PO CAPS: 15.0000 mg | ORAL_CAPSULE | Freq: Once | ORAL | Status: DC | PRN

## 2019-11-28 MED ORDER — MAGNESIUM SULFATE 50 % IJ SOLN
40.0000 meq | INTRAMUSCULAR | Status: DC
  Filled 2019-11-28: qty 9.85

## 2019-11-28 MED ORDER — POTASSIUM CHLORIDE 2 MEQ/ML IV SOLN
80.0000 meq | INTRAVENOUS | Status: DC
  Filled 2019-11-28: qty 40

## 2019-11-28 MED ORDER — SODIUM CHLORIDE 0.9 % IV SOLN
750.0000 mg | INTRAVENOUS | Status: DC
  Filled 2019-11-28: qty 750

## 2019-11-28 MED ORDER — CHLORHEXIDINE GLUCONATE 0.12 % MT SOLN
15.0000 mL | Freq: Once | OROMUCOSAL | Status: AC
  Administered 2019-11-29: 15 mL via OROMUCOSAL
  Filled 2019-11-28: qty 15

## 2019-11-28 MED ORDER — NITROGLYCERIN 2 % TD OINT
1.0000 [in_us] | TOPICAL_OINTMENT | Freq: Four times a day (QID) | TRANSDERMAL | Status: DC
  Administered 2019-11-28 – 2019-11-29 (×4): 1 [in_us] via TOPICAL
  Filled 2019-11-28: qty 30

## 2019-11-28 MED ORDER — SODIUM CHLORIDE 0.9 % IV SOLN
1.5000 g | INTRAVENOUS | Status: DC
  Filled 2019-11-28: qty 1.5

## 2019-11-28 MED ORDER — PLASMA-LYTE 148 IV SOLN
INTRAVENOUS | Status: AC
  Administered 2019-11-29: 500 mL
  Filled 2019-11-28: qty 2.5

## 2019-11-28 MED ORDER — METOPROLOL TARTRATE 12.5 MG HALF TABLET
12.5000 mg | ORAL_TABLET | Freq: Once | ORAL | Status: AC
  Administered 2019-11-29: 12.5 mg via ORAL
  Filled 2019-11-28: qty 1

## 2019-11-28 MED ORDER — DEXMEDETOMIDINE HCL IN NACL 400 MCG/100ML IV SOLN
0.1000 ug/kg/h | INTRAVENOUS | Status: AC
  Administered 2019-11-29: 10:00:00 .2 ug/kg/h via INTRAVENOUS
  Filled 2019-11-28: qty 100

## 2019-11-28 MED ORDER — PHENYLEPHRINE HCL-NACL 20-0.9 MG/250ML-% IV SOLN
30.0000 ug/min | INTRAVENOUS | Status: DC
  Filled 2019-11-28: qty 250

## 2019-11-28 MED ORDER — NOREPINEPHRINE 4 MG/250ML-% IV SOLN
0.0000 ug/min | INTRAVENOUS | Status: DC
  Filled 2019-11-28: qty 250

## 2019-11-28 MED ORDER — NITROGLYCERIN IN D5W 200-5 MCG/ML-% IV SOLN
2.0000 ug/min | INTRAVENOUS | Status: AC
  Administered 2019-11-29: 10:00:00 16.6 ug/min via INTRAVENOUS
  Filled 2019-11-28: qty 250

## 2019-11-28 MED ORDER — TRANEXAMIC ACID (OHS) BOLUS VIA INFUSION
15.0000 mg/kg | INTRAVENOUS | Status: AC
  Administered 2019-11-29: 1224 mg via INTRAVENOUS
  Filled 2019-11-28: qty 1224

## 2019-11-28 MED ORDER — MILRINONE LACTATE IN DEXTROSE 20-5 MG/100ML-% IV SOLN
0.3000 ug/kg/min | INTRAVENOUS | Status: DC
  Filled 2019-11-28: qty 100

## 2019-11-28 MED ORDER — CHLORHEXIDINE GLUCONATE 4 % EX LIQD
CUTANEOUS | Status: AC
  Filled 2019-11-28: qty 15

## 2019-11-28 MED ORDER — VANCOMYCIN HCL 1250 MG/250ML IV SOLN
1250.0000 mg | INTRAVENOUS | Status: AC
  Administered 2019-11-29: 09:00:00 1250 mg via INTRAVENOUS
  Filled 2019-11-28: qty 250

## 2019-11-28 MED ORDER — TRANEXAMIC ACID 1000 MG/10ML IV SOLN
1.5000 mg/kg/h | INTRAVENOUS | Status: AC
  Administered 2019-11-29: 1.5 mg/kg/h via INTRAVENOUS
  Filled 2019-11-28: qty 25

## 2019-11-28 NOTE — Progress Notes (Signed)
ANTICOAGULATION CONSULT NOTE - Follow Up Consult  Pharmacy Consult for Heparin Indication: chest pain/ACS and pulmonary embolus, multivessel CAD awaiting CABG  No Known Allergies  Patient Measurements: Height: 5\' 8"  (172.7 cm) Weight: 180 lb (81.6 kg) IBW/kg (Calculated) : 63.9 Heparin Dosing Weight: 80 kg  Vital Signs: Temp: 98 F (36.7 C) (02/16 0447) Temp Source: Oral (02/16 0447) BP: 128/69 (02/16 0447) Pulse Rate: 73 (02/16 0447)  Labs: Recent Labs    11/26/19 0006 11/26/19 0006 11/26/19 0213 11/26/19 0718 11/26/19 1117 11/26/19 1246 11/26/19 1523 11/26/19 1915 11/27/19 0214 11/28/19 0308  HGB 10.8*   < >  --   --   --   --   --   --  10.4* 9.9*  HCT 33.7*  --   --   --   --   --   --   --  32.3* 31.3*  PLT 499*  --   --   --   --   --   --   --  519* 514*  APTT  --   --   --   --   --   --   --   --   --  58*  LABPROT  --   --   --   --   --   --   --   --   --  14.9  INR  --   --   --   --   --   --   --   --   --  1.2  HEPARINUNFRC  --   --   --   --    < >  --   --  0.67 0.76* 0.23*  CREATININE 0.90  --   --   --   --   --   --   --  0.72 0.99  TROPONINIHS 78*  --    < > 1,786*  --  1,092* 932*  --   --   --    < > = values in this interval not displayed.    Estimated Creatinine Clearance: 61 mL/min (by C-G formula based on SCr of 0.99 mg/dL).   Medications:  Scheduled:  . amLODipine  2.5 mg Oral Daily  . ascorbic acid  500 mg Oral Daily  . aspirin EC  81 mg Oral Daily  . atorvastatin  80 mg Oral q1800  . doxycycline  100 mg Oral Q12H  . insulin aspart  0-5 Units Subcutaneous QHS  . insulin aspart  0-6 Units Subcutaneous TID WC  . metoprolol tartrate  12.5 mg Oral BID  . mometasone-formoterol  2 puff Inhalation BID  . sodium chloride flush  3 mL Intravenous Once  . sodium chloride flush  3 mL Intravenous Q12H  . vitamin E  400 Units Oral Daily    Assessment: 13 yof presenting to the ED with chest pain found to have NSTEMI and CTA confirmed PE.  No anticoagulation PTA.  -heparin level was 0.76  prior to cardiac cath on heparin drip rate 1200 units/hr, thus heparin rate decreased to 1150 units/hr to keep at goal 0.3-0.7 units/ml.   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  TCTS consulted for CABG. Pharmacy consulted to resume heparin 1hr after TR band off & no bleeding, no bolus.  At 21:52 RN reports she is withdrawing last 2 cc and removing TR band ~ 22:00. No bleeding per RN's report. This RN will notify  pharmacist if any issues with TR band removal or any bleeding noted.  2/16 AM update:  Heparin level low No issues per RN  Goal of Therapy:  Heparin level 0.3-0.7 units/ml Monitor platelets by anticoagulation protocol: Yes   Plan:  Inc heparin slightly to 1200 units/hr 1400 heparin level  Narda Bonds, PharmD, BCPS Clinical Pharmacist Phone: [phone]

## 2019-11-28 NOTE — Progress Notes (Signed)
Pt complained of having some chest pain around 2030 11/27/2019, no sings of distress, morphine given and was effective.

## 2019-11-28 NOTE — Consult Note (Addendum)
[address]       [place] [address]             [phone]        Miranda [name] [facility] Medical Record [id number] Date of Birth: December 28, 1950  Referring: Martin[place] Primary Care: Boyce Medici, FNP Primary Cardiologist:No primary care provider on file.  Chief Complaint:    Chief Complaint  Patient presents with  . Chest Pain    History of Present Illness:      Miranda [name] is a 70 yo AA female with known history of HTN, Hyperlipidemia, COPD and gout.  She presented to the ED with complaints of chest pain.  The patients symptoms developed while she was walking home from work.  The pain is located on her left side, it feels like pressure and is accompanied by shortness of breath.  She sat down and rested and her symptoms initially improved.  However, when she started walking again her symptoms started again and were more severe with associated nausea and diaphoresis.  The patient admitted these episodes had been occurring for months.  Workup in the ED showed no acute ischemic changes.  Her initial troponin level was elevated at 78. CTA of the chest was positive for acute PE in the RLL pulmonary artery and evidence of multifocal pneumonia.  There was also evidence of multivessel CAD and she does have a positive family history.  She was admitted to the medicine service started no heparin and antibiotics for further workup.  Cardiology consult was obtained and she was ruled in for NSTEMI with further increase in her Troponin level.  She developed chest pain while getting up to use the bathroom.  Echocardiogram was obtained and showed no evidence of RV Strain.  She underwent cardiac catheterization by Dr. Martinique.  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  Currently the patient is having chest pain after she eats or when she gets up and moves around the room.  She remains on a heparin drip.  She has been treated with IV  Morphine which provides relief, but pain keeps recurring.  She was started on NTG paste while I was in the room with the patient.  She is nervous about surgery, but does agreed to proceed.  Current Activity/ Functional Status: Patient is independent with mobility/ambulation, transfers, ADL's, IADL's.   Zubrod Score: At the time of surgery this patient's most appropriate activity status/level should be described as: []     0    Normal activity, no symptoms [x]     1    Restricted in physical strenuous activity but ambulatory, able to do out light work []     2    Ambulatory and capable of self care, unable to do work activities, up and about                 more than 50%  Of the time                            []     3    Only limited self care, in bed greater than 50% of waking hours []     4    Completely disabled, no self care, confined to bed or chair []     5    Moribund  Past Medical History:  Diagnosis Date  . COPD (chronic  obstructive pulmonary disease) ([place])   . HTN (hypertension)   . Hypertension   . Pneumonia 11/2019    Past Surgical History:  Procedure Laterality Date  . ABDOMINAL HYSTERECTOMY    . LEFT HEART CATH AND CORONARY ANGIOGRAPHY N/A 11/27/2019   Procedure: LEFT HEART CATH AND CORONARY ANGIOGRAPHY;  Surgeon: Martinique, Peter M, MD;  Location: [facility];  Service: Cardiovascular;  Laterality: N/A;    Social History   Tobacco Use  Smoking Status Former Smoker  . Years: 15.00  Smokeless Tobacco Never Used    Social History   Substance and Sexual Activity  Alcohol Use Yes  . Alcohol/week: 0.0 standard drinks   Comment: ocaasional     No Known Allergies  Current Facility-Administered Medications  Medication Dose Route Frequency Provider Last Rate Last Admin  . 0.9 %  sodium chloride infusion  250 mL Intravenous PRN Martinique, Peter M, MD      . acetaminophen (TYLENOL) tablet 650 mg  650 mg Oral Q6H PRN Martinique, Peter M, MD      . albuterol (PROVENTIL) (2.5  MG/3ML) 0.083% nebulizer solution 2.5 mg  2.5 mg Inhalation Q4H PRN Martinique, Peter M, MD      . amLODipine (NORVASC) tablet 2.5 mg  2.5 mg Oral Daily Martinique, Peter M, MD   2.5 mg at 11/27/19 [id number]  . ascorbic acid (VITAMIN C) tablet 500 mg  500 mg Oral Daily Martinique, Peter M, MD   500 mg at 11/27/19 [id number]  . aspirin EC tablet 81 mg  81 mg Oral Daily Martinique, Peter M, MD   81 mg at 11/27/19 [id number]  . atorvastatin (LIPITOR) tablet 80 mg  80 mg Oral q1800 Martinique, Peter M, MD   80 mg at 11/27/19 2235  . doxycycline (VIBRA-TABS) tablet 100 mg  100 mg Oral Q12H Martinique, Peter M, MD   100 mg at 11/27/19 2235  . guaiFENesin (ROBITUSSIN) 100 MG/5ML solution 100 mg  5 mL Oral Q4H PRN Martinique, Peter M, MD   100 mg at 11/27/19 0908  . heparin ADULT infusion 100 units/mL (25000 units/223mL sodium chloride 0.45%)  1,200 Units/hr Intravenous Continuous Erenest Blank, RPH 12 mL/hr at 11/28/19 0559 1,200 Units/hr at 11/28/19 0559  . insulin aspart (novoLOG) injection 0-5 Units  0-5 Units Subcutaneous QHS Martinique, Peter M, MD      . insulin aspart (novoLOG) injection 0-6 Units  0-6 Units Subcutaneous TID WC Martinique, Peter M, MD   1 Units at 11/27/19 1246  . lip balm (CARMEX) ointment   Topical PRN Martinique, Peter M, MD      . metoprolol tartrate (LOPRESSOR) tablet 12.5 mg  12.5 mg Oral BID Martinique, Peter M, MD   12.5 mg at 11/27/19 2235  . mometasone-formoterol (DULERA) 200-5 MCG/ACT inhaler 2 puff  2 puff Inhalation BID Martinique, Peter M, MD      . morphine 2 MG/ML injection 1 mg  1 mg Intravenous Q2H PRN Martinique, Peter M, MD   1 mg at 11/27/19 2029  . nitroGLYCERIN (NITROGLYN) 2 % ointment 1 inch  1 inch Topical Q6H Reino Bellis B, NP      . nitroGLYCERIN (NITROSTAT) SL tablet 0.4 mg  0.4 mg Sublingual Q5 min PRN Martinique, Peter M, MD   0.4 mg at 11/27/19 0240  . ondansetron (ZOFRAN) injection 4 mg  4 mg Intravenous Q6H PRN Martinique, Peter M, MD      . sodium chloride flush (NS) 0.9 % injection 3 mL  3 mL  Intravenous Once  Martinique, Peter M, MD      . sodium chloride flush (NS) 0.9 % injection 3 mL  3 mL Intravenous Q12H Martinique, Peter M, MD      . sodium chloride flush (NS) 0.9 % injection 3 mL  3 mL Intravenous PRN Martinique, Peter M, MD      . vitamin E capsule 400 Units  400 Units Oral Daily Martinique, Peter M, MD   400 Units at 11/27/19 0908  . white petrolatum (VASELINE) gel   Topical PRN Martinique, Peter M, MD        Medications Prior to Admission  Medication Sig Dispense Refill Last Dose  . albuterol (VENTOLIN HFA) 108 (90 Base) MCG/ACT inhaler Inhale 2 puffs into the lungs every 4 (four) hours as needed for wheezing or shortness of breath.    11/25/2019 at Unknown time  . amLODipine (NORVASC) 2.5 MG tablet Take 2.5 mg by mouth daily. Pt may take additional dose as needed for HBP   11/25/2019 at Unknown time  . ascorbic acid (VITAMIN C) 500 MG tablet Take 500 mg by mouth daily.   11/25/2019 at Unknown time  . aspirin EC 81 MG tablet Take 81 mg by mouth every other day.   11/25/2019 at Unknown time  . Chlorphen-Pseudoephed-APAP (THERAFLU FLU/COLD PO) Take 1 Package by mouth daily as needed (cold symptoms).   Past Week at Unknown time  . colchicine 0.6 MG tablet Take 2 tablets now, then 1 in an hour. Wait 3 days to take this again. 30 tablet 5 11/25/2019 at Unknown time  . Fluticasone-Salmeterol (ADVAIR) 100-50 MCG/DOSE AEPB Inhale 1 puff into the lungs daily as needed (SOB).    Past Month at Unknown time  . indomethacin (INDOCIN) 50 MG capsule Take 1 capsule (50 mg total) by mouth 3 (three) times daily with meals. (Patient taking differently: Take 50 mg by mouth 3 (three) times daily as needed for mild pain or moderate pain. ) 30 capsule 6 Past Month at Unknown time  . Phenylephrine-Aspirin (ALKA-SELTZER PLUS SINUS) 7.8-325 MG TBEF Take 2 tablets by mouth daily as needed (cold symptoms).   Past Week at Unknown time  . rosuvastatin (CRESTOR) 20 MG tablet Take 20 mg by mouth daily.    11/25/2019 at Unknown time  .  telmisartan-hydrochlorothiazide (MICARDIS HCT) 40-12.5 MG tablet Take 1 tablet by mouth daily. Return in 12/17 for refills. 90 tablet 1 11/25/2019 at Unknown time  . vitamin E (VITAMIN E) 180 MG (400 UNITS) capsule Take 400 Units by mouth daily.   11/25/2019 at Unknown time    Family History  Problem Relation Age of Onset  . Coronary artery disease Mother    Review of Systems:   ROS     Cardiac Review of Systems: Y or  [    ]= no  Chest Pain [ Y   ]  Resting SOB [   ] Exertional SOB  [ Y ]  Orthopnea [  ]   Pedal Edema [   ]    Palpitations [ N ] Syncope  [  ]   Presyncope [   ]  General Review of Systems: [Y] = yes [  ]=no Constitional: recent weight change [ y, about 5 lbs or more but states she hasn't been eating regularly does ]; anorexia [  ]; fatigue [  ]; nausea Aqua.Slicker  ]; night sweats [  ]; fever [  ]; or chills [  ]  Dental: Last Dentist visit:   Eye : blurred vision [  ]; diplopia [   ]; vision changes [  ];  Amaurosis fugax[  ]; Resp: cough [ N ];  wheezing[  ];  hemoptysis[  ]; shortness of breath[  ]; paroxysmal nocturnal dyspnea[  ]; dyspnea on exertion[  ]; or orthopnea[  ];  GI:  gallstones[  ], vomiting[ N ];  dysphagia[  ]; melena[  ];  hematochezia [  ]; heartburn[  ];   Hx of  Colonoscopy[  ]; GU: kidney stones [  ]; hematuria[  ];   dysuria [  ];  nocturia[  ];  history of     obstruction [  ]; urinary frequency [  ]             Skin: rash, swelling[ N ];, hair loss[  ];  peripheral edema[N  ];  or itching[  ]; Musculosketetal: myalgias[  ];  joint swelling[  ];  joint erythema[  ];  joint pain[  ];  back pain[  ];  Heme/Lymph: bruising[  ];  bleeding[  ];  anemia[  ];  Neuro: TIA[  ];  headaches[ N ];  stroke[  ];  vertigo[  ];  seizures[  ];   paresthesias[  ];  difficulty walking[N  ];  Psych:depression[  ]; anxiety[  ];  Endocrine: diabetes[ N ];  thyroid dysfunction[ N ];  Physical Exam: BP (!) 157/79 (BP  Location: Left Arm)   Pulse 73   Temp 98 F (36.7 C) (Oral)   Resp 16   Ht 5\' 8"  (1.727 m)   Wt 81.6 kg   SpO2 98%   BMI 27.37 kg/m    General appearance: alert, cooperative and no distress Head: Normocephalic, without obvious abnormality, atraumatic Resp: clear to auscultation bilaterally Cardio: regular rate and rhythm GI: soft, non-tender; bowel sounds normal; no masses,  no organomegaly Extremities: extremities normal, atraumatic, no cyanosis or edema Neurologic: Grossly normal  Diagnostic Studies & Laboratory data:     Recent Radiology Findings:   CARDIAC CATHETERIZATION  Result Date: 11/27/2019  Mid LM lesion is 60% stenosed.  Ost LAD to Prox LAD lesion is 95% stenosed.  Prox LAD to Mid LAD lesion is 80% stenosed.  Ost Cx to Prox Cx lesion is 95% stenosed.  Mid Cx lesion is 99% stenosed.  Prox RCA to Mid RCA lesion is 80% stenosed.  LV end diastolic pressure is normal.  1. Critical 3 vessel obstructive CAD 2. Normal LVEDP Plan: reviewed with Dr Angelena Form. Plan CT surgery consult. Patient is currently without chest pain and hemodynamically stable. Will resume heparin post cath.   ECHOCARDIOGRAM COMPLETE  Result Date: 11/26/2019    ECHOCARDIOGRAM REPORT   Patient Name:   Miranda [name] [facility] Date of Exam: 11/26/2019 Medical Rec #:  [id number]       Height:       68.0 in Accession #:    [id number]      Weight:       180.0 lb Date of Birth:  11/26/1950        BSA:          1.95 m Patient Age:    39 years        BP:           130/85 mmHg Patient Gender: F               HR:           89  bpm. Exam Location:  Inpatient Procedure: 2D Echo Indications:    chest pain 786.50  History:        Patient has no prior history of Echocardiogram examinations.                 COPD, Signs/Symptoms:Chest Pain; Risk Factors:Hypertension and                 Dyslipidemia.  Sonographer:    Johny Chess Referring Phys: [id number] [place]  1. Left ventricular ejection fraction, by  estimation, is 50 to 55%. The left ventricle has low normal function. The left ventricle demonstrates regional wall motion abnormalities (see scoring diagram/findings for description). Left ventricular diastolic  parameters are consistent with Grade I diastolic dysfunction (impaired relaxation).  2. Right ventricular systolic function is normal. The right ventricular size is normal. There is normal pulmonary artery systolic pressure. The estimated right ventricular systolic pressure is 123XX123 mmHg.  3. The mitral valve is grossly normal. Mild mitral valve regurgitation.  4. The aortic valve is tricuspid. Aortic valve regurgitation is not visualized. No aortic stenosis is present.  5. The inferior vena cava is normal in size with greater than 50% respiratory variability, suggesting right atrial pressure of 3 mmHg. Comparison(s): No prior Echocardiogram. FINDINGS  Left Ventricle: Left ventricular ejection fraction, by estimation, is 50 to 55%. The left ventricle has low normal function. The left ventricle demonstrates regional wall motion abnormalities. The left ventricular internal cavity size was normal in size. There is no left ventricular hypertrophy. Left ventricular diastolic parameters are consistent with Grade I diastolic dysfunction (impaired relaxation). Normal left ventricular filling pressure.  LV Wall Scoring: The posterior wall is hypokinetic. Right Ventricle: The right ventricular size is normal. No increase in right ventricular wall thickness. Right ventricular systolic function is normal. There is normal pulmonary artery systolic pressure. The tricuspid regurgitant velocity is 2.69 m/s, and  with an assumed right atrial pressure of 3 mmHg, the estimated right ventricular systolic pressure is 123XX123 mmHg. Left Atrium: Left atrial size was normal in size. Right Atrium: Right atrial size was normal in size. Pericardium: There is no evidence of pericardial effusion. Mitral Valve: The mitral valve is grossly  normal. Mild mitral valve regurgitation. Tricuspid Valve: The tricuspid valve is grossly normal. Tricuspid valve regurgitation is mild. Aortic Valve: The aortic valve is tricuspid. Aortic valve regurgitation is not visualized. No aortic stenosis is present. Pulmonic Valve: The pulmonic valve was grossly normal. Pulmonic valve regurgitation is not visualized. Aorta: The aortic root is normal in size and structure. Venous: The right upper pulmonary vein is normal. The inferior vena cava is normal in size with greater than 50% respiratory variability, suggesting right atrial pressure of 3 mmHg. IAS/Shunts: No atrial level shunt detected by color flow Doppler.  LEFT VENTRICLE PLAX 2D LVIDd:         4.50 cm  Diastology LVIDs:         3.40 cm  LV e' lateral:   9.68 cm/s LV PW:         0.90 cm  LV E/e' lateral: 12.8 LV IVS:        0.80 cm  LV e' medial:    6.74 cm/s LVOT diam:     1.80 cm  LV E/e' medial:  18.4 LV SV:         68.71 ml LV SV Index:   22.55 LVOT Area:     2.54 cm  RIGHT VENTRICLE RV S prime:  12.90 cm/s TAPSE (M-mode): 2.1 cm LEFT ATRIUM             Index       RIGHT ATRIUM           Index LA diam:        3.80 cm 1.94 cm/m  RA Area:     12.80 cm LA Vol (A2C):   48.1 ml 24.61 ml/m RA Volume:   32.20 ml  16.48 ml/m LA Vol (A4C):   49.6 ml 25.38 ml/m LA Biplane Vol: 48.9 ml 25.02 ml/m  AORTIC VALVE LVOT Vmax:   136.00 cm/s LVOT Vmean:  82.700 cm/s LVOT VTI:    0.270 m  AORTA Ao Root diam: 2.90 cm MITRAL VALVE                TRICUSPID VALVE MV Area (PHT): 4.63 cm     TR Peak grad:   28.9 mmHg MV Decel Time: 164 msec     TR Vmax:        269.00 cm/s MV E velocity: 124.00 cm/s MV A velocity: 155.00 cm/s  SHUNTS MV E/A ratio:  0.80         Systemic VTI:  0.27 m                             Systemic Diam: 1.80 cm Eleonore Chiquito MD Electronically signed by Eleonore Chiquito MD Signature Date/Time: 11/26/2019/12:33:37 PM    Final      I have independently reviewed the above radiologic studies and discussed with  the patient   Recent Lab Findings: Lab Results  Component Value Date   WBC 8.8 11/28/2019   HGB 9.9 (L) 11/28/2019   HCT 31.3 (L) 11/28/2019   PLT 514 (H) 11/28/2019   GLUCOSE 114 (H) 11/28/2019   CHOL 165 11/26/2019   TRIG 59 11/26/2019   HDL 39 (L) 11/26/2019   LDLCALC 114 (H) 11/26/2019   ALT 34 11/28/2019   AST 34 11/28/2019   NA 140 11/28/2019   K 4.1 11/28/2019   CL 106 11/28/2019   CREATININE 0.99 11/28/2019   BUN 19 11/28/2019   CO2 23 11/28/2019   TSH 1.62 03/12/2016   INR 1.2 11/28/2019   HGBA1C 6.9 (H) 11/28/2019    Assessment / Plan:      1. CAD- critical CAD per cath report, recommending CABG, Dr. Orvan Seen aware of patient will evaluate once out of the OR 2. Chest pain- persists when patient eats or gets up to use bathroom, going to attempt NTG paste  3. Acute PE- on Heparin drip 4. HTN 5. Hyperlipidemia 6. Dispo- patient with chest pain during evaluation, she obtained relieve with NTG paste, planning for surgery in the AM  I  spent 55 minutes counseling the patient face to face.   Ellwood Handler, PA-C 11/28/2019 9:32 AM

## 2019-11-28 NOTE — Progress Notes (Signed)
RT attempted ABG x 1 at right radial site unsuccessfully. Patient refused second attempt at this time.

## 2019-11-28 NOTE — Progress Notes (Addendum)
Progress Note  Patient Name: Miranda [name] Date of Encounter: 11/28/2019  Primary Cardiologist: No primary care provider on file.   Subjective   Had an episode of chest pain while getting up to the bathroom, again relieved with morphine.   Inpatient Medications    Scheduled Meds: . amLODipine  2.5 mg Oral Daily  . ascorbic acid  500 mg Oral Daily  . aspirin EC  81 mg Oral Daily  . atorvastatin  80 mg Oral q1800  . doxycycline  100 mg Oral Q12H  . insulin aspart  0-5 Units Subcutaneous QHS  . insulin aspart  0-6 Units Subcutaneous TID WC  . metoprolol tartrate  12.5 mg Oral BID  . mometasone-formoterol  2 puff Inhalation BID  . sodium chloride flush  3 mL Intravenous Once  . sodium chloride flush  3 mL Intravenous Q12H  . vitamin E  400 Units Oral Daily   Continuous Infusions: . sodium chloride    . heparin 1,200 Units/hr (11/28/19 0559)   PRN Meds: sodium chloride, acetaminophen, albuterol, guaiFENesin, lip balm, morphine injection, nitroGLYCERIN, ondansetron (ZOFRAN) IV, sodium chloride flush, white petrolatum   Vital Signs    Vitals:   11/27/19 2235 11/27/19 2342 11/28/19 0447 11/28/19 0806  BP: (!) 100/54 (!) 102/57 128/69 (!) 157/79  Pulse:  (!) 58 73   Resp:  18 20 16   Temp:  98.3 F (36.8 C) 98 F (36.7 C) 98 F (36.7 C)  TempSrc:  Oral Oral Oral  SpO2:  98% 98% 98%  Weight:      Height:        Intake/Output Summary (Last 24 hours) at 11/28/2019 0844 Last data filed at 11/28/2019 0300 Gross per 24 hour  Intake 101.83 ml  Output --  Net 101.83 ml   Last 3 Weights 11/26/2019 11/25/2019 09/17/2016  Weight (lbs) 180 lb 181 lb 187 lb  Weight (kg) 81.647 kg 82.101 kg 84.823 kg      Telemetry    SB - Personally Reviewed  ECG    No new tracing this morning.  Physical Exam  Pleasant older AAF, sitting up in bed.  GEN: No acute distress.   Neck: No JVD Cardiac: RRR, no murmurs, rubs, or gallops.  Respiratory: Clear to auscultation  bilaterally. GI: Soft, nontender, non-distended  MS: No edema; No deformity. Right radial cath site stable.  Neuro:  Nonfocal  Psych: Normal affect   Labs    High Sensitivity Troponin:   Recent Labs  Lab 11/26/19 0213 11/26/19 0607 11/26/19 0718 11/26/19 1246 11/26/19 1523  TROPONINIHS 539* 1,555* 1,786* 1,092* 932*      Chemistry Recent Labs  Lab 11/26/19 0006 11/27/19 0214 11/28/19 0308  NA 139 139 140  K 4.7 4.0 4.1  CL 104 106 106  CO2 23 23 23   GLUCOSE 236* 147* 114*  BUN 20 15 19   CREATININE 0.90 0.72 0.99  CALCIUM 9.2 9.0 9.0  PROT  --   --  6.8  ALBUMIN  --   --  2.6*  AST  --   --  34  ALT  --   --  34  ALKPHOS  --   --  57  BILITOT  --   --  0.5  GFRNONAA >60 >60 59*  GFRAA >60 >60 >60  ANIONGAP 12 10 11      Hematology Recent Labs  Lab 11/26/19 0006 11/27/19 0214 11/28/19 0308  WBC 8.6 12.3* 8.8  RBC 3.53* 3.41* 3.24*  HGB 10.8* 10.4*  9.9*  HCT 33.7* 32.3* 31.3*  MCV 95.5 94.7 96.6  MCH 30.6 30.5 30.6  MCHC 32.0 32.2 31.6  RDW 12.9 12.9 13.2  PLT 499* 519* 514*    BNP Recent Labs  Lab 11/26/19 0607  BNP 135.8*     DDimer  Recent Labs  Lab 11/26/19 0133 11/26/19 0607  DDIMER 2.13* 1.97*     Radiology    CARDIAC CATHETERIZATION  Result Date: 11/27/2019  Mid LM lesion is 60% stenosed.  Ost LAD to Prox LAD lesion is 95% stenosed.  Prox LAD to Mid LAD lesion is 80% stenosed.  Ost Cx to Prox Cx lesion is 95% stenosed.  Mid Cx lesion is 99% stenosed.  Prox RCA to Mid RCA lesion is 80% stenosed.  LV end diastolic pressure is normal.  1. Critical 3 vessel obstructive CAD 2. Normal LVEDP Plan: reviewed with Dr Angelena Form. Plan CT surgery consult. Patient is currently without chest pain and hemodynamically stable. Will resume heparin post cath.   ECHOCARDIOGRAM COMPLETE  Result Date: 11/26/2019    ECHOCARDIOGRAM REPORT   Patient Name:   Miranda [name] [facility] Date of Exam: 11/26/2019 Medical Rec #:  [id number]       Height:       68.0 in  Accession #:    [id number]      Weight:       180.0 lb Date of Birth:  December 09, 1950        BSA:          1.95 m Patient Age:    69 years        BP:           130/85 mmHg Patient Gender: F               HR:           89 bpm. Exam Location:  Inpatient Procedure: 2D Echo Indications:    chest pain 786.50  History:        Patient has no prior history of Echocardiogram examinations.                 COPD, Signs/Symptoms:Chest Pain; Risk Factors:Hypertension and                 Dyslipidemia.  Sonographer:    Johny Chess Referring Phys: [id number] [place]  1. Left ventricular ejection fraction, by estimation, is 50 to 55%. The left ventricle has low normal function. The left ventricle demonstrates regional wall motion abnormalities (see scoring diagram/findings for description). Left ventricular diastolic  parameters are consistent with Grade I diastolic dysfunction (impaired relaxation).  2. Right ventricular systolic function is normal. The right ventricular size is normal. There is normal pulmonary artery systolic pressure. The estimated right ventricular systolic pressure is 123XX123 mmHg.  3. The mitral valve is grossly normal. Mild mitral valve regurgitation.  4. The aortic valve is tricuspid. Aortic valve regurgitation is not visualized. No aortic stenosis is present.  5. The inferior vena cava is normal in size with greater than 50% respiratory variability, suggesting right atrial pressure of 3 mmHg. Comparison(s): No prior Echocardiogram. FINDINGS  Left Ventricle: Left ventricular ejection fraction, by estimation, is 50 to 55%. The left ventricle has low normal function. The left ventricle demonstrates regional wall motion abnormalities. The left ventricular internal cavity size was normal in size. There is no left ventricular hypertrophy. Left ventricular diastolic parameters are consistent with Grade I diastolic dysfunction (impaired relaxation). Normal left ventricular filling pressure.  LV  Wall Scoring: The posterior wall is hypokinetic. Right Ventricle: The right ventricular size is normal. No increase in right ventricular wall thickness. Right ventricular systolic function is normal. There is normal pulmonary artery systolic pressure. The tricuspid regurgitant velocity is 2.69 m/s, and  with an assumed right atrial pressure of 3 mmHg, the estimated right ventricular systolic pressure is 123XX123 mmHg. Left Atrium: Left atrial size was normal in size. Right Atrium: Right atrial size was normal in size. Pericardium: There is no evidence of pericardial effusion. Mitral Valve: The mitral valve is grossly normal. Mild mitral valve regurgitation. Tricuspid Valve: The tricuspid valve is grossly normal. Tricuspid valve regurgitation is mild. Aortic Valve: The aortic valve is tricuspid. Aortic valve regurgitation is not visualized. No aortic stenosis is present. Pulmonic Valve: The pulmonic valve was grossly normal. Pulmonic valve regurgitation is not visualized. Aorta: The aortic root is normal in size and structure. Venous: The right upper pulmonary vein is normal. The inferior vena cava is normal in size with greater than 50% respiratory variability, suggesting right atrial pressure of 3 mmHg. IAS/Shunts: No atrial level shunt detected by color flow Doppler.  LEFT VENTRICLE PLAX 2D LVIDd:         4.50 cm  Diastology LVIDs:         3.40 cm  LV e' lateral:   9.68 cm/s LV PW:         0.90 cm  LV E/e' lateral: 12.8 LV IVS:        0.80 cm  LV e' medial:    6.74 cm/s LVOT diam:     1.80 cm  LV E/e' medial:  18.4 LV SV:         68.71 ml LV SV Index:   22.55 LVOT Area:     2.54 cm  RIGHT VENTRICLE RV S prime:     12.90 cm/s TAPSE (M-mode): 2.1 cm LEFT ATRIUM             Index       RIGHT ATRIUM           Index LA diam:        3.80 cm 1.94 cm/m  RA Area:     12.80 cm LA Vol (A2C):   48.1 ml 24.61 ml/m RA Volume:   32.20 ml  16.48 ml/m LA Vol (A4C):   49.6 ml 25.38 ml/m LA Biplane Vol: 48.9 ml 25.02 ml/m  AORTIC  VALVE LVOT Vmax:   136.00 cm/s LVOT Vmean:  82.700 cm/s LVOT VTI:    0.270 m  AORTA Ao Root diam: 2.90 cm MITRAL VALVE                TRICUSPID VALVE MV Area (PHT): 4.63 cm     TR Peak grad:   28.9 mmHg MV Decel Time: 164 msec     TR Vmax:        269.00 cm/s MV E velocity: 124.00 cm/s MV A velocity: 155.00 cm/s  SHUNTS MV E/A ratio:  0.80         Systemic VTI:  0.27 m                             Systemic Diam: 1.80 cm Eleonore Chiquito MD Electronically signed by Eleonore Chiquito MD Signature Date/Time: 11/26/2019/12:33:37 PM    Final     Cardiac Studies   Cath: 11/27/19   Mid LM lesion is 60% stenosed.  Ost LAD to Prox  LAD lesion is 95% stenosed.  Prox LAD to Mid LAD lesion is 80% stenosed.  Ost Cx to Prox Cx lesion is 95% stenosed.  Mid Cx lesion is 99% stenosed.  Prox RCA to Mid RCA lesion is 80% stenosed.  LV end diastolic pressure is normal.   1. Critical 3 vessel obstructive CAD 2. Normal LVEDP  Plan: reviewed with Dr Angelena Form. Plan CT surgery consult. Patient is currently without chest pain and hemodynamically stable. Will resume heparin post cath.   Diagnostic Dominance: Right   TTE: 11/26/19  IMPRESSIONS    1. Left ventricular ejection fraction, by estimation, is 50 to 55%. The  left ventricle has low normal function. The left ventricle demonstrates  regional wall motion abnormalities (see scoring diagram/findings for  description). Left ventricular diastolic  parameters are consistent with Grade I diastolic dysfunction (impaired  relaxation).  2. Right ventricular systolic function is normal. The right ventricular  size is normal. There is normal pulmonary artery systolic pressure. The  estimated right ventricular systolic pressure is 123XX123 mmHg.  3. The mitral valve is grossly normal. Mild mitral valve regurgitation.  4. The aortic valve is tricuspid. Aortic valve regurgitation is not  visualized. No aortic stenosis is present.  5. The inferior vena cava is  normal in size with greater than 50%  respiratory variability, suggesting right atrial pressure of 3 mmHg.   Patient Profile     69 y.o. female with a hx of HTN, HL and tobacco usewho was seen for the evaluation of chest painat the request of Dr. Roxanne Mins.   Assessment & Plan    1. NSTEMI: hsTn peaked at 1786. Initially consulted regarding her chest pain and NSTEMI, but found to have acute PE. Continued to have episodes of chest pain with EKG changes and rWMA noted. Underwent cardiac cath noted above with 3v disease. TCTS consulted with official consult pending. Continues to have episodes of chest pain with activity. Had nausea when given SL nitro. She is agreeable to try nitropaste.  -- remains on IV heparin, asa, statin and metoprolol. PRN morphine.  2. Acute PE: noted on CT scan. Ddimer was around 2. No strain noted on echo. Does report hx of blood clot in the past while on birth control. 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 Will continue IV heparin for now with plan for CABG.    3. PNA: CT chest with multifocal areas. COVID was negative. Antibiotics per primary.   4. HTN: stable with current therapy  5. HL: on statin, Crestor 20mg  daily. LDL above goal. Was switched to atorvastatin 80mg  on admission.   For questions or updates, please contact [place] Please consult [email or web address] for contact info under   Signed, Reino Bellis, NP  11/28/2019, 8:44 AM    I have personally seen and examined this patient. I agree with the assessment and plan as outlined above.  She was admitted with a NSTEMI and incidentally found to have a small PE. Cardiac cath yesterday with severe multi-vessel CAD. The best method for revascularization appears to be CABG. We have asked CT surgery to see her today to discuss CABG. Echo with normal LVEF, no valve disease. Will continue ASA, statin and metoprolol today. Continue IV heparin. Nitrates added this am with  improvement in chest pain.   Lauree [name] 11/28/2019 10:10 AM

## 2019-11-28 NOTE — Progress Notes (Signed)
PreOp ABG drawn at 2030 per RN request. Resulted in [place].

## 2019-11-28 NOTE — Progress Notes (Signed)
Patient had episode of chest pain. No SOB. Pt refuses to take SL nitroglycerin as she feels sick when in her mouth. IV morphine given with relief. Pt resting with call bell within reach.  Will continue to monitor.

## 2019-11-28 NOTE — Anesthesia Preprocedure Evaluation (Addendum)
Anesthesia Evaluation  Patient identified by MRN, date of birth, ID band Patient awake    Reviewed: Allergy & Precautions, NPO status , Patient's Chart, lab work & pertinent test results  Airway Mallampati: III  TM Distance: >3 FB Neck ROM: Full    Dental  (+) Dental Advisory Given   Pulmonary COPD, former smoker,    breath sounds clear to auscultation       Cardiovascular hypertension, Pt. on medications + CAD and + Past MI   Rhythm:Regular Rate:Normal  Normal EF. Valves okay. Critical 3V disease.   Neuro/Psych negative neurological ROS     GI/Hepatic negative GI ROS, Neg liver ROS,   Endo/Other  negative endocrine ROS  Renal/GU negative Renal ROS     Musculoskeletal   Abdominal   Peds  Hematology  (+) anemia ,   Anesthesia Other Findings   Reproductive/Obstetrics                            Lab Results  Component Value Date   WBC 8.8 11/28/2019   HGB 9.9 (L) 11/28/2019   HCT 31.3 (L) 11/28/2019   MCV 96.6 11/28/2019   PLT 514 (H) 11/28/2019   Lab Results  Component Value Date   CREATININE 0.99 11/28/2019   BUN 19 11/28/2019   NA 140 11/28/2019   K 4.1 11/28/2019   CL 106 11/28/2019   CO2 23 11/28/2019    Anesthesia Physical Anesthesia Plan  ASA: IV  Anesthesia Plan: General   Post-op Pain Management:    Induction: Intravenous  PONV Risk Score and Plan: 3 and Treatment may vary due to age or medical condition  Airway Management Planned: Oral ETT  Additional Equipment: Arterial line, CVP, PA Cath, TEE and Ultrasound Guidance Line Placement  Intra-op Plan:   Post-operative Plan: Post-operative intubation/ventilation  Informed Consent: I have reviewed the patients History and Physical, chart, labs and discussed the procedure including the risks, benefits and alternatives for the proposed anesthesia with the patient or authorized representative who has indicated  his/her understanding and acceptance.     Dental advisory given  Plan Discussed with: CRNA  Anesthesia Plan Comments:        Anesthesia Quick Evaluation

## 2019-11-28 NOTE — Progress Notes (Signed)
Pre-CABG testing has been completed. Preliminary results can be found in CV Proc through chart review.   11/28/19 2:41 PM Miranda [name] RVT

## 2019-11-28 NOTE — Progress Notes (Signed)
PROGRESS NOTE    Miranda [name]  [id number] DOB: Feb 01, 1951 DOA: 11/25/2019 PCP: Boyce Medici, FNP     Brief Narrative:  Miranda [name] a 69 y.o.femalewith medical history significant ofhypertension, hyperlipidemia, COPD, gout who presented to the ED with complaints of chest pain.Patient states after returning home from work, she experienced chest pain while walking at home. The chest pain was left-sided, pressure-like, and associated with shortness of breath. She then sat down and symptoms improved. When she got up again to walk the chest pain started again and was more severe this time. It was now associated with nausea and diaphoresis. She then rested again but continued to have symptoms. States she has had episodes of exertional chest pain for several months. Reports having productive cough and congestion for the past 1 week. No fevers or sick contacts. She was previously smoking cigarettes but quit several years ago. States her mother had a massive heart attack in her 20s. Work up in the ED with initial high-sensitivity troponin 78, repeat significantly elevated at 539. EKG without acute ischemic changes. D-dimer elevated at 2.13. Chest x-ray showing widespread patchy pulmonary infiltrates consistent with pneumonia. SARS-CoV-2 PCR test negative. CTA chest showed multifocal pneumonia as well as RLL PE. Patient admitted for NSTEMI, cardiology consulted.   New events last 24 hours / Subjective: Underwent heart cath yesterday which revealed three-vessel disease.  Cardiothoracic surgery consulted.  Assessment & Plan:   Principal Problem:   NSTEMI (non-ST elevated myocardial infarction) ([place]) Active Problems:   Hypertension   Multifocal pneumonia   Hyperlipidemia   Anemia   NSTEMI -Troponin 78 --> 539 --> 1555 --> 1786 --> 1092 --> 932 -Echocardiogram  with EF 50 to [id number], grade 1 diastolic dysfunction, demonstrated regional wall motion abnormalities  -Status post  heart cath 2/15 which revealed three-vessel disease -Heparin drip -Aspirin, Lipitor, lopressor  -Cardiothoracic surgery consulted for CABG evaluation  Right lower lobe PE -CTA chest: positive for acute pulmonary embolus within a segmental branch of the posterior right lower lobe pulmonary artery. -Patient states that she had a blood clot decades ago while she was on birth control. At that time, she remained on Coumadin for several months. 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 Findings are favored to represent multifocal pneumonia. Follow-up imaging is advised in 3-4 weeks following trial of antibiotic therapy to ensure resolution and exclude underlying malignancy. -Patient has had some productive cough for the past week without any fevers, symptoms have been improving -Continue doxycycline   -Robitussin PRN   Hypertension -Continue amlodipine  Hyperlipidemia -Continue Lipitor   DVT prophylaxis: IV heparin Code Status: Full code Family Communication: None at bedside Disposition Plan:  . Patient is from home prior to admission. . Currently in-hospital treatment needed due to further treatment for PE, pneumonia as well as evaluation for CABG . Suspect patient will discharge home once cleared from cardiothoracic surgery, cardiology standpoint.  Suspect several more days of hospitalization.   Consultants:   Cardiology  Cardiothoracic surgery  Procedures:   Heart cath 2/15  Antimicrobials:  Anti-infectives (From admission, onward)   Start     Dose/Rate Route Frequency Ordered Stop   11/26/19 1000  doxycycline (VIBRA-TABS) tablet 100 mg     100 mg Oral Every 12 hours  11/26/19 0751 12/01/19 0959    11/26/19 0115  doxycycline (VIBRA-TABS) tablet 100 mg     100 mg Oral  Once 11/26/19 0113 11/26/19 0130       Objective: Vitals:   11/28/19 0934 11/28/19 0947 11/28/19 0958 11/28/19 1000  BP: (!) 143/79 134/70 (!) 145/79 (!) 145/78  Pulse:      Resp: (!) 26 (!) 23 17 14   Temp:      TempSrc:      SpO2:      Weight:      Height:        Intake/Output Summary (Last 24 hours) at 11/28/2019 1029 Last data filed at 11/28/2019 0300 Gross per 24 hour  Intake 101.83 ml  Output --  Net 101.83 ml   Filed Weights   11/25/19 2358 11/26/19 0005  Weight: 82.1 kg 81.6 kg    Examination: General exam: Appears calm and comfortable  Respiratory system: Respiratory effort normal.  On room air without distress.  No conversational dyspnea. Cardiovascular system: S1 & S2 heard, RRR. No pedal edema. Gastrointestinal system: Abdomen is nondistended, soft and nontender. Normal bowel sounds heard. Central nervous system: Alert and oriented. Non focal exam. Speech clear  Extremities: Symmetric in appearance bilaterally  Skin: No rashes, lesions or ulcers on exposed skin  Psychiatry: Judgement and insight appear stable. Mood & affect appropriate.    Data Reviewed: I have personally reviewed following labs and imaging studies  CBC: Recent Labs  Lab 11/26/19 0006 11/27/19 0214 11/28/19 0308  WBC 8.6 12.3* 8.8  HGB 10.8* 10.4* 9.9*  HCT 33.7* 32.3* 31.3*  MCV 95.5 94.7 96.6  PLT 499* 519* 0000000*   Basic Metabolic Panel: Recent Labs  Lab 11/26/19 0006 11/27/19 0214 11/28/19 0308  NA 139 139 140  K 4.7 4.0 4.1  CL 104 106 106  CO2 23 23 23   GLUCOSE 236* 147* 114*  BUN 20 15 19   CREATININE 0.90 0.72 0.99  CALCIUM 9.2 9.0 9.0   GFR: Estimated Creatinine Clearance: 61 mL/min (by C-G formula based on SCr of 0.99 mg/dL). Liver Function Tests: Recent Labs  Lab 11/28/19 0308  AST 34  ALT 34  ALKPHOS 57  BILITOT 0.5  PROT 6.8  ALBUMIN 2.6*   No results for input(s): LIPASE,  AMYLASE in the last 168 hours. No results for input(s): AMMONIA in the last 168 hours. Coagulation Profile: Recent Labs  Lab 11/28/19 0308  INR 1.2   Cardiac Enzymes: No results for input(s): CKTOTAL, CKMB, CKMBINDEX, TROPONINI in the last 168 hours. BNP (last 3 results) No results for input(s): PROBNP in the last 8760 hours. HbA1C: Recent Labs    11/26/19 0607 11/28/19 0308  HGBA1C <4.2* 6.9*   CBG: Recent Labs  Lab 11/27/19 0631 11/27/19 1138 11/27/19 1617 11/27/19 2157 11/28/19 0618  GLUCAP 118* 197* 82 127* 95   Lipid Profile: Recent Labs    11/26/19 0607  CHOL 165  HDL 39*  LDLCALC 114*  TRIG 59  CHOLHDL 4.2   Thyroid Function Tests: No results for input(s): TSH, T4TOTAL, FREET4, T3FREE, THYROIDAB in the last 72 hours. Anemia Panel: Recent Labs    11/26/19 0607  FERRITIN 148  TIBC 301  IRON 46   Sepsis Labs: Recent Labs  Lab 11/26/19 0607  PROCALCITON <0.10    Recent Results (from the past 240 hour(s))  SARS CORONAVIRUS 2 (TAT 6-24 HRS) Nasopharyngeal Nasopharyngeal Swab     Status: None   Collection Time: 11/26/19  1:33 AM   Specimen:  Nasopharyngeal Swab  Result Value Ref Range Status   SARS Coronavirus 2 NEGATIVE NEGATIVE Final    Comment: (NOTE) SARS-CoV-2 target nucleic acids are NOT DETECTED. The SARS-CoV-2 RNA is generally detectable in upper and lower respiratory specimens during the acute phase of infection. Negative results do not preclude SARS-CoV-2 infection, do not rule out co-infections with other pathogens, and should not be used as the sole basis for treatment or other patient management decisions. Negative results must be combined with clinical observations, patient history, and epidemiological information. The expected result is Negative. Fact Sheet for Patients: SugarRoll.be Fact Sheet for Healthcare Providers: https://www.woods-mathews.com/ This test is not yet approved or  cleared by the [place] FDA and  has been authorized for detection and/or diagnosis of SARS-CoV-2 by FDA under an Emergency Use Authorization (EUA). This EUA will remain  in effect (meaning this test can be used) for the duration of the COVID-19 declaration under Section 56 4(b)(1) of the Act, 21 U.S.C. section 360bbb-3(b)(1), unless the authorization is terminated or revoked sooner. Performed at [facility], [place] [address] [place], [place] [address]   Respiratory Panel by RT PCR (Flu A&B, Covid) - Nasopharyngeal Swab     Status: None   Collection Time: 11/26/19  4:21 AM   Specimen: Nasopharyngeal Swab  Result Value Ref Range Status   SARS Coronavirus 2 by RT PCR NEGATIVE NEGATIVE Final    Comment: (NOTE) SARS-CoV-2 target nucleic acids are NOT DETECTED. The SARS-CoV-2 RNA is generally detectable in upper respiratoy specimens during the acute phase of infection. The lowest concentration of SARS-CoV-2 viral copies this assay can detect is 131 copies/mL. A negative result does not preclude SARS-Cov-2 infection and should not be used as the sole basis for treatment or other patient management decisions. A negative result may occur with  improper specimen collection/handling, submission of specimen other than nasopharyngeal swab, presence of viral mutation(s) within the areas targeted by this assay, and inadequate number of viral copies (<131 copies/mL). A negative result must be combined with clinical observations, patient history, and epidemiological information. The expected result is Negative. Fact Sheet for Patients:  PinkCheek.be Fact Sheet for Healthcare Providers:  GravelBags.it This test is not yet ap proved or cleared by the [place] FDA and  has been authorized for detection and/or diagnosis of SARS-CoV-2 by FDA under an Emergency Use Authorization (EUA). This EUA will remain  in effect (meaning this  test can be used) for the duration of the COVID-19 declaration under Section 564(b)(1) of the Act, 21 U.S.C. section 360bbb-3(b)(1), unless the authorization is terminated or revoked sooner.    Influenza A by PCR NEGATIVE NEGATIVE Final   Influenza B by PCR NEGATIVE NEGATIVE Final    Comment: (NOTE) The Xpert Xpress SARS-CoV-2/FLU/RSV assay is intended as an aid in  the diagnosis of influenza from Nasopharyngeal swab specimens and  should not be used as a sole basis for treatment. Nasal washings and  aspirates are unacceptable for Xpert Xpress SARS-CoV-2/FLU/RSV  testing. Fact Sheet for Patients: PinkCheek.be Fact Sheet for Healthcare Providers: GravelBags.it This test is not yet approved or cleared by the [place] FDA and  has been authorized for detection and/or diagnosis of SARS-CoV-2 by  FDA under an Emergency Use Authorization (EUA). This EUA will remain  in effect (meaning this test can be used) for the duration of the  Covid-19 declaration under Section 564(b)(1) of the Act, 21  U.S.C. section 360bbb-3(b)(1), unless the authorization is  terminated or revoked. Performed  at [facility], [place] [address] [place], [place] [address]       Radiology Studies: CARDIAC CATHETERIZATION  Result Date: 11/27/2019  Mid LM lesion is 60% stenosed.  Ost LAD to Prox LAD lesion is 95% stenosed.  Prox LAD to Mid LAD lesion is 80% stenosed.  Ost Cx to Prox Cx lesion is 95% stenosed.  Mid Cx lesion is 99% stenosed.  Prox RCA to Mid RCA lesion is 80% stenosed.  LV end diastolic pressure is normal.  1. Critical 3 vessel obstructive CAD 2. Normal LVEDP Plan: reviewed with Dr Angelena Form. Plan CT surgery consult. Patient is currently without chest pain and hemodynamically stable. Will resume heparin post cath.   ECHOCARDIOGRAM COMPLETE  Result Date: 11/26/2019    ECHOCARDIOGRAM REPORT   Patient Name:   Miranda [name] [facility] Date of  Exam: 11/26/2019 Medical Rec #:  [id number]       Height:       68.0 in Accession #:    [id number]      Weight:       180.0 lb Date of Birth:  Feb 18, 1951        BSA:          1.95 m Patient Age:    59 years        BP:           130/85 mmHg Patient Gender: F               HR:           89 bpm. Exam Location:  Inpatient Procedure: 2D Echo Indications:    chest pain 786.50  History:        Patient has no prior history of Echocardiogram examinations.                 COPD, Signs/Symptoms:Chest Pain; Risk Factors:Hypertension and                 Dyslipidemia.  Sonographer:    Johny Chess Referring Phys: [id number] Opp  1. Left ventricular ejection fraction, by estimation, is 50 to 55%. The left ventricle has low normal function. The left ventricle demonstrates regional wall motion abnormalities (see scoring diagram/findings for description). Left ventricular diastolic  parameters are consistent with Grade I diastolic dysfunction (impaired relaxation).  2. Right ventricular systolic function is normal. The right ventricular size is normal. There is normal pulmonary artery systolic pressure. The estimated right ventricular systolic pressure is 123XX123 mmHg.  3. The mitral valve is grossly normal. Mild mitral valve regurgitation.  4. The aortic valve is tricuspid. Aortic valve regurgitation is not visualized. No aortic stenosis is present.  5. The inferior vena cava is normal in size with greater than 50% respiratory variability, suggesting right atrial pressure of 3 mmHg. Comparison(s): No prior Echocardiogram. FINDINGS  Left Ventricle: Left ventricular ejection fraction, by estimation, is 50 to 55%. The left ventricle has low normal function. The left ventricle demonstrates regional wall motion abnormalities. The left ventricular internal cavity size was normal in size. There is no left ventricular hypertrophy. Left ventricular diastolic parameters are consistent with Grade I diastolic dysfunction  (impaired relaxation). Normal left ventricular filling pressure.  LV Wall Scoring: The posterior wall is hypokinetic. Right Ventricle: The right ventricular size is normal. No increase in right ventricular wall thickness. Right ventricular systolic function is normal. There is normal pulmonary artery systolic pressure. The tricuspid regurgitant velocity is 2.69 m/s, and  with an assumed right atrial pressure  of 3 mmHg, the estimated right ventricular systolic pressure is 123XX123 mmHg. Left Atrium: Left atrial size was normal in size. Right Atrium: Right atrial size was normal in size. Pericardium: There is no evidence of pericardial effusion. Mitral Valve: The mitral valve is grossly normal. Mild mitral valve regurgitation. Tricuspid Valve: The tricuspid valve is grossly normal. Tricuspid valve regurgitation is mild. Aortic Valve: The aortic valve is tricuspid. Aortic valve regurgitation is not visualized. No aortic stenosis is present. Pulmonic Valve: The pulmonic valve was grossly normal. Pulmonic valve regurgitation is not visualized. Aorta: The aortic root is normal in size and structure. Venous: The right upper pulmonary vein is normal. The inferior vena cava is normal in size with greater than 50% respiratory variability, suggesting right atrial pressure of 3 mmHg. IAS/Shunts: No atrial level shunt detected by color flow Doppler.  LEFT VENTRICLE PLAX 2D LVIDd:         4.50 cm  Diastology LVIDs:         3.40 cm  LV e' lateral:   9.68 cm/s LV PW:         0.90 cm  LV E/e' lateral: 12.8 LV IVS:        0.80 cm  LV e' medial:    6.74 cm/s LVOT diam:     1.80 cm  LV E/e' medial:  18.4 LV SV:         68.71 ml LV SV Index:   22.55 LVOT Area:     2.54 cm  RIGHT VENTRICLE RV S prime:     12.90 cm/s TAPSE (M-mode): 2.1 cm LEFT ATRIUM             Index       RIGHT ATRIUM           Index LA diam:        3.80 cm 1.94 cm/m  RA Area:     12.80 cm LA Vol (A2C):   48.1 ml 24.61 ml/m RA Volume:   32.20 ml  16.48 ml/m LA Vol  (A4C):   49.6 ml 25.38 ml/m LA Biplane Vol: 48.9 ml 25.02 ml/m  AORTIC VALVE LVOT Vmax:   136.00 cm/s LVOT Vmean:  82.700 cm/s LVOT VTI:    0.270 m  AORTA Ao Root diam: 2.90 cm MITRAL VALVE                TRICUSPID VALVE MV Area (PHT): 4.63 cm     TR Peak grad:   28.9 mmHg MV Decel Time: 164 msec     TR Vmax:        269.00 cm/s MV E velocity: 124.00 cm/s MV A velocity: 155.00 cm/s  SHUNTS MV E/A ratio:  0.80         Systemic VTI:  0.27 m                             Systemic Diam: 1.80 cm Eleonore Chiquito MD Electronically signed by Eleonore Chiquito MD Signature Date/Time: 11/26/2019/12:33:37 PM    Final       Scheduled Meds: . amLODipine  2.5 mg Oral Daily  . ascorbic acid  500 mg Oral Daily  . aspirin EC  81 mg Oral Daily  . atorvastatin  80 mg Oral q1800  . doxycycline  100 mg Oral Q12H  . insulin aspart  0-5 Units Subcutaneous QHS  . insulin aspart  0-6 Units Subcutaneous TID WC  . metoprolol tartrate  12.5 mg Oral BID  . mometasone-formoterol  2 puff Inhalation BID  . nitroGLYCERIN  1 inch Topical Q6H  . sodium chloride flush  3 mL Intravenous Once  . sodium chloride flush  3 mL Intravenous Q12H  . vitamin E  400 Units Oral Daily   Continuous Infusions: . sodium chloride    . heparin 1,200 Units/hr (11/28/19 0559)     LOS: 2 days      Time spent: 25 minutes   Dessa Phi, DO [facility] 11/28/2019, 10:29 AM   Available via Epic secure chat 7am-7pm After these hours, please refer to coverage provider listed on [email or web address]

## 2019-11-28 NOTE — Progress Notes (Signed)
CARDIAC REHAB PHASE I   Pts CP resolved right now. Discussed sternal precautions, mobility post op, and d/c planning. Gave her ed materials and video to watch. Did not give her IS due to persistent CP with activity and planned surgery tomorrow. We did discuss the importance of using it after surgery. Encouraged her to discuss d/c plan with her family as she lives alone.  [id number]  [place], ACSM 11/28/2019 10:46 AM

## 2019-11-29 ENCOUNTER — Inpatient Hospital Stay (HOSPITAL_COMMUNITY): Payer: Medicare PPO | Admitting: Anesthesiology

## 2019-11-29 ENCOUNTER — Inpatient Hospital Stay (HOSPITAL_COMMUNITY): Payer: Medicare PPO

## 2019-11-29 ENCOUNTER — Encounter (HOSPITAL_COMMUNITY): Payer: Self-pay | Admitting: Internal Medicine

## 2019-11-29 ENCOUNTER — Encounter (HOSPITAL_COMMUNITY)
Admission: EM | Disposition: A | Discharge status: Home / Self Care | Payer: Self-pay | Source: Home / Self Care | Attending: Cardiothoracic Surgery

## 2019-11-29 DIAGNOSIS — I25110 Atherosclerotic heart disease of native coronary artery with unstable angina pectoris: Secondary | ICD-10-CM

## 2019-11-29 DIAGNOSIS — Z951 Presence of aortocoronary bypass graft: Secondary | ICD-10-CM

## 2019-11-29 HISTORY — PX: TEE WITHOUT CARDIOVERSION: SHX5443

## 2019-11-29 HISTORY — PX: ENDOVEIN HARVEST OF GREATER SAPHENOUS VEIN: SHX5059

## 2019-11-29 HISTORY — PX: CORONARY ARTERY BYPASS GRAFT: SHX141

## 2019-11-29 LAB — CBC
HCT: 34.6 % — ABNORMAL LOW (ref 36.0–46.0)
HCT: 23.2 % — ABNORMAL LOW (ref 36.0–46.0)
HCT: 35 % — ABNORMAL LOW (ref 36.0–46.0)
Hemoglobin: 11.1 g/dL — ABNORMAL LOW (ref 12.0–15.0)
Hemoglobin: 7.5 g/dL — ABNORMAL LOW (ref 12.0–15.0)
Hemoglobin: 11.6 g/dL — ABNORMAL LOW (ref 12.0–15.0)
MCH: 30.4 pg (ref 26.0–34.0)
MCH: 30.7 pg (ref 26.0–34.0)
MCH: 30.3 pg (ref 26.0–34.0)
MCHC: 32.1 g/dL (ref 30.0–36.0)
MCHC: 32.3 g/dL (ref 30.0–36.0)
MCHC: 33.1 g/dL (ref 30.0–36.0)
MCV: 94.8 fL (ref 80.0–100.0)
MCV: 95.1 fL (ref 80.0–100.0)
MCV: 91.4 fL (ref 80.0–100.0)
Platelets: 516 10*3/uL — ABNORMAL HIGH (ref 150–400)
Platelets: 205 10*3/uL (ref 150–400)
Platelets: 242 10*3/uL (ref 150–400)
RBC: 3.65 MIL/uL — ABNORMAL LOW (ref 3.87–5.11)
RBC: 2.44 MIL/uL — ABNORMAL LOW (ref 3.87–5.11)
RBC: 3.83 MIL/uL — ABNORMAL LOW (ref 3.87–5.11)
RDW: 13.3 % (ref 11.5–15.5)
RDW: 13.2 % (ref 11.5–15.5)
RDW: 14.3 % (ref 11.5–15.5)
WBC: 9.5 10*3/uL (ref 4.0–10.5)
WBC: 13.4 10*3/uL — ABNORMAL HIGH (ref 4.0–10.5)
WBC: 14.6 10*3/uL — ABNORMAL HIGH (ref 4.0–10.5)
nRBC: 0 % (ref 0.0–0.2)
nRBC: 0 % (ref 0.0–0.2)
nRBC: 0 % (ref 0.0–0.2)

## 2019-11-29 LAB — APTT: aPTT: 33 seconds (ref 24–36)

## 2019-11-29 LAB — BASIC METABOLIC PANEL
Anion gap: 11 (ref 5–15)
Anion gap: 8 (ref 5–15)
BUN: 14 mg/dL (ref 8–23)
BUN: 10 mg/dL (ref 8–23)
CO2: 21 mmol/L — ABNORMAL LOW (ref 22–32)
CO2: 21 mmol/L — ABNORMAL LOW (ref 22–32)
Calcium: 9.3 mg/dL (ref 8.9–10.3)
Calcium: 8.1 mg/dL — ABNORMAL LOW (ref 8.9–10.3)
Chloride: 107 mmol/L (ref 98–111)
Chloride: 109 mmol/L (ref 98–111)
Creatinine, Ser: 0.87 mg/dL (ref 0.44–1.00)
Creatinine, Ser: 0.74 mg/dL (ref 0.44–1.00)
GFR calc Af Amer: >60 mL/min (ref >60)
GFR calc Af Amer: >60 mL/min (ref >60)
GFR calc non Af Amer: >60 mL/min (ref >60)
GFR calc non Af Amer: >60 mL/min (ref >60)
Glucose, Bld: 119 mg/dL — ABNORMAL HIGH (ref 70–99)
Glucose, Bld: 190 mg/dL — ABNORMAL HIGH (ref 70–99)
Potassium: 3.6 mmol/L (ref 3.5–5.1)
Potassium: 4.4 mmol/L (ref 3.5–5.1)
Sodium: 139 mmol/L (ref 135–145)
Sodium: 138 mmol/L (ref 135–145)

## 2019-11-29 LAB — PLATELET COUNT: Platelets: 287 10*3/uL (ref 150–400)

## 2019-11-29 LAB — GLUCOSE, CAPILLARY
Glucose-Capillary: 120 mg/dL — ABNORMAL HIGH (ref 70–99)
Glucose-Capillary: 97 mg/dL (ref 70–99)
Glucose-Capillary: 141 mg/dL — ABNORMAL HIGH (ref 70–99)
Glucose-Capillary: 128 mg/dL — ABNORMAL HIGH (ref 70–99)
Glucose-Capillary: 156 mg/dL — ABNORMAL HIGH (ref 70–99)
Glucose-Capillary: 167 mg/dL — ABNORMAL HIGH (ref 70–99)
Glucose-Capillary: 175 mg/dL — ABNORMAL HIGH (ref 70–99)
Glucose-Capillary: 191 mg/dL — ABNORMAL HIGH (ref 70–99)
Glucose-Capillary: 180 mg/dL — ABNORMAL HIGH (ref 70–99)

## 2019-11-29 LAB — PROTIME-INR
INR: 1.5 — ABNORMAL HIGH (ref 0.8–1.2)
Prothrombin Time: 17.9 seconds — ABNORMAL HIGH (ref 11.4–15.2)

## 2019-11-29 LAB — HEMOGLOBIN AND HEMATOCRIT, BLOOD
HCT: 20.3 % — ABNORMAL LOW (ref 36.0–46.0)
Hemoglobin: 6.5 g/dL — CL (ref 12.0–15.0)

## 2019-11-29 LAB — MAGNESIUM: Magnesium: 2.6 mg/dL — ABNORMAL HIGH (ref 1.7–2.4)

## 2019-11-29 LAB — PREPARE RBC (CROSSMATCH)

## 2019-11-29 LAB — HEPARIN LEVEL (UNFRACTIONATED): Heparin Unfractionated: 0.71 IU/mL — ABNORMAL HIGH (ref 0.30–0.70)

## 2019-11-29 SURGERY — CORONARY ARTERY BYPASS GRAFTING (CABG)
Anesthesia: General | Site: Leg Upper | Laterality: Right

## 2019-11-29 MED ORDER — EPINEPHRINE 1 MG/10ML IJ SOSY
PREFILLED_SYRINGE | INTRAMUSCULAR | Status: AC
  Filled 2019-11-29: qty 20

## 2019-11-29 MED ORDER — ROCURONIUM BROMIDE 10 MG/ML (PF) SYRINGE
PREFILLED_SYRINGE | INTRAVENOUS | Status: AC
  Filled 2019-11-29: qty 30

## 2019-11-29 MED ORDER — SODIUM CHLORIDE 0.9 % IV SOLN
1.5000 g | Freq: Two times a day (BID) | INTRAVENOUS | Status: AC
  Administered 2019-11-29 – 2019-12-01 (×4): 1.5 g via INTRAVENOUS
  Filled 2019-11-29 (×4): qty 1.5

## 2019-11-29 MED ORDER — ONDANSETRON HCL 4 MG/2ML IJ SOLN: 4.0000 mg | Freq: Four times a day (QID) | INTRAMUSCULAR | Status: DC | PRN

## 2019-11-29 MED ORDER — ACETAMINOPHEN 500 MG PO TABS
1000.0000 mg | ORAL_TABLET | Freq: Four times a day (QID) | ORAL | Status: DC
  Administered 2019-11-29 – 2019-12-04 (×18): 1000 mg via ORAL
  Filled 2019-11-29 (×18): qty 2

## 2019-11-29 MED ORDER — METOPROLOL TARTRATE 25 MG/10 ML ORAL SUSPENSION: 12.5000 mg | Freq: Two times a day (BID) | ORAL | Status: DC

## 2019-11-29 MED ORDER — PROTAMINE SULFATE 10 MG/ML IV SOLN
INTRAVENOUS | Status: AC
  Filled 2019-11-29: qty 25

## 2019-11-29 MED ORDER — ACETAMINOPHEN 160 MG/5ML PO SOLN: 1000.0000 mg | Freq: Four times a day (QID) | ORAL | Status: DC

## 2019-11-29 MED ORDER — SODIUM CHLORIDE 0.9% FLUSH
3.0000 mL | Freq: Two times a day (BID) | INTRAVENOUS | Status: DC
  Administered 2019-11-30 – 2019-12-04 (×9): 3 mL via INTRAVENOUS

## 2019-11-29 MED ORDER — FAMOTIDINE IN NACL 20-0.9 MG/50ML-% IV SOLN
INTRAVENOUS | Status: AC
  Administered 2019-11-29: 20 mg via INTRAVENOUS
  Filled 2019-11-29: qty 50

## 2019-11-29 MED ORDER — ASPIRIN 81 MG PO CHEW: 324.0000 mg | CHEWABLE_TABLET | Freq: Every day | ORAL | Status: DC

## 2019-11-29 MED ORDER — MIDAZOLAM HCL 2 MG/2ML IJ SOLN: 2.0000 mg | INTRAMUSCULAR | Status: DC | PRN

## 2019-11-29 MED ORDER — LACTATED RINGERS IV SOLN: 500.0000 mL | Freq: Once | INTRAVENOUS | Status: DC | PRN

## 2019-11-29 MED ORDER — CHLORHEXIDINE GLUCONATE 0.12 % MT SOLN
15.0000 mL | OROMUCOSAL | Status: AC
  Administered 2019-11-29: 15 mL via OROMUCOSAL

## 2019-11-29 MED ORDER — SODIUM BICARBONATE 8.4 % IV SOLN
INTRAVENOUS | Status: AC
  Filled 2019-11-29: qty 100

## 2019-11-29 MED ORDER — ALBUMIN HUMAN 5 % IV SOLN: INTRAVENOUS | Status: DC | PRN

## 2019-11-29 MED ORDER — FENTANYL CITRATE (PF) 250 MCG/5ML IJ SOLN
INTRAMUSCULAR | Status: AC
  Filled 2019-11-29: qty 25

## 2019-11-29 MED ORDER — LACTATED RINGERS IV SOLN: INTRAVENOUS | Status: DC | PRN

## 2019-11-29 MED ORDER — SODIUM CHLORIDE 0.9 % IV SOLN: INTRAVENOUS | Status: DC

## 2019-11-29 MED ORDER — ACETAMINOPHEN 160 MG/5ML PO SOLN: 650.0000 mg | Freq: Once | ORAL | Status: AC

## 2019-11-29 MED ORDER — SODIUM CHLORIDE 0.45 % IV SOLN: INTRAVENOUS | Status: DC | PRN

## 2019-11-29 MED ORDER — FAMOTIDINE IN NACL 20-0.9 MG/50ML-% IV SOLN: 20.0000 mg | Freq: Two times a day (BID) | INTRAVENOUS | Status: AC

## 2019-11-29 MED ORDER — MORPHINE SULFATE (PF) 2 MG/ML IV SOLN
1.0000 mg | INTRAVENOUS | Status: DC | PRN
  Administered 2019-11-29: 1 mg via INTRAVENOUS
  Administered 2019-11-29: 2 mg via INTRAVENOUS
  Filled 2019-11-29 (×2): qty 1

## 2019-11-29 MED ORDER — MIDAZOLAM HCL (PF) 10 MG/2ML IJ SOLN
INTRAMUSCULAR | Status: AC
  Filled 2019-11-29: qty 2

## 2019-11-29 MED ORDER — LIDOCAINE 2% (20 MG/ML) 5 ML SYRINGE
INTRAMUSCULAR | Status: DC | PRN
  Administered 2019-11-29: 60 mg via INTRAVENOUS

## 2019-11-29 MED ORDER — STERILE WATER FOR INJECTION IJ SOLN
INTRAMUSCULAR | Status: AC
  Filled 2019-11-29: qty 10

## 2019-11-29 MED ORDER — BISACODYL 10 MG RE SUPP: 10.0000 mg | Freq: Every day | RECTAL | Status: DC

## 2019-11-29 MED ORDER — PROPOFOL 10 MG/ML IV BOLUS
INTRAVENOUS | Status: DC | PRN
  Administered 2019-11-29: 30 mg via INTRAVENOUS

## 2019-11-29 MED ORDER — POTASSIUM CHLORIDE 10 MEQ/50ML IV SOLN
10.0000 meq | INTRAVENOUS | Status: AC
  Administered 2019-11-29 (×2): 10 meq via INTRAVENOUS

## 2019-11-29 MED ORDER — PROTAMINE SULFATE 10 MG/ML IV SOLN
INTRAVENOUS | Status: AC
  Filled 2019-11-29: qty 5

## 2019-11-29 MED ORDER — DEXTROSE 50 % IV SOLN: 0.0000 mL | INTRAVENOUS | Status: DC | PRN

## 2019-11-29 MED ORDER — ROCURONIUM BROMIDE 10 MG/ML (PF) SYRINGE
PREFILLED_SYRINGE | INTRAVENOUS | Status: DC | PRN
  Administered 2019-11-29: 100 mg via INTRAVENOUS
  Administered 2019-11-29: 20 mg via INTRAVENOUS
  Administered 2019-11-29: 100 mg via INTRAVENOUS
  Administered 2019-11-29: 80 mg via INTRAVENOUS

## 2019-11-29 MED ORDER — OXYCODONE HCL 5 MG PO TABS
5.0000 mg | ORAL_TABLET | ORAL | Status: DC | PRN
  Administered 2019-11-29 – 2019-11-30 (×2): 5 mg via ORAL
  Administered 2019-11-30: 10 mg via ORAL
  Administered 2019-11-30: 5 mg via ORAL
  Filled 2019-11-29 (×2): qty 2
  Filled 2019-11-29 (×3): qty 1

## 2019-11-29 MED ORDER — FENTANYL CITRATE (PF) 250 MCG/5ML IJ SOLN
INTRAMUSCULAR | Status: DC | PRN
  Administered 2019-11-29: 50 ug via INTRAVENOUS
  Administered 2019-11-29: 250 ug via INTRAVENOUS
  Administered 2019-11-29: 100 ug via INTRAVENOUS
  Administered 2019-11-29: 150 ug via INTRAVENOUS
  Administered 2019-11-29: 400 ug via INTRAVENOUS
  Administered 2019-11-29: 150 ug via INTRAVENOUS
  Administered 2019-11-29: 50 ug via INTRAVENOUS
  Administered 2019-11-29: 100 ug via INTRAVENOUS

## 2019-11-29 MED ORDER — BUPIVACAINE HCL (PF) 0.5 % IJ SOLN
INTRAMUSCULAR | Status: AC
  Filled 2019-11-29: qty 30

## 2019-11-29 MED ORDER — 0.9 % SODIUM CHLORIDE (POUR BTL) OPTIME
TOPICAL | Status: DC | PRN
  Administered 2019-11-29: 5000 mL

## 2019-11-29 MED ORDER — SODIUM CHLORIDE 0.9% FLUSH: 3.0000 mL | INTRAVENOUS | Status: DC | PRN

## 2019-11-29 MED ORDER — HEPARIN SODIUM (PORCINE) 1000 UNIT/ML IJ SOLN
INTRAMUSCULAR | Status: AC
  Filled 2019-11-29: qty 2

## 2019-11-29 MED ORDER — ALBUMIN HUMAN 5 % IV SOLN
INTRAVENOUS | Status: AC
  Administered 2019-11-29: 12.5 g via INTRAVENOUS
  Filled 2019-11-29: qty 500

## 2019-11-29 MED ORDER — CALCIUM CHLORIDE 10 % IV SOLN
INTRAVENOUS | Status: AC
  Filled 2019-11-29: qty 10

## 2019-11-29 MED ORDER — STERILE WATER FOR INJECTION IJ SOLN
INTRAMUSCULAR | Status: DC | PRN
  Administered 2019-11-29: 10 mL

## 2019-11-29 MED ORDER — HEPARIN SODIUM (PORCINE) 1000 UNIT/ML IJ SOLN
INTRAMUSCULAR | Status: DC | PRN
  Administered 2019-11-29: 10000 [IU] via INTRAVENOUS
  Administered 2019-11-29: 28000 [IU] via INTRAVENOUS
  Administered 2019-11-29: 5000 [IU] via INTRAVENOUS

## 2019-11-29 MED ORDER — LACTATED RINGERS IV SOLN: INTRAVENOUS | Status: DC

## 2019-11-29 MED ORDER — HEMOSTATIC AGENTS (NO CHARGE) OPTIME
TOPICAL | Status: DC | PRN
  Administered 2019-11-29 (×2): 1 via TOPICAL

## 2019-11-29 MED ORDER — SODIUM CHLORIDE 0.9 % IV SOLN
INTRAVENOUS | Status: DC | PRN
  Administered 2019-11-29: 750 mg via INTRAVENOUS

## 2019-11-29 MED ORDER — HEPARIN SODIUM (PORCINE) 1000 UNIT/ML IJ SOLN
INTRAMUSCULAR | Status: AC
  Filled 2019-11-29: qty 1

## 2019-11-29 MED ORDER — BUPIVACAINE LIPOSOME 1.3 % IJ SUSP
20.0000 mL | INTRAMUSCULAR | Status: AC
  Administered 2019-11-29: 20 mL
  Filled 2019-11-29: qty 20

## 2019-11-29 MED ORDER — DOCUSATE SODIUM 100 MG PO CAPS
200.0000 mg | ORAL_CAPSULE | Freq: Every day | ORAL | Status: DC
  Administered 2019-11-30 – 2019-12-04 (×5): 200 mg via ORAL
  Filled 2019-11-29 (×5): qty 2

## 2019-11-29 MED ORDER — LIDOCAINE 2% (20 MG/ML) 5 ML SYRINGE
INTRAMUSCULAR | Status: AC
  Filled 2019-11-29: qty 5

## 2019-11-29 MED ORDER — NOREPINEPHRINE 4 MG/250ML-% IV SOLN: 0.0000 ug/min | INTRAVENOUS | Status: DC

## 2019-11-29 MED ORDER — CHLORHEXIDINE GLUCONATE CLOTH 2 % EX PADS
6.0000 | MEDICATED_PAD | Freq: Every day | CUTANEOUS | Status: DC
  Administered 2019-12-01: 6 via TOPICAL

## 2019-11-29 MED ORDER — ACETAMINOPHEN 650 MG RE SUPP
RECTAL | Status: AC
  Administered 2019-11-29: 650 mg via RECTAL
  Filled 2019-11-29: qty 1

## 2019-11-29 MED ORDER — VANCOMYCIN HCL 1000 MG IV SOLR
INTRAVENOUS | Status: DC | PRN
  Administered 2019-11-29: 3000 mg

## 2019-11-29 MED ORDER — ASPIRIN EC 325 MG PO TBEC
325.0000 mg | DELAYED_RELEASE_TABLET | Freq: Every day | ORAL | Status: DC
  Administered 2019-11-30: 325 mg via ORAL
  Filled 2019-11-29: qty 1

## 2019-11-29 MED ORDER — VANCOMYCIN HCL 1000 MG IV SOLR
INTRAVENOUS | Status: AC
  Filled 2019-11-29: qty 3000

## 2019-11-29 MED ORDER — POTASSIUM CHLORIDE 10 MEQ/50ML IV SOLN
INTRAVENOUS | Status: AC
  Administered 2019-11-29: 10 meq via INTRAVENOUS
  Filled 2019-11-29: qty 150

## 2019-11-29 MED ORDER — SODIUM CHLORIDE (PF) 0.9 % IJ SOLN
INTRAMUSCULAR | Status: AC
  Filled 2019-11-29: qty 10

## 2019-11-29 MED ORDER — HEMOSTATIC AGENTS (NO CHARGE) OPTIME
TOPICAL | Status: DC | PRN
  Administered 2019-11-29 (×3): 1 via TOPICAL

## 2019-11-29 MED ORDER — SODIUM CHLORIDE 0.9 % IV SOLN: 250.0000 mL | INTRAVENOUS | Status: DC

## 2019-11-29 MED ORDER — ALBUMIN HUMAN 5 % IV SOLN: 250.0000 mL | INTRAVENOUS | Status: AC | PRN

## 2019-11-29 MED ORDER — PANTOPRAZOLE SODIUM 40 MG PO TBEC
40.0000 mg | DELAYED_RELEASE_TABLET | Freq: Every day | ORAL | Status: DC
  Administered 2019-12-01 – 2019-12-04 (×4): 40 mg via ORAL
  Filled 2019-11-29 (×4): qty 1

## 2019-11-29 MED ORDER — NICARDIPINE HCL IN NACL 20-0.86 MG/200ML-% IV SOLN
3.0000 mg/h | INTRAVENOUS | Status: DC
  Administered 2019-11-29: 7.5 mg/h via INTRAVENOUS
  Administered 2019-11-29: 5 mg/h via INTRAVENOUS
  Filled 2019-11-29 (×4): qty 200

## 2019-11-29 MED ORDER — METOPROLOL TARTRATE 5 MG/5ML IV SOLN: 2.5000 mg | INTRAVENOUS | Status: DC | PRN

## 2019-11-29 MED ORDER — VANCOMYCIN HCL IN DEXTROSE 1-5 GM/200ML-% IV SOLN
1000.0000 mg | Freq: Once | INTRAVENOUS | Status: AC
  Administered 2019-11-29: 1000 mg via INTRAVENOUS
  Filled 2019-11-29: qty 200

## 2019-11-29 MED ORDER — NITROGLYCERIN IN D5W 200-5 MCG/ML-% IV SOLN: 0.0000 ug/min | INTRAVENOUS | Status: DC

## 2019-11-29 MED ORDER — DEXMEDETOMIDINE HCL IN NACL 400 MCG/100ML IV SOLN: 0.0000 ug/kg/h | INTRAVENOUS | Status: DC

## 2019-11-29 MED ORDER — MIDAZOLAM HCL 5 MG/5ML IJ SOLN
INTRAMUSCULAR | Status: DC | PRN
  Administered 2019-11-29: 3 mg via INTRAVENOUS
  Administered 2019-11-29 (×2): 2 mg via INTRAVENOUS
  Administered 2019-11-29: 1 mg via INTRAVENOUS
  Administered 2019-11-29: 2 mg via INTRAVENOUS

## 2019-11-29 MED ORDER — SODIUM CHLORIDE 0.9% IV SOLUTION: Freq: Once | INTRAVENOUS | Status: DC

## 2019-11-29 MED ORDER — ACETAMINOPHEN 650 MG RE SUPP: 650.0000 mg | Freq: Once | RECTAL | Status: AC

## 2019-11-29 MED ORDER — BUPIVACAINE HCL (PF) 0.5 % IJ SOLN
INTRAMUSCULAR | Status: DC | PRN
  Administered 2019-11-29: 30 mL

## 2019-11-29 MED ORDER — PHENYLEPHRINE 40 MCG/ML (10ML) SYRINGE FOR IV PUSH (FOR BLOOD PRESSURE SUPPORT)
PREFILLED_SYRINGE | INTRAVENOUS | Status: AC
  Filled 2019-11-29: qty 20

## 2019-11-29 MED ORDER — PROTAMINE SULFATE 10 MG/ML IV SOLN
INTRAVENOUS | Status: DC | PRN
  Administered 2019-11-29: 40 mg via INTRAVENOUS
  Administered 2019-11-29: 60 mg via INTRAVENOUS
  Administered 2019-11-29: 80 mg via INTRAVENOUS
  Administered 2019-11-29: 40 mg via INTRAVENOUS
  Administered 2019-11-29: 60 mg via INTRAVENOUS

## 2019-11-29 MED ORDER — MAGNESIUM SULFATE 4 GM/100ML IV SOLN
4.0000 g | Freq: Once | INTRAVENOUS | Status: AC
  Administered 2019-11-29: 4 g via INTRAVENOUS
  Filled 2019-11-29: qty 100

## 2019-11-29 MED ORDER — TRAMADOL HCL 50 MG PO TABS
50.0000 mg | ORAL_TABLET | ORAL | Status: DC | PRN
  Administered 2019-11-30 – 2019-12-04 (×4): 100 mg via ORAL
  Filled 2019-11-29 (×5): qty 2

## 2019-11-29 MED ORDER — BISACODYL 5 MG PO TBEC
10.0000 mg | DELAYED_RELEASE_TABLET | Freq: Every day | ORAL | Status: DC
  Administered 2019-11-30 – 2019-12-03 (×4): 10 mg via ORAL
  Filled 2019-11-29 (×5): qty 2

## 2019-11-29 MED ORDER — INSULIN REGULAR(HUMAN) IN NACL 100-0.9 UT/100ML-% IV SOLN: INTRAVENOUS | Status: DC

## 2019-11-29 MED ORDER — PROPOFOL 10 MG/ML IV BOLUS
INTRAVENOUS | Status: AC
  Filled 2019-11-29: qty 20

## 2019-11-29 MED ORDER — METOPROLOL TARTRATE 12.5 MG HALF TABLET
12.5000 mg | ORAL_TABLET | Freq: Two times a day (BID) | ORAL | Status: DC
  Administered 2019-11-29 – 2019-12-01 (×5): 12.5 mg via ORAL
  Filled 2019-11-29 (×5): qty 1

## 2019-11-29 SURGICAL SUPPLY — 123 items
ADAPTER CARDIO PERF ANTE/RETRO (ADAPTER) ×5 IMPLANT
APPLIER CLIP 9.375 SM OPEN (CLIP)
BAG DECANTER FOR FLEXI CONT (MISCELLANEOUS) ×5 IMPLANT
BASKET HEART (ORDER IN 25'S) (MISCELLANEOUS) ×1
BASKET HEART (ORDER IN 25S) (MISCELLANEOUS) ×4 IMPLANT
BLADE CLIPPER SURG (BLADE) IMPLANT
BLADE STERNUM SYSTEM 6 (BLADE) ×5 IMPLANT
BLADE SURG 11 STRL SS (BLADE) ×5 IMPLANT
BLADE SURG 15 STRL LF DISP TIS (BLADE) ×4 IMPLANT
BLADE SURG 15 STRL SS (BLADE) ×1
BNDG ELASTIC 4X5.8 VLCR STR LF (GAUZE/BANDAGES/DRESSINGS) ×5 IMPLANT
BNDG ELASTIC 6X5.8 VLCR STR LF (GAUZE/BANDAGES/DRESSINGS) ×5 IMPLANT
BNDG GAUZE ELAST 4 BULKY (GAUZE/BANDAGES/DRESSINGS) ×5 IMPLANT
CANISTER SUCT 3000ML PPV (MISCELLANEOUS) ×5 IMPLANT
CANNULA NON VENT 18FR 12 (CANNULA) ×5 IMPLANT
CATH CPB KIT HENDRICKSON (MISCELLANEOUS) ×5 IMPLANT
CATH RETROPLEGIA CORONARY 14FR (CATHETERS) ×5 IMPLANT
CATH ROBINSON RED A/P 18FR (CATHETERS) ×15 IMPLANT
CLIP APPLIE 9.375 SM OPEN (CLIP) IMPLANT
CLIP RETRACTION 3.0MM CORONARY (MISCELLANEOUS) IMPLANT
CLIP VESOCCLUDE MED 24/CT (CLIP) IMPLANT
CLIP VESOCCLUDE SM WIDE 24/CT (CLIP) ×10 IMPLANT
CONN ST 1/4X3/8  BEN (MISCELLANEOUS) ×2
CONN ST 1/4X3/8 BEN (MISCELLANEOUS) ×8 IMPLANT
COVER MAYO STAND STRL (DRAPES) ×5 IMPLANT
COVER PROBE W GEL 5X96 (DRAPES) ×5 IMPLANT
DEFOGGER ANTIFOG KIT (MISCELLANEOUS) ×5 IMPLANT
DERMABOND ADVANCED (GAUZE/BANDAGES/DRESSINGS) ×4
DERMABOND ADVANCED .7 DNX12 (GAUZE/BANDAGES/DRESSINGS) ×16 IMPLANT
DRAIN CHANNEL 28F RND 3/8 FF (WOUND CARE) ×30 IMPLANT
DRAPE CARDIOVASCULAR INCISE (DRAPES) ×1
DRAPE EXTREMITY T 121X128X90 (DISPOSABLE) IMPLANT
DRAPE HALF SHEET 40X57 (DRAPES) ×5 IMPLANT
DRAPE SLUSH/WARMER DISC (DRAPES) ×5 IMPLANT
DRAPE SRG 135X102X78XABS (DRAPES) ×4 IMPLANT
DRSG AQUACEL AG ADV 3.5X14 (GAUZE/BANDAGES/DRESSINGS) ×5 IMPLANT
ELECT BLADE 4.0 EZ CLEAN MEGAD (MISCELLANEOUS) ×5
ELECT CAUTERY BLADE 6.4 (BLADE) ×5 IMPLANT
ELECT REM PT RETURN 9FT ADLT (ELECTROSURGICAL) ×10
ELECTRODE BLDE 4.0 EZ CLN MEGD (MISCELLANEOUS) ×4 IMPLANT
ELECTRODE REM PT RTRN 9FT ADLT (ELECTROSURGICAL) ×8 IMPLANT
FELT TEFLON 1X6 (MISCELLANEOUS) ×5 IMPLANT
GAUZE SPONGE 4X4 12PLY STRL (GAUZE/BANDAGES/DRESSINGS) ×10 IMPLANT
GAUZE SPONGE 4X4 12PLY STRL LF (GAUZE/BANDAGES/DRESSINGS) ×10 IMPLANT
GEL ULTRASOUND 20GR AQUASONIC (MISCELLANEOUS) ×5 IMPLANT
GLOVE BIO SURGEON STRL SZ 6 (GLOVE) ×10 IMPLANT
GLOVE BIO SURGEON STRL SZ7.5 (GLOVE) ×5 IMPLANT
GLOVE BIOGEL M 6.5 STRL (GLOVE) ×15 IMPLANT
GLOVE BIOGEL PI IND STRL 6 (GLOVE) ×12 IMPLANT
GLOVE BIOGEL PI IND STRL 6.5 (GLOVE) ×12 IMPLANT
GLOVE BIOGEL PI IND STRL 7.5 (GLOVE) ×8 IMPLANT
GLOVE BIOGEL PI IND STRL 9 (GLOVE) ×4 IMPLANT
GLOVE BIOGEL PI INDICATOR 6 (GLOVE) ×3
GLOVE BIOGEL PI INDICATOR 6.5 (GLOVE) ×3
GLOVE BIOGEL PI INDICATOR 7.5 (GLOVE) ×2
GLOVE BIOGEL PI INDICATOR 9 (GLOVE) ×1
GLOVE NEODERM STRL 7.5 LF PF (GLOVE) ×12 IMPLANT
GLOVE SURG NEODERM 7.5  LF PF (GLOVE) ×3
GOWN STRL REUS W/ TWL LRG LVL3 (GOWN DISPOSABLE) ×40 IMPLANT
GOWN STRL REUS W/TWL LRG LVL3 (GOWN DISPOSABLE) ×10
HEMOSTAT POWDER SURGIFOAM 1G (HEMOSTASIS) ×10 IMPLANT
INSERT FOGARTY XLG (MISCELLANEOUS) ×5 IMPLANT
KIT BASIN OR (CUSTOM PROCEDURE TRAY) ×5 IMPLANT
KIT SUCTION CATH 14FR (SUCTIONS) ×5 IMPLANT
KIT TURNOVER KIT B (KITS) ×5 IMPLANT
KIT VASOVIEW HEMOPRO 2 VH 4000 (KITS) ×5 IMPLANT
MARKER GRAFT CORONARY BYPASS (MISCELLANEOUS) ×10 IMPLANT
NEEDLE 18GX1X1/2 (RX/OR ONLY) (NEEDLE) ×5 IMPLANT
NS IRRIG 1000ML POUR BTL (IV SOLUTION) ×25 IMPLANT
PACK E OPEN HEART (SUTURE) ×5 IMPLANT
PACK OPEN HEART (CUSTOM PROCEDURE TRAY) ×5 IMPLANT
PACK SPY-PHI (KITS) ×5 IMPLANT
PAD ARMBOARD 7.5X6 YLW CONV (MISCELLANEOUS) ×5 IMPLANT
PAD ELECT DEFIB RADIOL ZOLL (MISCELLANEOUS) ×5 IMPLANT
PENCIL BUTTON HOLSTER BLD 10FT (ELECTRODE) ×5 IMPLANT
POSITIONER HEAD DONUT 9IN (MISCELLANEOUS) ×5 IMPLANT
POWDER SURGICEL 3.0 GRAM (HEMOSTASIS) ×5 IMPLANT
SEALANT SURG COSEAL 8ML (VASCULAR PRODUCTS) ×5 IMPLANT
SET CARDIOPLEGIA MPS 5001102 (MISCELLANEOUS) ×5 IMPLANT
SHEARS HARMONIC 9CM CVD (BLADE) IMPLANT
SHEARS HARMONIC STRL 23CM (MISCELLANEOUS) IMPLANT
STOPCOCK 3 WAY HIGH PRESSURE (MISCELLANEOUS) ×1
STOPCOCK 3WAY HIGH PRESSURE (MISCELLANEOUS) ×4 IMPLANT
SUT BONE WAX W31G (SUTURE) ×5 IMPLANT
SUT MNCRL AB 3-0 PS2 18 (SUTURE) ×10 IMPLANT
SUT MNCRL AB 4-0 PS2 18 (SUTURE) ×5 IMPLANT
SUT PDS AB 1 CTX 36 (SUTURE) ×10 IMPLANT
SUT PROLENE 3 0 SH DA (SUTURE) ×15 IMPLANT
SUT PROLENE 4 0 RB 1 (SUTURE) ×1
SUT PROLENE 4 0 SH DA (SUTURE) ×5 IMPLANT
SUT PROLENE 4-0 RB1 .5 CRCL 36 (SUTURE) ×4 IMPLANT
SUT PROLENE 5 0 C 1 36 (SUTURE) IMPLANT
SUT PROLENE 6 0 C 1 30 (SUTURE) ×15 IMPLANT
SUT PROLENE 8 0 BV175 6 (SUTURE) ×5 IMPLANT
SUT PROLENE BLUE 7 0 (SUTURE) ×5 IMPLANT
SUT SILK  1 MH (SUTURE) ×1
SUT SILK 1 MH (SUTURE) ×4 IMPLANT
SUT SILK 2 0 SH CR/8 (SUTURE) IMPLANT
SUT SILK 3 0 SH CR/8 (SUTURE) IMPLANT
SUT STEEL 6MS V (SUTURE) ×5 IMPLANT
SUT STEEL SZ 6 DBL 3X14 BALL (SUTURE) ×5 IMPLANT
SUT VIC AB 2-0 CT1 27 (SUTURE) ×1
SUT VIC AB 2-0 CT1 TAPERPNT 27 (SUTURE) ×4 IMPLANT
SUT VIC AB 2-0 CTX 27 (SUTURE) IMPLANT
SUT VIC AB 3-0 SH 27 (SUTURE)
SUT VIC AB 3-0 SH 27X BRD (SUTURE) IMPLANT
SUT VIC AB 3-0 X1 27 (SUTURE) IMPLANT
SYR 10ML LL (SYRINGE) ×10 IMPLANT
SYR 30ML LL (SYRINGE) ×10 IMPLANT
SYR 3ML LL SCALE MARK (SYRINGE) ×5 IMPLANT
SYR 50ML SLIP (SYRINGE) IMPLANT
SYSTEM SAHARA CHEST DRAIN ATS (WOUND CARE) ×5 IMPLANT
TAPE CLOTH SURG 4X10 WHT LF (GAUZE/BANDAGES/DRESSINGS) ×5 IMPLANT
TAPE PAPER 2X10 WHT MICROPORE (GAUZE/BANDAGES/DRESSINGS) ×5 IMPLANT
TOWEL GREEN STERILE (TOWEL DISPOSABLE) ×5 IMPLANT
TOWEL GREEN STERILE FF (TOWEL DISPOSABLE) IMPLANT
TRAY FOLEY SLVR 16FR TEMP STAT (SET/KITS/TRAYS/PACK) ×5 IMPLANT
TUBING ART PRESS 48 MALE/FEM (TUBING) ×10 IMPLANT
TUBING LAP HI FLOW INSUFFLATIO (TUBING) ×5 IMPLANT
UNDERPAD 30X30 (UNDERPADS AND DIAPERS) ×5 IMPLANT
WATER STERILE IRR 1000ML POUR (IV SOLUTION) ×10 IMPLANT
WATER STERILE IRR 1000ML UROMA (IV SOLUTION) ×5 IMPLANT
YANKAUER SUCT BULB TIP NO VENT (SUCTIONS) ×5 IMPLANT

## 2019-11-29 NOTE — Progress Notes (Signed)
RT assessed pts readiness for extubation. Pt had good effort with NIF -22 and VC .72 Lpm. RT will proceed with extubation at this time.

## 2019-11-29 NOTE — Anesthesia Procedure Notes (Signed)
Arterial Line Insertion Start/End2/17/2021 8:25 AM, 11/29/2019 8:35 AM Performed by: Suleyman Ehrman T, Immunologist, CRNA  Patient location: Pre-op. Preanesthetic checklist: patient identified, IV checked, risks and benefits discussed, surgical consent, monitors and equipment checked and pre-op evaluation Lidocaine 1% used for infiltration and patient sedated Right, radial was placed Catheter size: 20 G Hand hygiene performed  and maximum sterile barriers used   Attempts: 2 Procedure performed without using ultrasound guided technique. Following insertion, dressing applied and Biopatch. Post procedure assessment: normal  Patient tolerated the procedure well with no immediate complications.

## 2019-11-29 NOTE — H&P (Signed)
History and Physical Interval Note:  11/29/2019 8:38 AM  Miranda [name]  has presented today for surgery, with the diagnosis of severe CAD.  The various methods of treatment have been discussed with the patient and family. After consideration of risks, benefits and other options for treatment, the patient has consented to  Procedure(s) with comments: CORONARY ARTERY BYPASS GRAFTING (CABG) (N/A) - bilateral IMA RADIAL ARTERY HARVEST (Bilateral) TRANSESOPHAGEAL ECHOCARDIOGRAM (TEE) (N/A) INDOCYANINE GREEN FLUORESCENCE IMAGING (ICG) (N/A) as a surgical intervention.  The patient's history has been reviewed, patient examined, no change in status, stable for surgery.  I have reviewed the patient's chart and labs.  Questions were answered to the patient's satisfaction.     Wonda Olds

## 2019-11-29 NOTE — Transfer of Care (Signed)
Immediate Anesthesia Transfer of Care Note  Patient: Miranda [name]  Procedure(s) Performed: CORONARY ARTERY BYPASS GRAFTING (CABG) using the LIMA to LAD; RIMA to PL; Endoscopic harvested right greater saphenous vein: SVC to Diag; SVC to OM1. (N/A Chest) TRANSESOPHAGEAL ECHOCARDIOGRAM (TEE) (N/A ) INDOCYANINE GREEN FLUORESCENCE IMAGING (ICG) (N/A ) Endovein Harvest Of Greater Saphenous Vein (Right Leg Upper)  Patient Location: ICU  Anesthesia Type:General  Level of Consciousness: Patient remains intubated per anesthesia plan  Airway & Oxygen Therapy: Patient remains intubated per anesthesia plan and Patient placed on Ventilator (see vital sign flow sheet for setting)  Post-op Assessment: Report given to RN and Post -op Vital signs reviewed and stable  Post vital signs: Reviewed and stable  Last Vitals:  Vitals Value Taken Time  BP    Temp 35.8 C 11/29/19 1510  Pulse 85 11/29/19 1501  Resp 17 11/29/19 1510  SpO2 100 % 11/29/19 1501  Vitals shown include unvalidated device data.  Last Pain:  Vitals:   11/29/19 0457  TempSrc: Oral  PainSc: 0-No pain         Complications: No apparent anesthesia complications

## 2019-11-29 NOTE — Progress Notes (Signed)
RT assessed pts readiness to wean to next phase of wean protocol. Pt able to follow commands and do all task as requested by RT without difficulty. Pt was placed in CPAP/PSV mode on 10/5 40%. Pt tolerating well. RT will reassess pts readiness for next phase of wean (NIF/VC). RT will continue to monitor.

## 2019-11-29 NOTE — Progress Notes (Signed)
CT surgery p.m. Rounds  Patient hemodynamically stable after CABG x4 Remains intubated on ventilator support not fully awake from anesthesia but starting to respond Chest tubes with minimal drainage Doing well

## 2019-11-29 NOTE — Brief Op Note (Addendum)
11/25/2019 - 11/29/2019  1:38 PM  PATIENT:  Miranda [name]  69 y.o. female  PRE-OPERATIVE DIAGNOSIS:  SEVERE CORONARY ARTERY DISEASE  POST-OPERATIVE DIAGNOSIS:  SEVERE CORONARY ARTERY DISEASE  PROCEDURE:  Procedure(s) with comments: CORONARY ARTERY BYPASS GRAFTING (CABG) using the LIMA to LAD; RIMA to PL; Endoscopic harvested right greater saphenous vein: SVC to Diag; SVC to OM1. (N/A) - bilateral IMA TRANSESOPHAGEAL ECHOCARDIOGRAM (TEE) (N/A) INDOCYANINE GREEN FLUORESCENCE IMAGING (ICG) (N/A) Endovein Harvest Of Greater Saphenous Vein (Right)  SURGEON:  Surgeon(s) and Role:    * Wonda Olds, MD - Primary  PHYSICIAN ASSISTANT: [place]  ANESTHESIA:   general  EBL: per perfusion and anesthesia notes  BLOOD ADMINISTERED: PRBC's and FFP  DRAINS: Bilateral pleural drains, mediastinal drain.    LOCAL MEDICATIONS USED:  MARCAINE     SPECIMEN:  No Specimen  DISPOSITION OF SPECIMEN:  N/A  COUNTS:  YES  DICTATION: .Dragon Dictation  PLAN OF CARE: Admit to inpatient   PATIENT DISPOSITION:  ICU - intubated and hemodynamically stable.   Delay start of Pharmacological VTE agent (>24hrs) due to surgical blood loss or risk of bleeding: yes  Agree with above notation in addition to : epiaortic ultrasonography prior to instituting CPB.  Tykeisha Peer Z. Orvan Seen, [place]

## 2019-11-29 NOTE — Op Note (Signed)
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 Murvin Natal, MD  Assistant: Macarthur Critchley, PA-C  Anesthesia: get  Operative Findings:  Preserved left ventricular systolic function  Good quality internal mammary artery conduits  Good quality saphenous vein conduit  Fair quality target vessels for grafting    BRIEF CLINICAL NOTE AND INDICATIONS FOR SURGERY  69 yo lady with h/o PE presented earlier this week with sudden onset CP which was quite new and different than any pain ever experienced. Work-up showed NSTEMI followed by LHC, which showed severe multivessel CAD including 95% LAD occlusion, near-total occlusion of LCx and 80% RCA. She is referred to surgery for CABG. Having been deemed an appropriate candidate for surgery, she is taken to the OR for CABG.    DETAILS OF THE OPERATIVE PROCEDURE  Preparation:  The patient is brought to the operating room on the above mentioned date and central monitoring was established by the anesthesia team including placement of Swan-Ganz catheter and radial arterial line. The patient is placed in the supine position on the operating table.  Intravenous antibiotics are administered. General endotracheal anesthesia is induced uneventfully. A Foley catheter is placed.  Baseline transesophageal echocardiogram was  performed.  Findings were notable for preserved biventricular function and no significant valvular abnormalities  The patient's chest, abdomen, both groins, and both lower extremities are prepared and draped in a sterile manner. A time out procedure is performed.   Surgical Approach and Conduit Harvest:  A median sternotomy incision was performed and the left internal mammary artery is dissected from the chest wall and prepared for bypass grafting. The left internal mammary artery is notably good quality conduit. Simultaneously, the greater saphenous vein is obtained from the patient's right thigh using endoscopic vein harvest technique. The saphenous vein is notably good quality conduit. After removal of the saphenous vein, the small surgical incisions in the lower extremity are closed with absorbable suture. Attention is turned to the right chest wall where the Right IMA is mobilized in a standard fashion. Prior to dividing the pedicle distally, 28,000 units of unfractionated heparin was delivered intravenously. Both IMA conduits are divided and treated with papaverine solution. The interspaces on each side of the chest are injected with Exparel solution for augmenting postoperative analgesia.   Extracorporeal Cardiopulmonary Bypass and Myocardial Protection:  The pericardium is opened. The ascending aorta is normal in appearance, but known to be diseased by preoperative imaging. Epiaortic ultrasonography is performed, demonstrating nonmobile plaquing spread diffusely. The ascending aorta and the right atrium are cannulated for cardiopulmonary bypass.  Adequate heparinization is verified. A retrograde cardioplegia cannula is placed through the right atrium into the coronary sinus. The entire pre-bypass portion of the operation was notable for stable hemodynamics.  Cardiopulmonary bypass was begun and the surface of the heart is inspected. Distal target vessels are selected for coronary artery bypass  grafting. A cardioplegia cannula is placed in the ascending aorta.  The patient is allowed to cool passively to  34C systemic temperature.  The aortic cross clamp is applied and cold blood cardioplegia is delivered initially in an antegrade fashion through the aortic root.  Supplemental cardioplegia is given retrograde through the coronary sinus catheter.  Iced saline slush is applied for topical hypothermia.  The initial cardioplegic arrest is rapid with early diastolic arrest.  Repeat doses of cardioplegia are administered intermittently throughout the entire cross clamp portion of the operation through the aortic root, through the coronary sinus catheter, and through subsequently placed vein grafts in order to maintain completely flat electrocardiogram.   Coronary Artery Bypass Grafting:   The  obtuse marginal branch of the left circumflex coronary artery was grafted using a reversed saphenous vein graft in an end-to-side fashion.  At the site of distal anastomosis the target vessel was fair quality and measured approximately 1.5 mm in diameter.  The diagonal branch of the left anterior descending coronary artery was grafted using a reversed saphenous vein graft in an end-to-side fashion.  At the site of distal anastomosis the target vessel was good quality and measured approximately 1.5 mm in diameter.  The distal left anterior coronary artery was grafted with the left internal mammary artery in an end-to-side fashion.  At the site of distal anastomosis the target vessel was good quality and measured approximately 2 mm in diameter. Anastomotic patency and runoff was confirmed with indocyanine green fluorescence imaging (SPY).  The posterolateral branch of the right coronary artery was grafted using a reversed saphenous vein graft in an end-to-side fashion.  At the site of distal anastomosis the target vessel was fair quality and measured approximately 1.5 mm in diameter.Anastomotic patency and  runoff was confirmed with indocyanine green fluorescence imaging (SPY). All proximal vein graft anastomoses were placed directly to the ascending aorta prior to removal of the aortic cross clamp.  Dearing was performed and the aortic cross-clamp was removed carefully.    Procedure Completion:  All proximal and distal coronary anastomoses were inspected for hemostasis and appropriate graft orientation. Epicardial pacing wires are fixed to the right ventricular outflow tract and to the right atrial appendage. The patient is rewarmed to 37C temperature. The patient is weaned and disconnected from cardiopulmonary bypass.  The patient's rhythm at separation from bypass was NSR.  The patient was weaned from cardiopulmonary bypass without any inotropic support.  Followup transesophageal echocardiogram performed after separation from bypass revealed  no changes from the preoperative exam.  The aortic and venous cannula were removed uneventfully. Protamine was administered to reverse the anticoagulation. The mediastinum and pleural space were inspected for hemostasis and irrigated with saline solution. The mediastinum and bilateral pleural spaces were drained using fluted chest tubes placed through separate stab incisions inferiorly.  The soft tissues anterior to the aorta were reapproximated loosely. The sternum is closed with double strength sternal wire. The soft tissues anterior to the sternum were closed in multiple layers and the skin is closed with a running subcuticular skin closure.  The post-bypass portion of the operation was notable for stable rhythm and hemodynamics.   Disposition:  The patient tolerated the procedure well and is transported to the surgical intensive care in stable condition. There are no intraoperative complications. All sponge instrument and needle counts are verified correct at completion of the operation.    Jayme Cloud, MD 11/29/2019 6:26 PM

## 2019-11-29 NOTE — Procedures (Signed)
Extubation Procedure Note  Patient Details:   Name: Miranda [name] DOB: 19-Jun-1951 MRN: [id number]   Airway Documentation:    Vent end date: 11/29/19 Vent end time: 2120   Evaluation  O2 sats: stable throughout Complications: No apparent complications Patient did tolerate procedure well. Bilateral Breath Sounds: Clear, Diminished   Yes  Pt NIF -22  VC .72. Pt able to speak name, productive cough, no stridor noted, pt had cuff leak. Pt place on [place] 4 Lpm humidified w/sats of 99%.   [name] [name] 11/29/2019, 9:29 PM

## 2019-11-29 NOTE — Progress Notes (Signed)
All belongings given to sister Beverlee Nims

## 2019-11-29 NOTE — Anesthesia Procedure Notes (Signed)
Central Venous Catheter Insertion Performed by: Suzette Battiest, MD, anesthesiologist Start/End2/17/2021 7:45 AM, 11/29/2019 8:00 AM Patient location: Pre-op. Preanesthetic checklist: patient identified, IV checked, site marked, risks and benefits discussed, surgical consent, monitors and equipment checked, pre-op evaluation, timeout performed and anesthesia consent Hand hygiene performed  and maximum sterile barriers used  PA cath was placed.Swan type:thermodilution Procedure performed without using ultrasound guided technique. Attempts: 1 Patient tolerated the procedure well with no immediate complications.

## 2019-11-29 NOTE — Anesthesia Procedure Notes (Signed)
Procedure Name: Intubation Date/Time: 11/29/2019 9:28 AM Performed by: Yanelie Abraha T, CRNA Pre-anesthesia Checklist: Patient identified, Emergency Drugs available, Suction available and Patient being monitored Patient Re-evaluated:Patient Re-evaluated prior to induction Oxygen Delivery Method: Circle system utilized Preoxygenation: Pre-oxygenation with 100% oxygen Induction Type: IV induction Ventilation: Mask ventilation without difficulty Laryngoscope Size: Mac and 3 Grade View: Grade III Tube type: Oral Tube size: 7.5 mm Number of attempts: 2 Airway Equipment and Method: Stylet and Oral airway Placement Confirmation: ETT inserted through vocal cords under direct vision,  positive ETCO2 and breath sounds checked- equal and bilateral Secured at: 22 cm Tube secured with: Tape Dental Injury: Teeth and Oropharynx as per pre-operative assessment  Difficulty Due To: Difficulty was anticipated and Difficult Airway- due to limited oral opening Future Recommendations: Recommend- induction with short-acting agent, and alternative techniques readily available

## 2019-11-29 NOTE — Progress Notes (Signed)
RT assessed pts readiness to wean per heart wean protocol. Pt able to hold head off of pillow for 20+ secs, squeeze hands, and wiggle toes, able to stick tongue out upon request. Pt placed on rate 4, FIO2 o f40% per heart wean protocol. RT will assess pt for next phase of wean. RT will continue to monitor.

## 2019-11-29 NOTE — Progress Notes (Addendum)
Report call to [facility]  [phone]. She is aware patient  Has NTG Paste on Rt. Arm.

## 2019-11-29 NOTE — Anesthesia Procedure Notes (Signed)
Central Venous Catheter Insertion Performed by: Suzette Battiest, MD, anesthesiologist Start/End2/17/2021 7:45 AM, 11/29/2019 8:00 AM Patient location: Pre-op. Preanesthetic checklist: patient identified, IV checked, site marked, risks and benefits discussed, surgical consent, monitors and equipment checked, pre-op evaluation, timeout performed and anesthesia consent Position: Trendelenburg Lidocaine 1% used for infiltration and patient sedated Hand hygiene performed , maximum sterile barriers used  and Seldinger technique used Catheter size: 9 Fr Total catheter length 10. Central line and PA cath was placed.MAC introducer Swan type:thermodilution PA Cath depth:40 Procedure performed using ultrasound guided technique. Ultrasound Notes:anatomy identified, needle tip was noted to be adjacent to the nerve/plexus identified, no ultrasound evidence of intravascular and/or intraneural injection and image(s) printed for medical record Attempts: 1 Following insertion, line sutured, dressing applied and Biopatch. Post procedure assessment: blood return through all ports, free fluid flow and no air  Patient tolerated the procedure well with no immediate complications.

## 2019-11-30 ENCOUNTER — Inpatient Hospital Stay (HOSPITAL_COMMUNITY): Payer: Medicare PPO

## 2019-11-30 ENCOUNTER — Telehealth: Payer: Self-pay | Admitting: Physician Assistant

## 2019-11-30 LAB — CBC
HCT: 32.6 % — ABNORMAL LOW (ref 36.0–46.0)
HCT: 34.8 % — ABNORMAL LOW (ref 36.0–46.0)
Hemoglobin: 10.9 g/dL — ABNORMAL LOW (ref 12.0–15.0)
Hemoglobin: 11.4 g/dL — ABNORMAL LOW (ref 12.0–15.0)
MCH: 30.5 pg (ref 26.0–34.0)
MCH: 30.2 pg (ref 26.0–34.0)
MCHC: 33.4 g/dL (ref 30.0–36.0)
MCHC: 32.8 g/dL (ref 30.0–36.0)
MCV: 91.3 fL (ref 80.0–100.0)
MCV: 92.1 fL (ref 80.0–100.0)
Platelets: 243 10*3/uL (ref 150–400)
Platelets: 266 10*3/uL (ref 150–400)
RBC: 3.57 MIL/uL — ABNORMAL LOW (ref 3.87–5.11)
RBC: 3.78 MIL/uL — ABNORMAL LOW (ref 3.87–5.11)
RDW: 14.6 % (ref 11.5–15.5)
RDW: 14.9 % (ref 11.5–15.5)
WBC: 11.8 10*3/uL — ABNORMAL HIGH (ref 4.0–10.5)
WBC: 14.4 10*3/uL — ABNORMAL HIGH (ref 4.0–10.5)
nRBC: 0 % (ref 0.0–0.2)
nRBC: 0 % (ref 0.0–0.2)

## 2019-11-30 LAB — POCT I-STAT 7, (LYTES, BLD GAS, ICA,H+H)
Acid-base deficit: 1 mmol/L (ref 0.0–2.0)
Acid-base deficit: 2 mmol/L (ref 0.0–2.0)
Acid-base deficit: 1 mmol/L (ref 0.0–2.0)
Acid-base deficit: 3 mmol/L — ABNORMAL HIGH (ref 0.0–2.0)
Bicarbonate: 22 mmol/L (ref 20.0–28.0)
Bicarbonate: 23 mmol/L (ref 20.0–28.0)
Bicarbonate: 23.8 mmol/L (ref 20.0–28.0)
Bicarbonate: 22.1 mmol/L (ref 20.0–28.0)
Calcium, Ion: 0.95 mmol/L — ABNORMAL LOW (ref 1.15–1.40)
Calcium, Ion: 1.12 mmol/L — ABNORMAL LOW (ref 1.15–1.40)
Calcium, Ion: 1.15 mmol/L (ref 1.15–1.40)
Calcium, Ion: 1.18 mmol/L (ref 1.15–1.40)
HCT: 28 % — ABNORMAL LOW (ref 36.0–46.0)
HCT: 25 % — ABNORMAL LOW (ref 36.0–46.0)
HCT: 24 % — ABNORMAL LOW (ref 36.0–46.0)
HCT: 34 % — ABNORMAL LOW (ref 36.0–46.0)
Hemoglobin: 9.5 g/dL — ABNORMAL LOW (ref 12.0–15.0)
Hemoglobin: 8.5 g/dL — ABNORMAL LOW (ref 12.0–15.0)
Hemoglobin: 8.2 g/dL — ABNORMAL LOW (ref 12.0–15.0)
Hemoglobin: 11.6 g/dL — ABNORMAL LOW (ref 12.0–15.0)
O2 Saturation: 100 %
O2 Saturation: 100 %
O2 Saturation: 99 %
O2 Saturation: 97 %
Patient temperature: 35.8
Patient temperature: 36.7
Potassium: 3.8 mmol/L (ref 3.5–5.1)
Potassium: 4.3 mmol/L (ref 3.5–5.1)
Potassium: 3.8 mmol/L (ref 3.5–5.1)
Potassium: 4.4 mmol/L (ref 3.5–5.1)
Sodium: 142 mmol/L (ref 135–145)
Sodium: 143 mmol/L (ref 135–145)
Sodium: 144 mmol/L (ref 135–145)
Sodium: 141 mmol/L (ref 135–145)
TCO2: 23 mmol/L (ref 22–32)
TCO2: 24 mmol/L (ref 22–32)
TCO2: 25 mmol/L (ref 22–32)
TCO2: 23 mmol/L (ref 22–32)
pCO2 arterial: 28.2 mmHg — ABNORMAL LOW (ref 32.0–48.0)
pCO2 arterial: 40.8 mmHg (ref 32.0–48.0)
pCO2 arterial: 37.3 mmHg (ref 32.0–48.0)
pCO2 arterial: 38.4 mmHg (ref 32.0–48.0)
pH, Arterial: 7.499 — ABNORMAL HIGH (ref 7.350–7.450)
pH, Arterial: 7.36 (ref 7.350–7.450)
pH, Arterial: 7.407 (ref 7.350–7.450)
pH, Arterial: 7.366 (ref 7.350–7.450)
pO2, Arterial: 406 mmHg — ABNORMAL HIGH (ref 83.0–108.0)
pO2, Arterial: 318 mmHg — ABNORMAL HIGH (ref 83.0–108.0)
pO2, Arterial: 143 mmHg — ABNORMAL HIGH (ref 83.0–108.0)
pO2, Arterial: 96 mmHg (ref 83.0–108.0)

## 2019-11-30 LAB — POCT I-STAT, CHEM 8
BUN: 13 mg/dL (ref 8–23)
BUN: 12 mg/dL (ref 8–23)
BUN: 10 mg/dL (ref 8–23)
BUN: 11 mg/dL (ref 8–23)
BUN: 11 mg/dL (ref 8–23)
Calcium, Ion: 1.3 mmol/L (ref 1.15–1.40)
Calcium, Ion: 1.25 mmol/L (ref 1.15–1.40)
Calcium, Ion: 0.95 mmol/L — ABNORMAL LOW (ref 1.15–1.40)
Calcium, Ion: 1.17 mmol/L (ref 1.15–1.40)
Calcium, Ion: 1.14 mmol/L — ABNORMAL LOW (ref 1.15–1.40)
Chloride: 106 mmol/L (ref 98–111)
Chloride: 108 mmol/L (ref 98–111)
Chloride: 103 mmol/L (ref 98–111)
Chloride: 110 mmol/L (ref 98–111)
Chloride: 109 mmol/L (ref 98–111)
Creatinine, Ser: 0.6 mg/dL (ref 0.44–1.00)
Creatinine, Ser: 0.7 mg/dL (ref 0.44–1.00)
Creatinine, Ser: 0.4 mg/dL — ABNORMAL LOW (ref 0.44–1.00)
Creatinine, Ser: 0.6 mg/dL (ref 0.44–1.00)
Creatinine, Ser: 0.6 mg/dL (ref 0.44–1.00)
Glucose, Bld: 111 mg/dL — ABNORMAL HIGH (ref 70–99)
Glucose, Bld: 99 mg/dL (ref 70–99)
Glucose, Bld: 84 mg/dL (ref 70–99)
Glucose, Bld: 189 mg/dL — ABNORMAL HIGH (ref 70–99)
Glucose, Bld: 163 mg/dL — ABNORMAL HIGH (ref 70–99)
HCT: 39 % (ref 36.0–46.0)
HCT: 27 % — ABNORMAL LOW (ref 36.0–46.0)
HCT: 21 % — ABNORMAL LOW (ref 36.0–46.0)
HCT: 25 % — ABNORMAL LOW (ref 36.0–46.0)
HCT: 20 % — ABNORMAL LOW (ref 36.0–46.0)
Hemoglobin: 13.3 g/dL (ref 12.0–15.0)
Hemoglobin: 9.2 g/dL — ABNORMAL LOW (ref 12.0–15.0)
Hemoglobin: 7.1 g/dL — ABNORMAL LOW (ref 12.0–15.0)
Hemoglobin: 8.5 g/dL — ABNORMAL LOW (ref 12.0–15.0)
Hemoglobin: 6.8 g/dL — CL (ref 12.0–15.0)
Potassium: 3.4 mmol/L — ABNORMAL LOW (ref 3.5–5.1)
Potassium: 3.7 mmol/L (ref 3.5–5.1)
Potassium: 3.8 mmol/L (ref 3.5–5.1)
Potassium: 5.1 mmol/L (ref 3.5–5.1)
Potassium: 4.3 mmol/L (ref 3.5–5.1)
Sodium: 142 mmol/L (ref 135–145)
Sodium: 141 mmol/L (ref 135–145)
Sodium: 141 mmol/L (ref 135–145)
Sodium: 140 mmol/L (ref 135–145)
Sodium: 142 mmol/L (ref 135–145)
TCO2: 27 mmol/L (ref 22–32)
TCO2: 23 mmol/L (ref 22–32)
TCO2: 27 mmol/L (ref 22–32)
TCO2: 26 mmol/L (ref 22–32)
TCO2: 26 mmol/L (ref 22–32)

## 2019-11-30 LAB — BASIC METABOLIC PANEL
Anion gap: 7 (ref 5–15)
Anion gap: 10 (ref 5–15)
BUN: 8 mg/dL (ref 8–23)
BUN: 11 mg/dL (ref 8–23)
CO2: 23 mmol/L (ref 22–32)
CO2: 22 mmol/L (ref 22–32)
Calcium: 8.2 mg/dL — ABNORMAL LOW (ref 8.9–10.3)
Calcium: 8.7 mg/dL — ABNORMAL LOW (ref 8.9–10.3)
Chloride: 107 mmol/L (ref 98–111)
Chloride: 104 mmol/L (ref 98–111)
Creatinine, Ser: 0.69 mg/dL (ref 0.44–1.00)
Creatinine, Ser: 0.78 mg/dL (ref 0.44–1.00)
GFR calc Af Amer: >60 mL/min (ref >60)
GFR calc Af Amer: >60 mL/min (ref >60)
GFR calc non Af Amer: >60 mL/min (ref >60)
GFR calc non Af Amer: >60 mL/min (ref >60)
Glucose, Bld: 122 mg/dL — ABNORMAL HIGH (ref 70–99)
Glucose, Bld: 100 mg/dL — ABNORMAL HIGH (ref 70–99)
Potassium: 4.3 mmol/L (ref 3.5–5.1)
Potassium: 4 mmol/L (ref 3.5–5.1)
Sodium: 137 mmol/L (ref 135–145)
Sodium: 136 mmol/L (ref 135–145)

## 2019-11-30 LAB — BPAM FFP
Blood Product Expiration Date: 202102182359
Blood Product Expiration Date: 202102222359
Blood Product Expiration Date: 202102222359
ISSUE DATE / TIME: 202102171232
ISSUE DATE / TIME: 202102171232
ISSUE DATE / TIME: 202102171304
Unit Type and Rh: 600
Unit Type and Rh: 6200
Unit Type and Rh: 6200

## 2019-11-30 LAB — ECHO INTRAOPERATIVE TEE
Height: 68 in
Weight: 2856 oz

## 2019-11-30 LAB — GLUCOSE, CAPILLARY
Glucose-Capillary: 101 mg/dL — ABNORMAL HIGH (ref 70–99)
Glucose-Capillary: 106 mg/dL — ABNORMAL HIGH (ref 70–99)
Glucose-Capillary: 107 mg/dL — ABNORMAL HIGH (ref 70–99)
Glucose-Capillary: 111 mg/dL — ABNORMAL HIGH (ref 70–99)
Glucose-Capillary: 111 mg/dL — ABNORMAL HIGH (ref 70–99)
Glucose-Capillary: 113 mg/dL — ABNORMAL HIGH (ref 70–99)
Glucose-Capillary: 114 mg/dL — ABNORMAL HIGH (ref 70–99)
Glucose-Capillary: 121 mg/dL — ABNORMAL HIGH (ref 70–99)
Glucose-Capillary: 122 mg/dL — ABNORMAL HIGH (ref 70–99)
Glucose-Capillary: 123 mg/dL — ABNORMAL HIGH (ref 70–99)
Glucose-Capillary: 141 mg/dL — ABNORMAL HIGH (ref 70–99)
Glucose-Capillary: 148 mg/dL — ABNORMAL HIGH (ref 70–99)
Glucose-Capillary: 156 mg/dL — ABNORMAL HIGH (ref 70–99)
Glucose-Capillary: 160 mg/dL — ABNORMAL HIGH (ref 70–99)
Glucose-Capillary: 92 mg/dL (ref 70–99)
Glucose-Capillary: 98 mg/dL (ref 70–99)

## 2019-11-30 LAB — PREPARE FRESH FROZEN PLASMA
Unit division: 0
Unit division: 0
Unit division: 0

## 2019-11-30 LAB — MAGNESIUM
Magnesium: 2.4 mg/dL (ref 1.7–2.4)
Magnesium: 2.2 mg/dL (ref 1.7–2.4)

## 2019-11-30 MED ORDER — INSULIN ASPART 100 UNIT/ML ~~LOC~~ SOLN
0.0000 [IU] | SUBCUTANEOUS | Status: DC
  Administered 2019-11-30 – 2019-12-04 (×7): 2 [IU] via SUBCUTANEOUS

## 2019-11-30 MED ORDER — CLOPIDOGREL BISULFATE 75 MG PO TABS
75.0000 mg | ORAL_TABLET | Freq: Every day | ORAL | Status: DC
  Administered 2019-12-01 – 2019-12-04 (×4): 75 mg via ORAL
  Filled 2019-11-30 (×4): qty 1

## 2019-11-30 MED ORDER — SODIUM CHLORIDE 0.9% FLUSH
10.0000 mL | Freq: Two times a day (BID) | INTRAVENOUS | Status: DC
  Administered 2019-11-30: 20 mL
  Administered 2019-11-30 – 2019-12-01 (×2): 10 mL

## 2019-11-30 MED ORDER — ORAL CARE MOUTH RINSE
15.0000 mL | Freq: Two times a day (BID) | OROMUCOSAL | Status: DC
  Administered 2019-12-03 (×2): 15 mL via OROMUCOSAL

## 2019-11-30 MED ORDER — SODIUM CHLORIDE 0.9% FLUSH: 10.0000 mL | INTRAVENOUS | Status: DC | PRN

## 2019-11-30 MED ORDER — ASPIRIN 81 MG PO CHEW: 81.0000 mg | CHEWABLE_TABLET | Freq: Every day | ORAL | Status: DC

## 2019-11-30 MED ORDER — COLCHICINE 0.3 MG HALF TABLET
0.3000 mg | ORAL_TABLET | Freq: Two times a day (BID) | ORAL | Status: DC
  Administered 2019-11-30 – 2019-12-04 (×8): 0.3 mg via ORAL
  Filled 2019-11-30 (×10): qty 1

## 2019-11-30 NOTE — Anesthesia Postprocedure Evaluation (Signed)
Anesthesia Post Note  Patient: Miranda [name]  Procedure(s) Performed: CORONARY ARTERY BYPASS GRAFTING (CABG) using the LIMA to LAD; RIMA to PL; Endoscopic harvested right greater saphenous vein: SVC to Diag; SVC to OM1. (N/A Chest) TRANSESOPHAGEAL ECHOCARDIOGRAM (TEE) (N/A ) INDOCYANINE GREEN FLUORESCENCE IMAGING (ICG) (N/A ) Endovein Harvest Of Greater Saphenous Vein (Right Leg Upper)     Patient location during evaluation: ICU Anesthesia Type: General Level of consciousness: awake and alert Pain management: pain level controlled Vital Signs Assessment: post-procedure vital signs reviewed and stable Respiratory status: spontaneous breathing, nonlabored ventilation, respiratory function stable and patient connected to nasal cannula oxygen Cardiovascular status: blood pressure returned to baseline and stable Postop Assessment: no apparent nausea or vomiting Anesthetic complications: no    Last Vitals:  Vitals:   11/30/19 1300 11/30/19 1400  BP: 137/75 132/80  Pulse: 81 82  Resp: (!) 21 (!) 24  Temp:    SpO2: 100% 99%    Last Pain:  Vitals:   11/30/19 1200  TempSrc:   PainSc: 0-No pain                 Tiajuana Amass

## 2019-11-30 NOTE — Telephone Encounter (Signed)
New Message   Patient has a TOC with Vin Bhagat 3/1

## 2019-11-30 NOTE — Discharge Summary (Signed)
Physician Discharge Summary  Patient ID: Miranda [name] MRN: [id number] DOB/AGE: 1951-09-19 69 y.o.  Admit date: 11/25/2019 Discharge date: 12/04/2019  Admission Diagnoses: Hypertension Acute non-ST elevation myocardial infarction Acute pulmonary embolus Multifocal pneumonia Hyperlipidemia History of COPD History of gout    Discharge Diagnoses:  Multi-vessel coronary artery disease S/P CABG x 4 Acute non-ST elevation myocardial infarction Hypertension Acute pulmonary embolus Multifocal pneumonia Hyperlipidemia History of COPD History of gout    Discharged Condition: stable   History of Present Illness:      Miranda [name] is a 69 yo AA female with known history of HTN, Hyperlipidemia, COPD and gout.  She presented to the ED with complaints of chest pain.  The patients symptoms developed while she was walking home from work.  The pain is located on her left side, it feels like pressure and is accompanied by shortness of breath.  She sat down and rested and her symptoms initially improved.  However, when she started walking again her symptoms started again and were more severe with associated nausea and diaphoresis.  The patient admitted these episodes had been occurring for months.  Workup in the ED showed no acute ischemic changes on EKG.  Her initial troponin level was elevated at 78. CTA of the chest was positive for acute PE in the RLL pulmonary artery and evidence of multifocal pneumonia.  There was also evidence of multivessel CAD and she does have a positive family history.  She was admitted to the medicine service started on heparin and antibiotics for further workup.  Cardiology consult was obtained and she ruled in for NSTEMI with further increase in her Troponin level.  She developed chest pain while getting up to use the bathroom.  Echocardiogram was obtained and showed no evidence of RV Strain.  She underwent cardiac catheterization by Dr. Martinique.  She was found to have  critical 3V CAD and it was felt coronary bypass grafting would be indicated.  Currently the patient is having chest pain after she eats or when she gets up and moves around the room.  She remains on a heparin drip.  She has been treated with IV Morphine which provides relief, but pain keeps recurring.  She was started on NTG paste while I was in the room with the patient.    Hospital Course:  She was evaluated by Dr. Julien Girt and was felt to be an appropriate candidate for operative coronary revascularization.  The procedure was discussed with her in detail and she decided to proceed with surgery.  She was taken to the operating room on 11/29/2019 where CABG x4 was accomplished using bilateral internal mammary artery graft as well as endoscopically harvested saphenous vein grafts.  She tolerated procedure well.  She separated from cardiopulmonary bypass without any difficulty.  She was transferred to the cardiovascular ICU where she remained hemodynamically stable.  She was weaned from the ventilator and extubated on the evening of surgery.  Vasopressor support was weaned on the first postoperative day.  She was mobilized routinely with the assistance of physical therapy.  After chest tube drainage had sufficiently subsided, the chest tubes were removed.  Diet and activity were advanced and well-tolerated.  She was transferred to progressive care.  Cardiac rhythm was monitored carefully.  By 12/03/19, she was completely independent with her mobility and was tolerating a cardiac diet without difficulty.  She was having appropriate bowel bladder function.  She was felt to be ready for discharge home on post-op day 5.  Consults: cardiology  Significant Diagnostic Studies:   LEFT HEART CATH AND CORONARY ANGIOGRAPHY  Conclusion    Mid LM lesion is 60% stenosed.  Ost LAD to Prox LAD lesion is 95% stenosed.  Prox LAD to Mid LAD lesion is 80% stenosed.  Ost Cx to Prox Cx lesion is 95% stenosed.  Mid  Cx lesion is 99% stenosed.  Prox RCA to Mid RCA lesion is 80% stenosed.  LV end diastolic pressure is normal.   1. Critical 3 vessel obstructive CAD 2. Normal LVEDP  Plan: reviewed with Dr Angelena Form. Plan CT surgery consult. Patient is currently without chest pain and hemodynamically stable. Will resume heparin post cath.      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 Murvin Natal, MD  Assistant:       Macarthur Critchley, PA-C  Anesthesia:    get  Operative Findings: ? Preserved left ventricular systolic function ? Good quality internal mammary artery conduits ? Good quality saphenous vein conduit ? Fair quality target vessels for grafting    BRIEF CLINICAL NOTE AND INDICATIONS FOR SURGERY  69 yo lady with h/o PE presented earlier this week with sudden onset CP which was quite new and different than any pain ever experienced. Work-up showed NSTEMI followed by LHC, which showed severe multivessel CAD including 95% LAD occlusion, near-total occlusion of LCx and 80% RCA. She is referred to surgery for CABG. Having been deemed an appropriate candidate for surgery, she is taken to the OR for CABG.   Discharge Exam: Blood pressure 117/70, pulse 72, temperature 98.3 F (36.8 C), temperature  source Oral, resp. rate 20, height 5\' 8"  (1.727 m), weight 79.2 kg, SpO2 96 %.   Physical Exam: General appearance:alert, cooperative and mild distress Neurologic:intact Heart:SR at 70-80. Lungs:clear to auscultation bilaterally Abdomen:Soft and non-tender Extremities:Warm and well perfused. Minimal peripheral edema. Wound:The sternal incision and EVH incisions are well approximated and dry.   Disposition:    Allergies as of 12/04/2019   No Known Allergies     Medication List    STOP taking these medications   Alka-Seltzer Plus Sinus 7.8-325 MG Tbef Generic drug: Phenylephrine-Aspirin   amLODipine 2.5 MG tablet Commonly known as: NORVASC   aspirin EC 81 MG tablet   indomethacin 50 MG capsule Commonly known as: INDOCIN   telmisartan-hydrochlorothiazide 40-12.5 MG tablet Commonly known as: MICARDIS HCT   THERAFLU FLU/COLD PO     TAKE these medications   albuterol 108 (90 Base) MCG/ACT inhaler Commonly known as: VENTOLIN HFA Inhale 2 puffs into the lungs every 4 (four) hours as needed for wheezing or shortness of breath.   apixaban 5 MG Tabs tablet Commonly known as: ELIQUIS Take 1 tablet (5 mg total) by mouth 2 (two) times daily.  ascorbic acid 500 MG tablet Commonly known as: VITAMIN C Take 500 mg by mouth daily.   clopidogrel 75 MG tablet Commonly known as: PLAVIX Take 1 tablet (75 mg total) by mouth daily.   colchicine 0.6 MG tablet Take 0.5 tablets (0.3 mg total) by mouth 2 (two) times daily. What changed:   how much to take  how to take this  when to take this  additional instructions   Fluticasone-Salmeterol 100-50 MCG/DOSE Aepb Commonly known as: ADVAIR Inhale 1 puff into the lungs daily as needed (SOB).   metoprolol tartrate 25 MG tablet Commonly known as: LOPRESSOR Take 1 tablet (25 mg total) by mouth 2 (two) times daily.   rosuvastatin 20 MG tablet Commonly known as: CRESTOR Take 20 mg by mouth daily.    traMADol 50 MG tablet Commonly known as: ULTRAM Take 1 tablet (50 mg total) by mouth every 4 (four) hours as needed for up to 7 days for moderate pain.   vitamin E 180 MG (400 UNITS) capsule Generic drug: vitamin E Take 400 Units by mouth daily.      Follow-up Information    [place], Crista Luria, [place] Follow up on 12/11/2019.   Specialty: Cardiology Why: Please arrive 15 minutes early for your 11:15am post-hospital cardiology appointment. Contact information: [address] [place] [address] [phone]        Wonda Olds, MD. Go on 11/30/2019.   Specialty: Cardiothoracic Surgery Why: You  have a follow up appointment with Dr. Orvan Seen on Monday, 12/11/19 at 1:30pm.  Please arrive 30 minutes early for a chest X-ray to be performed by [facility] located on the first floor of the same building. Contact information: [address] [place] [address] [phone]        Boyce Medici, FNP. Schedule an appointment as soon as possible for a visit.   Specialty: Nurse Practitioner Why: Please contact you primary care provider this week for follow up instructions and managemane of suspected pre-diabetes.  Contact information: [address] [place] [address] [phone]           Signed: Antony Odea, PA-C 12/04/2019, 8:14 AM

## 2019-11-30 NOTE — Progress Notes (Addendum)
TCTS DAILY ICU PROGRESS NOTE                   [address]            [place] [address]          [phone]   1 Day Post-Op Procedure(s) (LRB): CORONARY ARTERY BYPASS GRAFTING (CABG) using the LIMA to LAD; RIMA to PL; Endoscopic harvested right greater saphenous vein: SVC to Diag; SVC to OM1. (N/A) TRANSESOPHAGEAL ECHOCARDIOGRAM (TEE) (N/A) INDOCYANINE GREEN FLUORESCENCE IMAGING (ICG) (N/A) Endovein Harvest Of Greater Saphenous Vein (Right)  Total Length of Stay:  LOS: 4 days   Subjective: Extubated ~9:30 last evening.  Awake and alert, says pain control is adequate.   Objective: Vital signs in last 24 hours: Temp:  [96.4 F (35.8 C)-98.8 F (37.1 C)] 98.6 F (37 C) (02/18 0915) Pulse Rate:  [59-95] 81 (02/18 1000) Cardiac Rhythm: Atrial paced (02/18 0800) Resp:  [12-32] 22 (02/18 1000) BP: (83-142)/(49-91) 119/68 (02/18 1000) SpO2:  [98 %-100 %] 100 % (02/18 1000) Arterial Line BP: (97-189)/(35-82) 149/58 (02/18 1000) FiO2 (%):  [40 %-50 %] 40 % (02/17 2045) Weight:  [83.1 kg] 83.1 kg (02/18 0615)  Filed Weights   11/26/19 0005 11/29/19 0502 11/30/19 0615  Weight: 81.6 kg 81 kg 83.1 kg    Weight change: 2.133 kg   Hemodynamic parameters for last 24 hours: PAP: (19-35)/(8-24) 25/10 CO:  [4.2 L/min-7.3 L/min] 5.7 L/min CI:  [2.2 L/min/m2-3.7 L/min/m2] 2.9 L/min/m2  Intake/Output from previous day: 02/17 0701 - 02/18 0700 In: 5086.4 [P.O.:120; I.V.:2909; Blood:833; IV Piggyback:1224.4] Out: 4805 [Urine:3955; Blood:400; Chest Tube:450]  Intake/Output this shift: Total I/O In: 60.8 [I.V.:60.8] Out: 115 [Urine:75; Chest Tube:40]  Current Meds: Scheduled Meds: . sodium chloride   Intravenous Once  . acetaminophen  1,000 mg Oral Q6H   Or  . acetaminophen (TYLENOL) oral liquid 160 mg/5 mL  1,000 mg Per Tube Q6H  . aspirin EC  325 mg Oral Daily   Or  . aspirin  324 mg Per Tube Daily  . atorvastatin  80 mg Oral q1800  . bisacodyl  10 mg  Oral Daily   Or  . bisacodyl  10 mg Rectal Daily  . Chlorhexidine Gluconate Cloth  6 each Topical Daily  . docusate sodium  200 mg Oral Daily  . doxycycline  100 mg Oral Q12H  . metoprolol tartrate  12.5 mg Oral BID   Or  . metoprolol tartrate  12.5 mg Per Tube BID  . [START ON 12/01/2019] pantoprazole  40 mg Oral Daily  . sodium chloride flush  10-40 mL Intracatheter Q12H  . sodium chloride flush  3 mL Intravenous Q12H   Continuous Infusions: . sodium chloride Stopped (11/29/19 1852)  . sodium chloride    . sodium chloride 20 mL/hr at 11/29/19 1450  . albumin human 12.5 g (11/29/19 1645)  . cefUROXime (ZINACEF)  IV Stopped (11/30/19 0533)  . dexmedetomidine (PRECEDEX) IV infusion Stopped (11/30/19 0539)  . insulin 0.2 mL/hr at 11/30/19 0900  . lactated ringers    . lactated ringers    . lactated ringers 20 mL/hr at 11/29/19 1450  . niCARDipine 7.5 mg/hr (11/29/19 2114)  . nitroGLYCERIN Stopped (11/29/19 1741)  . norepinephrine (LEVOPHED) Adult infusion 0 mcg/min (11/29/19 1450)   PRN Meds:.sodium chloride, albumin human, dextrose, lactated ringers, metoprolol tartrate, midazolam, morphine injection, ondansetron (ZOFRAN) IV, oxyCODONE, sodium chloride flush, sodium chloride flush, traMADol  General appearance: alert, cooperative and mild distress  Neurologic: intact Heart: a-paced. CI 2.9 prior to removal of PA catheter. On no pressors or inotropes.  Lungs: clear to auscultation bilaterally Abdomen: Soft and non-tender Extremities: Warm and well perfused, palpable DP pulses bilat. Minimal peripheral edema.   Wound: The sternal incision is covered with a dry Aquacel dressing. EVH site OK.   Lab Results: CBC: Recent Labs    11/29/19 2100 11/29/19 2100 11/29/19 2109 11/30/19 0401  WBC 14.6*  --   --  11.8*  HGB 11.6*   < > 11.6* 10.9*  HCT 35.0*   < > 34.0* 32.6*  PLT 242  --   --  243   < > = values in this interval not displayed.   BMET:  Recent Labs     11/29/19 2100 11/29/19 2100 11/29/19 2109 11/30/19 0401  NA 138   < > 141 137  K 4.4   < > 4.4 4.3  CL 109  --   --  107  CO2 21*  --   --  23  GLUCOSE 190*  --   --  122*  BUN 10  --   --  8  CREATININE 0.74  --   --  0.69  CALCIUM 8.1*  --   --  8.2*   < > = values in this interval not displayed.    CMET: Lab Results  Component Value Date   WBC 11.8 (H) 11/30/2019   HGB 10.9 (L) 11/30/2019   HCT 32.6 (L) 11/30/2019   PLT 243 11/30/2019   GLUCOSE 122 (H) 11/30/2019   CHOL 165 11/26/2019   TRIG 59 11/26/2019   HDL 39 (L) 11/26/2019   LDLCALC 114 (H) 11/26/2019   ALT 34 11/28/2019   AST 34 11/28/2019   NA 137 11/30/2019   K 4.3 11/30/2019   CL 107 11/30/2019   CREATININE 0.69 11/30/2019   BUN 8 11/30/2019   CO2 23 11/30/2019   TSH 1.62 03/12/2016   INR 1.5 (H) 11/29/2019   HGBA1C 6.9 (H) 11/28/2019      PT/INR:  Recent Labs    11/29/19 1506  LABPROT 17.9*  INR 1.5*   Radiology: [facility] Chest Port 1 View  Result Date: 11/30/2019 CLINICAL DATA:  Postop. EXAM: PORTABLE CHEST 1 VIEW COMPARISON:  Radiograph yesterday. CT 11/26/2019 FINDINGS: Interval extubation and removal of enteric tube. Minimally lower lung volumes from prior. Right internal jugular Swan-Ganz catheter tip the region of the proximal pulmonary outflow tract. Additional catheter from an inferior approach projecting over the right main pulmonary outflow tract is likely a mediastinal drain. Bilateral chest tubes in place. Trace left apical pneumothorax. No visualized right pneumothorax. Post CABG with cardiomegaly. Retrocardiac opacity likely combination of pleural effusion and atelectasis. Bilateral suprahilar scarring. No pulmonary edema. IMPRESSION: 1. Trace left apical pneumothorax with left chest tube in place. 2. Interval extubation with slightly lower lung volumes. Bilateral chest tubes and Swan-Ganz catheter remains in place. 3. Retrocardiac opacity likely combination of pleural fluid and atelectasis.  Electronically Signed   By: Keith Rake M.D.   On: 11/30/2019 05:33   DG Chest Port 1 View  Result Date: 11/29/2019 CLINICAL DATA:  69 year old female status post open heart surgery. EXAM: PORTABLE CHEST 1 VIEW COMPARISON:  Chest radiograph and CT dated 11/26/2019. FINDINGS: Endotracheal tube approximately 2 cm above the carina. Enteric tube extends below the diaphragm with side-port just distal to the GE junction and tip beyond the inferior margin of the image. Right IJ Swan-Ganz catheter with tip over  the mediastinum, likely in the region of bifurcation of the main pulmonary trunk. Bilateral chest tubes and and inferiorly accessed mediastinal drain or an additional chest tube. Bilateral upper lobe, perihilar, and subpleural hazy and streaky densities may represent atelectasis or pneumonia. Small right pleural effusion may be present. No pneumothorax. Stable cardiac silhouette. Bilateral hilar prominence may represent a degree of pulmonary hypertension. Median sternotomy wires and postsurgical changes of CABG. No other acute osseous pathology. IMPRESSION: 1. Status post open heart surgery with support apparatus as above. No pneumothorax. 2. Bilateral streaky densities may represent atelectasis or infiltrate. Electronically Signed   By: Anner Crete M.D.   On: 11/29/2019 15:37     Assessment/Plan: S/P Procedure(s) (LRB): CORONARY ARTERY BYPASS GRAFTING (CABG) using the LIMA to LAD; RIMA to PL; Endoscopic harvested right greater saphenous vein: SVC to Diag; SVC to OM1. 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  Hemodynamically stable on no pressor or inotropic support. PA catheter removed, currently a-paced. Moderate CT drainage but this is tapering off. Leave tubes for now. Mobilize,  resume ASA, low-dose B-blocker.   -Acute PE RLL pulmonary artery on admission. Resume anticoagulation when beyond risk of acute surgical bleed.   -Pulm- Oxygenating well on [place] O2.  Multi-focal pneumonia noted on admission CTA. To resume oral doxycycline, encourage pulmonary hygiene.   -Hyperlipidemia- statin resumed  -DVT PPX-continue SCD's for now.   -Endo-No prior h/o DM but admission HA1C was 6.9.  Glucose 107-121 since surgery. Continue to monitor, SSI as needed.     Antony Odea, PA-C [phone] 11/30/2019 10:29 AM Pt seen and  Examined; agree with PA [place] note. Doing well this evening with stable hemodynamics, neurologically intact. Rica Heather Z. Orvan Seen, [place]

## 2019-11-30 NOTE — Telephone Encounter (Signed)
**Note De-Identified Staci Dack Obfuscation** The pt has not been discharged from the hospital yet. We will monitor her chart and call once she has been.

## 2019-11-30 NOTE — Progress Notes (Signed)
Inpatient Diabetes Program Recommendations  AACE/ADA: New Consensus Statement on Inpatient Glycemic Control (2015)  Target Ranges:  Prepandial:   less than 140 mg/dL      Peak postprandial:   less than 180 mg/dL (1-2 hours)      Critically ill patients:  140 - 180 mg/dL   Lab Results  Component Value Date   GLUCAP 106 (H) 11/30/2019   HGBA1C 6.9 (H) 11/28/2019    Review of Glycemic Control Results for Miranda [name], Miranda [name] (MRN [id number]) as of 11/30/2019 12:40  Ref. Range 11/30/2019 06:53 11/30/2019 08:12 11/30/2019 09:28 11/30/2019 11:01  Glucose-Capillary Latest Ref Range: 70 - 99 mg/dL 121 (H) 111 (H) 107 (H) 106 (H)   Diabetes history: New onset DM? Outpatient Diabetes medications: none Current orders for Inpatient glycemic control: IV insulin  Inpatient Diabetes Program Recommendations:    Per ADA guidelines, parameters for DM diagnosis exceeds 6.5%. Given result was 6.9% this meet criteria for diagnosis.  Once diagnosis noted in progress note, will plan to begin education.  Confirm diabetes diagnosis?   Thanks, Bronson Curb, MSN, RNC-OB Diabetes Coordinator [phone] (8a-5p)

## 2019-12-01 ENCOUNTER — Inpatient Hospital Stay (HOSPITAL_COMMUNITY): Payer: Medicare PPO

## 2019-12-01 LAB — BPAM RBC
Blood Product Expiration Date: 202103162359
Blood Product Expiration Date: 202103162359
Blood Product Expiration Date: 202103192359
Blood Product Expiration Date: 202103192359
ISSUE DATE / TIME: 202102160743
ISSUE DATE / TIME: 202102160743
ISSUE DATE / TIME: 202102171158
ISSUE DATE / TIME: 202102171158
Unit Type and Rh: 6200
Unit Type and Rh: 6200
Unit Type and Rh: 6200
Unit Type and Rh: 6200

## 2019-12-01 LAB — CBC
HCT: 33.7 % — ABNORMAL LOW (ref 36.0–46.0)
Hemoglobin: 11.2 g/dL — ABNORMAL LOW (ref 12.0–15.0)
MCH: 30.8 pg (ref 26.0–34.0)
MCHC: 33.2 g/dL (ref 30.0–36.0)
MCV: 92.6 fL (ref 80.0–100.0)
Platelets: 264 10*3/uL (ref 150–400)
RBC: 3.64 MIL/uL — ABNORMAL LOW (ref 3.87–5.11)
RDW: 14.9 % (ref 11.5–15.5)
WBC: 14.1 10*3/uL — ABNORMAL HIGH (ref 4.0–10.5)
nRBC: 0 % (ref 0.0–0.2)

## 2019-12-01 LAB — TYPE AND SCREEN
ABO/RH(D): A POS
Antibody Screen: NEGATIVE
Unit division: 0
Unit division: 0
Unit division: 0
Unit division: 0

## 2019-12-01 LAB — BASIC METABOLIC PANEL
Anion gap: 7 (ref 5–15)
BUN: 11 mg/dL (ref 8–23)
CO2: 25 mmol/L (ref 22–32)
Calcium: 8.7 mg/dL — ABNORMAL LOW (ref 8.9–10.3)
Chloride: 104 mmol/L (ref 98–111)
Creatinine, Ser: 0.88 mg/dL (ref 0.44–1.00)
GFR calc Af Amer: >60 mL/min (ref >60)
GFR calc non Af Amer: >60 mL/min (ref >60)
Glucose, Bld: 96 mg/dL (ref 70–99)
Potassium: 4 mmol/L (ref 3.5–5.1)
Sodium: 136 mmol/L (ref 135–145)

## 2019-12-01 LAB — GLUCOSE, CAPILLARY
Glucose-Capillary: 102 mg/dL — ABNORMAL HIGH (ref 70–99)
Glucose-Capillary: 106 mg/dL — ABNORMAL HIGH (ref 70–99)
Glucose-Capillary: 123 mg/dL — ABNORMAL HIGH (ref 70–99)
Glucose-Capillary: 74 mg/dL (ref 70–99)
Glucose-Capillary: 95 mg/dL (ref 70–99)

## 2019-12-01 MED ORDER — AMLODIPINE BESYLATE 2.5 MG PO TABS
2.5000 mg | ORAL_TABLET | Freq: Every day | ORAL | Status: DC
  Administered 2019-12-01: 2.5 mg via ORAL
  Filled 2019-12-01: qty 1

## 2019-12-01 MED ORDER — KETOROLAC TROMETHAMINE 15 MG/ML IJ SOLN
7.5000 mg | Freq: Four times a day (QID) | INTRAMUSCULAR | Status: AC
  Administered 2019-12-01 – 2019-12-02 (×5): 7.5 mg via INTRAVENOUS
  Filled 2019-12-01 (×5): qty 1

## 2019-12-01 MED ORDER — FUROSEMIDE 10 MG/ML IJ SOLN
40.0000 mg | Freq: Once | INTRAMUSCULAR | Status: AC
  Administered 2019-12-01: 40 mg via INTRAVENOUS
  Filled 2019-12-01: qty 4

## 2019-12-01 MED ORDER — BISACODYL 5 MG PO TBEC: 5.0000 mg | DELAYED_RELEASE_TABLET | Freq: Every day | ORAL | Status: DC | PRN

## 2019-12-01 MED ORDER — APIXABAN 5 MG PO TABS
5.0000 mg | ORAL_TABLET | Freq: Two times a day (BID) | ORAL | Status: DC
  Administered 2019-12-01 – 2019-12-04 (×7): 5 mg via ORAL
  Filled 2019-12-01 (×7): qty 1

## 2019-12-01 MED FILL — Heparin Sodium (Porcine) Inj 1000 Unit/ML: INTRAMUSCULAR | Qty: 30 | Status: AC

## 2019-12-01 MED FILL — Potassium Chloride Inj 2 mEq/ML: INTRAVENOUS | Qty: 40 | Status: AC

## 2019-12-01 MED FILL — Magnesium Sulfate Inj 50%: INTRAMUSCULAR | Qty: 10 | Status: AC

## 2019-12-01 NOTE — Progress Notes (Signed)
ANTICOAGULATION CONSULT NOTE - Initial Consult  Pharmacy Consult for apixaban Indication: pulmonary embolus  No Known Allergies  Patient Measurements: Height: 5\' 8"  (172.7 cm) Weight: 184 lb 11.9 oz (83.8 kg) IBW/kg (Calculated) : 63.9   Vital Signs: Temp: 98.6 F (37 C) (02/19 0811) Temp Source: Oral (02/19 0811) BP: 82/65 (02/19 0800) Pulse Rate: 70 (02/19 0800)  Labs: Recent Labs    11/29/19 0521 11/29/19 0940 11/29/19 1506 11/29/19 1510 11/30/19 0401 11/30/19 0401 11/30/19 1635 12/01/19 0500  HGB 11.1*   < > 7.5*   < > 10.9*   < > 11.4* 11.2*  HCT 34.6*   < > 23.2*   < > 32.6*  --  34.8* 33.7*  PLT 516*   < > 205   < > 243  --  266 264  APTT  --   --  33  --   --   --   --   --   LABPROT  --   --  17.9*  --   --   --   --   --   INR  --   --  1.5*  --   --   --   --   --   HEPARINUNFRC 0.71*  --   --   --   --   --   --   --   CREATININE 0.87   < >  --    < > 0.69  --  0.78 0.88   < > = values in this interval not displayed.    Estimated Creatinine Clearance: 69.4 mL/min (by C-G formula based on SCr of 0.88 mg/dL).   Medical History: Past Medical History:  Diagnosis Date  . COPD (chronic obstructive pulmonary disease) ([place])   . HTN (hypertension)   . Hypertension   . Pneumonia 11/2019    Medications:  Medications Prior to Admission  Medication Sig Dispense Refill Last Dose  . albuterol (VENTOLIN HFA) 108 (90 Base) MCG/ACT inhaler Inhale 2 puffs into the lungs every 4 (four) hours as needed for wheezing or shortness of breath.    11/25/2019 at Unknown time  . amLODipine (NORVASC) 2.5 MG tablet Take 2.5 mg by mouth daily. Pt may take additional dose as needed for HBP   11/25/2019 at Unknown time  . ascorbic acid (VITAMIN C) 500 MG tablet Take 500 mg by mouth daily.   11/25/2019 at Unknown time  . aspirin EC 81 MG tablet Take 81 mg by mouth every other day.   11/25/2019 at Unknown time  . Chlorphen-Pseudoephed-APAP (THERAFLU FLU/COLD PO) Take 1 Package by  mouth daily as needed (cold symptoms).   Past Week at Unknown time  . colchicine 0.6 MG tablet Take 2 tablets now, then 1 in an hour. Wait 3 days to take this again. 30 tablet 5 11/25/2019 at Unknown time  . Fluticasone-Salmeterol (ADVAIR) 100-50 MCG/DOSE AEPB Inhale 1 puff into the lungs daily as needed (SOB).    Past Month at Unknown time  . indomethacin (INDOCIN) 50 MG capsule Take 1 capsule (50 mg total) by mouth 3 (three) times daily with meals. (Patient taking differently: Take 50 mg by mouth 3 (three) times daily as needed for mild pain or moderate pain. ) 30 capsule 6 Past Month at Unknown time  . Phenylephrine-Aspirin (ALKA-SELTZER PLUS SINUS) 7.8-325 MG TBEF Take 2 tablets by mouth daily as needed (cold symptoms).   Past Week at Unknown time  . rosuvastatin (CRESTOR) 20 MG tablet Take 20  mg by mouth daily.    11/25/2019 at Unknown time  . telmisartan-hydrochlorothiazide (MICARDIS HCT) 40-12.5 MG tablet Take 1 tablet by mouth daily. Return in 12/17 for refills. 90 tablet 1 11/25/2019 at Unknown time  . vitamin E (VITAMIN E) 180 MG (400 UNITS) capsule Take 400 Units by mouth daily.   11/25/2019 at Unknown time   Scheduled:  . sodium chloride   Intravenous Once  . acetaminophen  1,000 mg Oral Q6H   Or  . acetaminophen (TYLENOL) oral liquid 160 mg/5 mL  1,000 mg Per Tube Q6H  . amLODipine  2.5 mg Oral Daily  . aspirin  81 mg Oral Daily  . atorvastatin  80 mg Oral q1800  . bisacodyl  10 mg Oral Daily   Or  . bisacodyl  10 mg Rectal Daily  . Chlorhexidine Gluconate Cloth  6 each Topical Daily  . clopidogrel  75 mg Oral Daily  . colchicine  0.3 mg Oral BID  . docusate sodium  200 mg Oral Daily  . doxycycline  100 mg Oral Q12H  . insulin aspart  0-24 Units Subcutaneous Q4H  . ketorolac  7.5 mg Intravenous Q6H  . mouth rinse  15 mL Mouth Rinse BID  . metoprolol tartrate  12.5 mg Oral BID   Or  . metoprolol tartrate  12.5 mg Per Tube BID  . pantoprazole  40 mg Oral Daily  . sodium  chloride flush  10-40 mL Intracatheter Q12H  . sodium chloride flush  3 mL Intravenous Q12H    Assessment: 69 yo female with CT showing PE on 11/26/19. She is s/p CABG on 2/17 and pharmacy consulted to begin [place]. She is also noted on plavix and aspirin -Hg= 11.2, plt= 264 -SCr= 0.88  Spoke with Dr. Orvan Seen: with recent surgery and bleeding risk, will not use the apixaban 10mg  loading regimen. Also to discontinue aspirin   Goal of Therapy:  Monitor platelets by anticoagulation protocol: Yes   Plan:  -Apixaban 5mg  po bid -Discontinue aspirin  Hildred Laser, PharmD Clinical Pharmacist **Pharmacist phone directory can now be found on [email or web address] ([email or web address]).  Listed under [place].

## 2019-12-01 NOTE — Progress Notes (Signed)
Came to ambulate as pt just got to bed after transfer. Pt exhausted and declined. Also declined using IS. Sts she just needs rest for now and will walk later. Encouraged her to walk with staff. Yves Dill CES, ACSM 2:02 PM 12/01/2019

## 2019-12-01 NOTE — Progress Notes (Addendum)
TCTS DAILY ICU PROGRESS NOTE                   [address]            [place] [address]          [phone]   2 Days Post-Op Procedure(s) (LRB): CORONARY ARTERY BYPASS GRAFTING (CABG) using the LIMA to LAD; RIMA to PL; Endoscopic harvested right greater saphenous vein: SVC to Diag; SVC to OM1. (N/A) TRANSESOPHAGEAL ECHOCARDIOGRAM (TEE) (N/A) INDOCYANINE GREEN FLUORESCENCE IMAGING (ICG) (N/A) Endovein Harvest Of Greater Saphenous Vein (Right)  Total Length of Stay:  LOS: 5 days   Subjective: Up in the bedside chair,Feels OK, Pain control not as good today. Had brief, transient tachycardia while she walked in the hall this morning.   Objective: Vital signs in last 24 hours: Temp:  [98.5 F (36.9 C)-98.8 F (37.1 C)] 98.7 F (37.1 C) (02/19 0300) Pulse Rate:  [71-83] 82 (02/19 0700) Cardiac Rhythm: Atrial paced (02/19 0400) Resp:  [14-31] 25 (02/19 0700) BP: (114-149)/(68-98) 128/71 (02/19 0700) SpO2:  [96 %-100 %] 100 % (02/19 0700) Arterial Line BP: (143-167)/(56-64) 167/59 (02/18 1400) Weight:  [83.8 kg] 83.8 kg (02/19 0500)  Filed Weights   11/29/19 0502 11/30/19 0615 12/01/19 0500  Weight: 81 kg 83.1 kg 83.8 kg    Weight change: 0.7 kg   Hemodynamic parameters for last 24 hours: PAP: (22-30)/(8-15) 25/10  Intake/Output from previous day: 02/18 0701 - 02/19 0700 In: 521.8 [I.V.:321.8; IV Piggyback:200] Out: 1080 [Urine:660; Chest Tube:420]  Intake/Output this shift: No intake/output data recorded.  Current Meds: Scheduled Meds: . sodium chloride   Intravenous Once  . acetaminophen  1,000 mg Oral Q6H   Or  . acetaminophen (TYLENOL) oral liquid 160 mg/5 mL  1,000 mg Per Tube Q6H  . amLODipine  2.5 mg Oral Daily  . aspirin  81 mg Oral Daily  . atorvastatin  80 mg Oral q1800  . bisacodyl  10 mg Oral Daily   Or  . bisacodyl  10 mg Rectal Daily  . Chlorhexidine Gluconate Cloth  6 each Topical Daily  . clopidogrel  75 mg Oral Daily  .  colchicine  0.3 mg Oral BID  . docusate sodium  200 mg Oral Daily  . doxycycline  100 mg Oral Q12H  . insulin aspart  0-24 Units Subcutaneous Q4H  . mouth rinse  15 mL Mouth Rinse BID  . metoprolol tartrate  12.5 mg Oral BID   Or  . metoprolol tartrate  12.5 mg Per Tube BID  . pantoprazole  40 mg Oral Daily  . sodium chloride flush  10-40 mL Intracatheter Q12H  . sodium chloride flush  3 mL Intravenous Q12H   Continuous Infusions: . sodium chloride Stopped (11/29/19 1852)  . sodium chloride    . sodium chloride 20 mL/hr at 11/29/19 1450  . lactated ringers    . lactated ringers    . lactated ringers 10 mL/hr at 12/01/19 0700   PRN Meds:.sodium chloride, bisacodyl, lactated ringers, metoprolol tartrate, ondansetron (ZOFRAN) IV, oxyCODONE, sodium chloride flush, sodium chloride flush, traMADol  Physical Exam: General appearance: alert, cooperative and mild distress Neurologic: intact Heart: Pacer turned off, intrinsic rhythm is SR in 70's. Lungs: clear to auscultation bilaterally Abdomen: Soft and non-tender Extremities: Warm and well perfused, palpable DP pulses bilat. Minimal peripheral edema.            Wound: The sternal incision is covered with a dry Aquacel dressing.  EVH site OK.   Lab Results: CBC: Recent Labs    11/30/19 1635 12/01/19 0500  WBC 14.4* 14.1*  HGB 11.4* 11.2*  HCT 34.8* 33.7*  PLT 266 264   BMET:  Recent Labs    11/30/19 1635 12/01/19 0500  NA 136 136  K 4.0 4.0  CL 104 104  CO2 22 25  GLUCOSE 100* 96  BUN 11 11  CREATININE 0.78 0.88  CALCIUM 8.7* 8.7*    CMET: Lab Results  Component Value Date   WBC 14.1 (H) 12/01/2019   HGB 11.2 (L) 12/01/2019   HCT 33.7 (L) 12/01/2019   PLT 264 12/01/2019   GLUCOSE 96 12/01/2019   CHOL 165 11/26/2019   TRIG 59 11/26/2019   HDL 39 (L) 11/26/2019   LDLCALC 114 (H) 11/26/2019   ALT 34 11/28/2019   AST 34 11/28/2019   NA 136 12/01/2019   K 4.0 12/01/2019   CL 104 12/01/2019   CREATININE 0.88  12/01/2019   BUN 11 12/01/2019   CO2 25 12/01/2019   TSH 1.62 03/12/2016   INR 1.5 (H) 11/29/2019   HGBA1C 6.9 (H) 11/28/2019      PT/INR:  Recent Labs    11/29/19 1506  LABPROT 17.9*  INR 1.5*   Radiology:   CHEST  1 VIEW  COMPARISON:  11/30/2019.  FINDINGS: Interim removal of Swan-Ganz catheter. Right IJ sheath in stable position. Swan-Ganz catheter stable position. Mediastinal drainage catheter stable position. Bilateral chest tubes in stable position. Tiny residual left apical pneumothorax, improved from prior exam. Prior CABG. Stable cardiomegaly. No pulmonary venous congestion. Left base atelectasis/infiltrate noted on today's exam. Mild subsegmental atelectasis both upper lungs again noted. Tiny bilateral pleural effusions cannot be excluded.  IMPRESSION: 1. Interim removal Swan-Ganz catheter. Right IJ sheath, Swan-Ganz catheter, mediastinal drainage catheter stable position. Bilateral chest tubes in stable position. Tiny residual left apical pneumothorax, improved from prior exam.  2. Prior CABG. Stable cardiomegaly. No pulmonary venous congestion.  3. Left base atelectasis/infiltrate noted on today's exam. Mild subsegmental atelectasis both upper lungs again noted. Tiny bilateral pleural effusions cannot be excluded.   Assessment/Plan: S/P Procedure(s) (LRB): CORONARY ARTERY BYPASS GRAFTING (CABG) using the LIMA to LAD; RIMA to PL; Endoscopic harvested right greater saphenous vein: SVC to Diag; SVC to OM1. 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  Hemodynamically stable on no pressor or inotropic support.Pacer off ->NSR.  Moderate CT drainage, will leave tubes for now. Mobilize, begin Plavix for NSTEMI. Stop ASA since we are starting  Eliquis. Continue low-dose B-blocker and resume her Norvasc.  Advance activity.  -Acute PE RLL pulmonary artery on admission. Begin Eliquis today.  -Pulm- Oxygenating well on [place] O2.  Multi-focal pneumonia noted on admission CTA.Resumed oral doxycycline, encourage pulmonary hygiene.   -Hyperlipidemia- statin resumed  -DVT PPX-beginning Eliquis  -Expected acute blood loss anemia- mild, continue to  monitor.  -Endo-No prior h/o DM but had two HA1C tests prior to surger on 2/16 (6.9) and on 2/14 (<4.2). Glucose <120 most of the time since surgery. Continue to monitor, SSI as needed.   Antony Odea, PA-C [phone] 12/01/2019 7:53 AM

## 2019-12-01 NOTE — Discharge Instructions (Signed)
Discharge Instructions:  1. You may shower, please wash incisions daily with soap and water and keep dry.  If you wish to cover wounds with dressing you may do so but please keep clean and change daily.  No tub baths or swimming until incisions have completely healed.  If your incisions become red or develop any drainage please call our office at [phone]  2. No Driving until cleared by Orvan Seen' office and you are no longer using narcotic pain medications  3. Monitor your weight daily.. Please use the same scale and weigh at same time... If you gain 5-10 lbs in 48 hours with associated lower extremity swelling, please contact our office at [phone]  4. Fever of 101.5 for at least 24 hours with no source, please contact our office at [phone]  5. Activity- up as tolerated, please walk at least 3 times per day.  Avoid strenuous activity, no lifting, pushing, or pulling with your arms over 8-10 lbs for a minimum of 6 weeks  6. If any questions or concerns arise, please do not hesitate to contact our office at [phone]  ----------------------------------- Information on my medicine - ELIQUIS (apixaban)  Why was Eliquis prescribed for you? Eliquis was prescribed to treat blood clots that may have been found in the veins of your legs (deep vein thrombosis) or in your lungs (pulmonary embolism) and to reduce the risk of them occurring again.  What do You need to know about Eliquis ? The  dose is ONE 5 mg tablet taken TWICE daily.  Eliquis may be taken with or without food.   Try to take the dose about the same time in the morning and in the evening. If you have difficulty swallowing the tablet whole please discuss with your pharmacist how to take the medication safely.  Take Eliquis exactly as prescribed and DO NOT stop taking Eliquis without talking to the doctor who prescribed the medication.  Stopping may increase your risk of developing a new blood clot.  Refill your  prescription before you run out.  After discharge, you should have regular check-up appointments with your healthcare provider that is prescribing your Eliquis.    What do you do if you miss a dose? If a dose of ELIQUIS is not taken at the scheduled time, take it as soon as possible on the same day and twice-daily administration should be resumed. The dose should not be doubled to make up for a missed dose.  Important Safety Information A possible side effect of Eliquis is bleeding. You should call your healthcare provider right away if you experience any of the following: ? Bleeding from an injury or your nose that does not stop. ? Unusual colored urine (red or dark brown) or unusual colored stools (red or black). ? Unusual bruising for unknown reasons. ? A serious fall or if you hit your head (even if there is no bleeding).  Some medicines may interact with Eliquis and might increase your risk of bleeding or clotting while on Eliquis. To help avoid this, consult your healthcare provider or pharmacist prior to using any new prescription or non-prescription medications, including herbals, vitamins, non-steroidal anti-inflammatory drugs (NSAIDs) and supplements.  This website has more information on Eliquis (apixaban): http://www.eliquis.com/eliquis/home

## 2019-12-01 NOTE — Progress Notes (Signed)
Spoke with RN Madison via secure chat and RN is aware of the DC central line order for this patient

## 2019-12-01 NOTE — Plan of Care (Signed)
  Problem: Clinical Measurements: Goal: Cardiovascular complication will be avoided Outcome: Progressing   Problem: Coping: Goal: Level of anxiety will decrease Outcome: Progressing   Problem: Clinical Measurements: Goal: Postoperative complications will be avoided or minimized Outcome: Progressing

## 2019-12-01 NOTE — Care Management (Addendum)
[id number] Benefits check submitted for Eliquis for cost. Graves-Bigelow, Ocie Cornfield, RN, BSN Case Manager [phone]    Previous benefits check had been completed with cost below.  Transition of Care [facility]) Benefit Eligibility Note    Patient Details  Name: GRAYCEN VELASTEGUI MRN: [id number] Date of Birth: July 14, 1951   Medication/Dose: Arne Cleveland  5 MG BID  Covered?: Yes  Tier: 2 Drug  Prescription Coverage [facility]erred Pharmacy[facility]  Spoke with Person/Company/Phone Number:: [facility]   @ [employer] # [phone]  Co-Pay: $ 40.00  Prior Approval: No  Deductible: Unmet(PATIENT IN INITIAL COVERAGE)  Additional Notes: XARELTO 20 MG DAILY . COVER-YES, CO-PAY- $40.00, TIER- 2 DRUG, P/A-NO   Memory Argue Phone Number: 11/27/2019, 10:30 AM

## 2019-12-02 ENCOUNTER — Inpatient Hospital Stay (HOSPITAL_COMMUNITY): Payer: Medicare PPO

## 2019-12-02 LAB — GLUCOSE, CAPILLARY
Glucose-Capillary: 114 mg/dL — ABNORMAL HIGH (ref 70–99)
Glucose-Capillary: 111 mg/dL — ABNORMAL HIGH (ref 70–99)
Glucose-Capillary: 78 mg/dL (ref 70–99)
Glucose-Capillary: 92 mg/dL (ref 70–99)
Glucose-Capillary: 144 mg/dL — ABNORMAL HIGH (ref 70–99)
Glucose-Capillary: 128 mg/dL — ABNORMAL HIGH (ref 70–99)

## 2019-12-02 LAB — CBC
HCT: 32.8 % — ABNORMAL LOW (ref 36.0–46.0)
Hemoglobin: 10.9 g/dL — ABNORMAL LOW (ref 12.0–15.0)
MCH: 30.4 pg (ref 26.0–34.0)
MCHC: 33.2 g/dL (ref 30.0–36.0)
MCV: 91.4 fL (ref 80.0–100.0)
Platelets: 278 10*3/uL (ref 150–400)
RBC: 3.59 MIL/uL — ABNORMAL LOW (ref 3.87–5.11)
RDW: 14.6 % (ref 11.5–15.5)
WBC: 13 10*3/uL — ABNORMAL HIGH (ref 4.0–10.5)
nRBC: 0 % (ref 0.0–0.2)

## 2019-12-02 LAB — BASIC METABOLIC PANEL
Anion gap: 10 (ref 5–15)
BUN: 15 mg/dL (ref 8–23)
CO2: 21 mmol/L — ABNORMAL LOW (ref 22–32)
Calcium: 8.5 mg/dL — ABNORMAL LOW (ref 8.9–10.3)
Chloride: 103 mmol/L (ref 98–111)
Creatinine, Ser: 0.77 mg/dL (ref 0.44–1.00)
GFR calc Af Amer: >60 mL/min (ref >60)
GFR calc non Af Amer: >60 mL/min (ref >60)
Glucose, Bld: 113 mg/dL — ABNORMAL HIGH (ref 70–99)
Potassium: 3.5 mmol/L (ref 3.5–5.1)
Sodium: 134 mmol/L — ABNORMAL LOW (ref 135–145)

## 2019-12-02 MED ORDER — METOPROLOL TARTRATE 25 MG/10 ML ORAL SUSPENSION: 12.5000 mg | Freq: Two times a day (BID) | ORAL | Status: DC

## 2019-12-02 MED ORDER — AMLODIPINE BESYLATE 5 MG PO TABS
5.0000 mg | ORAL_TABLET | Freq: Every day | ORAL | Status: DC
  Administered 2019-12-02 – 2019-12-04 (×3): 5 mg via ORAL
  Filled 2019-12-02 (×3): qty 1

## 2019-12-02 MED ORDER — FUROSEMIDE 10 MG/ML IJ SOLN
40.0000 mg | Freq: Every day | INTRAMUSCULAR | Status: DC
  Administered 2019-12-02 – 2019-12-04 (×3): 40 mg via INTRAVENOUS
  Filled 2019-12-02 (×3): qty 4

## 2019-12-02 MED ORDER — METOPROLOL TARTRATE 25 MG PO TABS
25.0000 mg | ORAL_TABLET | Freq: Two times a day (BID) | ORAL | Status: DC
  Administered 2019-12-02 – 2019-12-04 (×5): 25 mg via ORAL
  Filled 2019-12-02 (×5): qty 1

## 2019-12-02 NOTE — Progress Notes (Signed)
3 Days Post-Op Procedure(s) (LRB): CORONARY ARTERY BYPASS GRAFTING (CABG) using the LIMA to LAD; RIMA to PL; Endoscopic harvested right greater saphenous vein: SVC to Diag; SVC to OM1. (N/A) TRANSESOPHAGEAL ECHOCARDIOGRAM (TEE) (N/A) INDOCYANINE GREEN FLUORESCENCE IMAGING (ICG) (N/A) Endovein Harvest Of Greater Saphenous Vein (Right) Subjective: No complaints  Objective: Vital signs in last 24 hours: Temp:  [98.3 F (36.8 C)-98.9 F (37.2 C)] 98.9 F (37.2 C) (02/20 0724) Pulse Rate:  [66-78] 78 (02/20 0724) Cardiac Rhythm: Normal sinus rhythm (02/20 0701) Resp:  [18-35] 30 (02/20 0724) BP: (107-136)/(57-98) 136/71 (02/20 0724) SpO2:  [95 %-100 %] 95 % (02/20 0724) Weight:  [81.9 kg] 81.9 kg (02/20 0631)  Hemodynamic parameters for last 24 hours:    Intake/Output from previous day: 02/19 0701 - 02/20 0700 In: 1822.9 [P.O.:700; I.V.:1122.9] Out: 1740 [Urine:1550; Chest Tube:190] Intake/Output this shift: No intake/output data recorded.  General appearance: alert and cooperative Neurologic: intact Heart: regular rate and rhythm, S1, S2 normal, no murmur, click, rub or gallop Lungs: clear to auscultation bilaterally Abdomen: soft, non-tender; bowel sounds normal; no masses,  no organomegaly Extremities: extremities normal, atraumatic, no cyanosis or edema Wound: c/d/i  Lab Results: Recent Labs    12/01/19 0500 12/02/19 0232  WBC 14.1* 13.0*  HGB 11.2* 10.9*  HCT 33.7* 32.8*  PLT 264 278   BMET:  Recent Labs    12/01/19 0500 12/02/19 0232  NA 136 134*  K 4.0 3.5  CL 104 103  CO2 25 21*  GLUCOSE 96 113*  BUN 11 15  CREATININE 0.88 0.77  CALCIUM 8.7* 8.5*    PT/INR:  Recent Labs    11/29/19 1506  LABPROT 17.9*  INR 1.5*   ABG    Component Value Date/Time   PHART 7.366 11/29/2019 2109   HCO3 22.1 11/29/2019 2109   TCO2 23 11/29/2019 2109   ACIDBASEDEF 3.0 (H) 11/29/2019 2109   O2SAT 97.0 11/29/2019 2109   CBG (last 3)  Recent Labs     12/01/19 2347 12/02/19 0347 12/02/19 0721  GLUCAP 74 114* 111*    Assessment/Plan: S/P Procedure(s) (LRB): CORONARY ARTERY BYPASS GRAFTING (CABG) using the LIMA to LAD; RIMA to PL; Endoscopic harvested right greater saphenous vein: SVC to Diag; SVC to OM1. (N/A) TRANSESOPHAGEAL ECHOCARDIOGRAM (TEE) (N/A) INDOCYANINE GREEN FLUORESCENCE IMAGING (ICG) (N/A) Endovein Harvest Of Greater Saphenous Vein (Right) Mobilize Diuresis d/c pacing wires d/c tubes/lines meds adjusted; anticipate discharge Mon-Tues   LOS: 6 days    Wonda Olds 12/02/2019

## 2019-12-02 NOTE — Plan of Care (Signed)
  Problem: Education: Goal: Knowledge of General Education information will improve Description: Including pain rating scale, medication(s)/side effects and non-pharmacologic comfort measures Outcome: Progressing   Problem: Health Behavior/Discharge Planning: Goal: Ability to manage health-related needs will improve Outcome: Progressing   Problem: Clinical Measurements: Goal: Ability to maintain clinical measurements within normal limits will improve Outcome: Progressing Goal: Will remain free from infection Outcome: Progressing Goal: Diagnostic test results will improve Outcome: Progressing   Problem: Coping: Goal: Level of anxiety will decrease Outcome: Progressing   Problem: Pain Managment: Goal: General experience of comfort will improve Outcome: Progressing   Problem: Safety: Goal: Ability to remain free from injury will improve Outcome: Progressing   Problem: Education: Goal: Will demonstrate proper wound care and an understanding of methods to prevent future damage Outcome: Progressing Goal: Knowledge of disease or condition will improve Outcome: Progressing

## 2019-12-02 NOTE — Progress Notes (Addendum)
CARDIAC REHAB PHASE I   PRE:  Rate/Rhythm: 83 SR  BP:  Supine:   Sitting: 147/74  Standing:    SaO2: 100 RA  MODE:  Ambulation: 400 ft   POST:  Rate/Rhythm: 94 SR  BP:  Supine:   Sitting: 139/81  Standing:    SaO2: 97 RA 1122-1154  Assisted X 1 to ambulate with just hand held assist, first to the bathroom and then to the hall. Pt tolerated walk well. VS stable, steady gait.  Pt states that she is feeling much better today. Pt back to recliner after walk with call light in reach.  Rodney Langton RN 12/02/2019 11:46 AM

## 2019-12-02 NOTE — Progress Notes (Addendum)
RN at bedside to assist Dr. Orvan Seen with chest tube and pacer wire removal. All material brought to bedside to assist. IV ketorolac given per Dr. Orvan Seen before tubes removed. Sites redressed with dry gauze and tape. Patient tolerated well. Patient VSS. Patient advise to rest in the chair post removal.  RN will continue to monitor.

## 2019-12-03 ENCOUNTER — Inpatient Hospital Stay (HOSPITAL_COMMUNITY): Payer: Medicare PPO

## 2019-12-03 LAB — GLUCOSE, CAPILLARY
Glucose-Capillary: 122 mg/dL — ABNORMAL HIGH (ref 70–99)
Glucose-Capillary: 117 mg/dL — ABNORMAL HIGH (ref 70–99)
Glucose-Capillary: 124 mg/dL — ABNORMAL HIGH (ref 70–99)
Glucose-Capillary: 137 mg/dL — ABNORMAL HIGH (ref 70–99)
Glucose-Capillary: 114 mg/dL — ABNORMAL HIGH (ref 70–99)

## 2019-12-03 NOTE — Plan of Care (Signed)
  Problem: Education: Goal: Knowledge of General Education information will improve Description: Including pain rating scale, medication(s)/side effects and non-pharmacologic comfort measures Outcome: Progressing   Problem: Health Behavior/Discharge Planning: Goal: Ability to manage health-related needs will improve Outcome: Progressing   Problem: Clinical Measurements: Goal: Ability to maintain clinical measurements within normal limits will improve Outcome: Progressing Goal: Will remain free from infection Outcome: Progressing Goal: Diagnostic test results will improve Outcome: Progressing Goal: Respiratory complications will improve Outcome: Progressing Goal: Cardiovascular complication will be avoided Outcome: Progressing   Problem: Activity: Goal: Risk for activity intolerance will decrease Outcome: Progressing   Problem: Nutrition: Goal: Adequate nutrition will be maintained Outcome: Progressing   Problem: Coping: Goal: Level of anxiety will decrease Outcome: Progressing   Problem: Elimination: Goal: Will not experience complications related to bowel motility Outcome: Progressing Goal: Will not experience complications related to urinary retention Outcome: Progressing   Problem: Pain Managment: Goal: General experience of comfort will improve Outcome: Progressing   Problem: Safety: Goal: Ability to remain free from injury will improve Outcome: Progressing   Problem: Skin Integrity: Goal: Risk for impaired skin integrity will decrease Outcome: Progressing   Problem: Education: Goal: Will demonstrate proper wound care and an understanding of methods to prevent future damage Outcome: Progressing Goal: Knowledge of disease or condition will improve Outcome: Progressing Goal: Knowledge of the prescribed therapeutic regimen will improve Outcome: Progressing Goal: Individualized Educational Video(s) Outcome: Progressing   Problem: Activity: Goal: Risk for  activity intolerance will decrease Outcome: Progressing   Problem: Cardiac: Goal: Will achieve and/or maintain hemodynamic stability Outcome: Progressing   Problem: Clinical Measurements: Goal: Postoperative complications will be avoided or minimized Outcome: Progressing   Problem: Respiratory: Goal: Respiratory status will improve Outcome: Progressing   Problem: Skin Integrity: Goal: Wound healing without signs and symptoms of infection Outcome: Progressing Goal: Risk for impaired skin integrity will decrease Outcome: Progressing   Problem: Urinary Elimination: Goal: Ability to achieve and maintain adequate renal perfusion and functioning will improve Outcome: Progressing   Problem: Activity: Goal: Ability to tolerate increased activity will improve Outcome: Progressing   Problem: Respiratory: Goal: Ability to maintain a clear airway and adequate ventilation will improve Outcome: Progressing   Problem: Role Relationship: Goal: Method of communication will improve Outcome: Progressing   

## 2019-12-03 NOTE — Progress Notes (Signed)
4 Days Post-Op Procedure(s) (LRB): CORONARY ARTERY BYPASS GRAFTING (CABG) using the LIMA to LAD; RIMA to PL; Endoscopic harvested right greater saphenous vein: SVC to Diag; SVC to OM1. (N/A) TRANSESOPHAGEAL ECHOCARDIOGRAM (TEE) (N/A) INDOCYANINE GREEN FLUORESCENCE IMAGING (ICG) (N/A) Endovein Harvest Of Greater Saphenous Vein (Right) Subjective: Feeling well; looking forward to discharge  Objective: Vital signs in last 24 hours: Temp:  [98 F (36.7 C)-99.4 F (37.4 C)] 98 F (36.7 C) (02/21 0408) Pulse Rate:  [62-86] 63 (02/21 0410) Cardiac Rhythm: Normal sinus rhythm (02/21 0700) Resp:  [19-25] 19 (02/21 0410) BP: (117-147)/(65-79) 125/71 (02/21 0408) SpO2:  [66 %-100 %] 95 % (02/21 0410) Weight:  [80.1 kg] 80.1 kg (02/21 0500)  Hemodynamic parameters for last 24 hours:    Intake/Output from previous day: 02/20 0701 - 02/21 0700 In: 480 [P.O.:470; I.V.:10] Out: 240 [Urine:150; Chest Tube:90] Intake/Output this shift: No intake/output data recorded.  General appearance: alert and cooperative Neurologic: intact Heart: regular rate and rhythm, S1, S2 normal, no murmur, click, rub or gallop Lungs: clear to auscultation bilaterally Abdomen: soft, non-tender; bowel sounds normal; no masses,  no organomegaly Extremities: extremities normal, atraumatic, no cyanosis or edema Wound: dressed, dry  Lab Results: Recent Labs    12/01/19 0500 12/02/19 0232  WBC 14.1* 13.0*  HGB 11.2* 10.9*  HCT 33.7* 32.8*  PLT 264 278   BMET:  Recent Labs    12/01/19 0500 12/02/19 0232  NA 136 134*  K 4.0 3.5  CL 104 103  CO2 25 21*  GLUCOSE 96 113*  BUN 11 15  CREATININE 0.88 0.77  CALCIUM 8.7* 8.5*    PT/INR: No results for input(s): LABPROT, INR in the last 72 hours. ABG    Component Value Date/Time   PHART 7.366 11/29/2019 2109   HCO3 22.1 11/29/2019 2109   TCO2 23 11/29/2019 2109   ACIDBASEDEF 3.0 (H) 11/29/2019 2109   O2SAT 97.0 11/29/2019 2109   CBG (last 3)  Recent  Labs    12/02/19 1932 12/02/19 2327 12/03/19 0443  GLUCAP 144* 128* 122*    Assessment/Plan: S/P Procedure(s) (LRB): CORONARY ARTERY BYPASS GRAFTING (CABG) using the LIMA to LAD; RIMA to PL; Endoscopic harvested right greater saphenous vein: SVC to Diag; SVC to OM1. (N/A) TRANSESOPHAGEAL ECHOCARDIOGRAM (TEE) (N/A) INDOCYANINE GREEN FLUORESCENCE IMAGING (ICG) (N/A) Endovein Harvest Of Greater Saphenous Vein (Right) Mobilize anticipate discharge tomorrow   LOS: 7 days    Miranda [name] 12/03/2019

## 2019-12-04 LAB — GLUCOSE, CAPILLARY
Glucose-Capillary: 111 mg/dL — ABNORMAL HIGH (ref 70–99)
Glucose-Capillary: 114 mg/dL — ABNORMAL HIGH (ref 70–99)
Glucose-Capillary: 123 mg/dL — ABNORMAL HIGH (ref 70–99)

## 2019-12-04 MED ORDER — METOPROLOL TARTRATE 25 MG PO TABS: 25.0000 mg | ORAL_TABLET | Freq: Two times a day (BID) | ORAL | 2 refills | Status: DC

## 2019-12-04 MED ORDER — APIXABAN 5 MG PO TABS: 5.0000 mg | ORAL_TABLET | Freq: Two times a day (BID) | ORAL | 2 refills | Status: DC

## 2019-12-04 MED ORDER — CLOPIDOGREL BISULFATE 75 MG PO TABS: 75.0000 mg | ORAL_TABLET | Freq: Every day | ORAL | 6 refills | Status: DC

## 2019-12-04 MED ORDER — TRAMADOL HCL 50 MG PO TABS: 50.0000 mg | ORAL_TABLET | ORAL | 0 refills | Status: AC | PRN

## 2019-12-04 MED ORDER — COLCHICINE 0.6 MG PO TABS: 0.3000 mg | ORAL_TABLET | Freq: Two times a day (BID) | ORAL | 1 refills | Status: DC

## 2019-12-04 NOTE — Progress Notes (Signed)
CARDIAC REHAB PHASE I   Pt ambulated 373ft in hallway independently with steady gait. Pt denies pain or SOB. D/c education completed with pt. Pt educated on importance of site care and monitoring incision daily. Encouraged continued IS use, walks, and sternal precautions. Pt given in-the-tube sheet along with heart healthy and diabetic diets. Reviewed restrictions and exercise guidelines. Pt denies needs for home, lives alone, but sister will be checking in on her. Will refer to CRP II [place]. Pt is interested in participating in Virtual Cardiac and Pulmonary Rehab. Pt advised that Virtual Cardiac and Pulmonary Rehab is provided at no cost to the patient.  Checklist:  1. Pt has smart device  ie smartphone and/or ipad for downloading an app  Yes 2. Reliable internet/wifi service    Yes 3. Understands how to use their smartphone and navigate within an app.  Yes  Pt verbalized understanding and is in agreement.  [id number] Rufina Falco, RN BSN 12/04/2019 9:33 AM

## 2019-12-04 NOTE — TOC Progression Note (Signed)
Transition of Care [facility]) - Progression Note    Patient Details  Name: Miranda [name] MRN: [id number] Date of Birth: [birth year]  [facility]) CM/SW Contact  Zenon Mayo, RN Phone Number: 12/04/2019, 9:35 AM  Clinical Narrative:    NCM spoke with patient about the eliquis , Staff RN Suezanne Jacquet gave her the 30 day free trial card.  NCM informed her that the refills copay will cost around 40.00.  She states she understands.        Expected Discharge Plan and Services           Expected Discharge Date: 12/04/19                                     Social Determinants of Health (SDOH) Interventions    Readmission Risk Interventions No flowsheet data found.

## 2019-12-04 NOTE — Care Management Important Message (Signed)
Important Message  Patient Details  Name: Miranda [name] MRN: [id number] Date of Birth: Aug 31, 1951   Medicare Important Message Given:  Yes     Memory Argue 12/04/2019, 9:58 AM

## 2019-12-04 NOTE — Plan of Care (Signed)
  Problem: Education: Goal: Knowledge of General Education information will improve Description: Including pain rating scale, medication(s)/side effects and non-pharmacologic comfort measures Outcome: Progressing   Problem: Clinical Measurements: Goal: Will remain free from infection Outcome: Progressing Goal: Diagnostic test results will improve Outcome: Progressing Goal: Cardiovascular complication will be avoided Outcome: Progressing   Problem: Nutrition: Goal: Adequate nutrition will be maintained Outcome: Progressing   Problem: Coping: Goal: Level of anxiety will decrease Outcome: Progressing   Problem: Safety: Goal: Ability to remain free from injury will improve Outcome: Progressing   Problem: Skin Integrity: Goal: Risk for impaired skin integrity will decrease Outcome: Progressing

## 2019-12-04 NOTE — Progress Notes (Signed)
5 Days Post-Op Procedure(s) (LRB): CORONARY ARTERY BYPASS GRAFTING (CABG) using the LIMA to LAD; RIMA to PL; Endoscopic harvested right greater saphenous vein: SVC to Diag; SVC to OM1. (N/A) TRANSESOPHAGEAL ECHOCARDIOGRAM (TEE) (N/A) INDOCYANINE GREEN FLUORESCENCE IMAGING (ICG) (N/A) Endovein Harvest Of Greater Saphenous Vein (Right) Subjective: Feels good, no complaints. Anxious to return home.   Objective: Vital signs in last 24 hours: Temp:  [97.5 F (36.4 C)-98.7 F (37.1 C)] 98.4 F (36.9 C) (02/22 0355) Pulse Rate:  [68-79] 75 (02/22 0355) Cardiac Rhythm: Normal sinus rhythm (02/22 0009) Resp:  [17-25] 22 (02/22 0355) BP: (116-138)/(67-77) 138/68 (02/22 0355) SpO2:  [94 %-100 %] 99 % (02/22 0355) Weight:  [79.2 kg] 79.2 kg (02/22 0355)     Intake/Output from previous day: 02/21 0701 - 02/22 0700 In: 450 [P.O.:450] Out: -  Intake/Output this shift: No intake/output data recorded.  Physical Exam: General appearance:alert, cooperative and mild distress Neurologic:intact Heart:SR at 70-80. Lungs:clear to auscultation bilaterally Abdomen:Soft and non-tender Extremities:Warm and well perfused. Minimal peripheral edema. Wound:The sternal incision and EVH incisions are well approximated and dry.   Lab Results: Recent Labs    12/02/19 0232  WBC 13.0*  HGB 10.9*  HCT 32.8*  PLT 278   BMET:  Recent Labs    12/02/19 0232  NA 134*  K 3.5  CL 103  CO2 21*  GLUCOSE 113*  BUN 15  CREATININE 0.77  CALCIUM 8.5*    PT/INR: No results for input(s): LABPROT, INR in the last 72 hours. ABG    Component Value Date/Time   PHART 7.366 11/29/2019 2109   HCO3 22.1 11/29/2019 2109   TCO2 23 11/29/2019 2109   ACIDBASEDEF 3.0 (H) 11/29/2019 2109   O2SAT 97.0 11/29/2019 2109   CBG (last 3)  Recent Labs    12/03/19 1954 12/04/19 0007 12/04/19 0353  GLUCAP 114* 114* 123*    Assessment/Plan: S/P Procedure(s) (LRB): CORONARY ARTERY BYPASS GRAFTING  (CABG) using the LIMA to LAD; RIMA to PL; Endoscopic harvested right greater saphenous vein: SVC to Diag; SVC to OM1. 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 Hemodynamically stable on no pressor or inotropic support. Continue Plavix for NSTEMI. No ASA since we have started Eliquis for PE. Continue low-dose B-blocker and resume her Norvasc. -Acute PE RLL pulmonary artery on admission. Begin Eliquis today.  -Pulm- Oxygenating well on [place] O2. Multi-focal pneumonia noted on admission CTA. Resumed oral doxycycline, encourage pulmonary hygiene.   -Hyperlipidemia- statin resumed  -DVT PPX-Continue Eliquis  -Expected acute blood loss anemia- mild, continue to  monitor.  -Endo-Glucose 120-130.  Plan discharge later today. Instructions given. To f/u in 1 week.    LOS: 8 days    Miranda [name], [place] [phone] 12/04/2019

## 2019-12-04 NOTE — Progress Notes (Signed)
[facility] will sign off.   Medication Recommendations:  Continue Plavix (changed from ASA 2nd to NSTEMI on admit), Cretor, Eliquis (for PE) and Metoprolol 25mg  BID Other recommendations (labs, testing, etc):  N/A Follow up as an outpatient:  Has been arranged

## 2019-12-05 ENCOUNTER — Encounter (HOSPITAL_COMMUNITY): Payer: Self-pay | Admitting: *Deleted

## 2019-12-05 NOTE — Progress Notes (Signed)
Referral received and verified for MD signature.  Follow up appt is 12/11/19 with cardiology and [facility]. Insurance benefits and eligibility to be determined upon the satisfactory completion of follow up appt on 3/1. Cherre Huger, BSN Cardiac and Training and development officer

## 2019-12-05 NOTE — Telephone Encounter (Signed)
**Note De-Identified Miranda [name] Obfuscation** Patient contacted regarding discharge from [facility] on 12/04/2019.  Patient understands to follow up with provider Robbie Lis, PA-C on 12/11/2019 at 11:15 at [facility] in [place], [place] [address]. I did give detailed directions to help the pt find the office.  Patient understands discharge instructions? Yes Patient understands medications and regiment? Yes Patient understands to bring all medications to this visit? Yes    Postop Surgical Patients:                What is your wound status? "good"  Any signs/ symptoms of infection? "no" (Temp, redness/ red streaks, swelling, purulent drainage, foul odor or smell)? I have advised the pt to look for these s/s and to call [facility] if she has questions or concerns.  .             Please do not place any creams/ lotions/ or antibiotic ointment on any surgical incisions/ wounds without physician approval. The pt is advised and she verbalizes understanding.  .             Do you have any questions about your medications?  "No" All medications (except pain medications) are to be filled by your Cardiologist AFTER your first post op appointment with them. The pt is advised and she verbalizes understanding.   Are you taking your pain medication? Yes  .             How is your pain controlled? "Well"  Pain level? On a scale of 1 to 10 with 1 being the least and 10 being the worst amount of pain the pt rates her pain at a 1 and states she is in no pain at this time.  .             If you require a refill on pain medications, know that the same medication/ amount may not be prescribed or a refill may not be given.  Please contact your pharmacy for refill requests. The pt is advised and she verbalizes understanding.   .             Do you have help at home with ADL's? "Yes"  If you have home health, have you been contacted or seen by the agency? None ordered per pt.  .             Please refer to your Pre/post surgery booklet, there  is a lot of useful information in it that may answer any questions you may have. The pt is advised and she verbalizes understanding.  .             Please note that it is ok to remove your surgical dressing, shower (soap/ water), and pat the incision dry. The pt is advised and she verbalizes understanding.  For surgery related questions staff will route a phone note to [facility] TOC pool: The pt has none.  [facility] [name], [name]

## 2019-12-09 ENCOUNTER — Other Ambulatory Visit: Payer: Self-pay | Admitting: Cardiothoracic Surgery

## 2019-12-09 DIAGNOSIS — Z951 Presence of aortocoronary bypass graft: Secondary | ICD-10-CM

## 2019-12-11 ENCOUNTER — Ambulatory Visit
Admission: RE | Admit: 2019-12-11 | Discharge: 2019-12-11 | Disposition: A | Discharge status: Home / Self Care | Payer: Medicare PPO | Source: Ambulatory Visit | Attending: Cardiothoracic Surgery | Admitting: Cardiothoracic Surgery

## 2019-12-11 ENCOUNTER — Other Ambulatory Visit: Payer: Self-pay

## 2019-12-11 ENCOUNTER — Ambulatory Visit (INDEPENDENT_AMBULATORY_CARE_PROVIDER_SITE_OTHER): Payer: Self-pay | Admitting: Cardiothoracic Surgery

## 2019-12-11 ENCOUNTER — Ambulatory Visit: Payer: Medicare PPO | Admitting: Physician Assistant

## 2019-12-11 VITALS — BP 180/80 | HR 90 | Resp 20 | Ht 68.0 in | Wt 174.0 lb

## 2019-12-11 DIAGNOSIS — Z951 Presence of aortocoronary bypass graft: Secondary | ICD-10-CM

## 2019-12-11 NOTE — Progress Notes (Signed)
      [address]       [place] [address]             [phone]     CARDIOTHORACIC SURGERY OFFICE NOTE  Referring Provider is Martinique, Peter M, MD Primary Cardiologist is No primary care provider on file. PCP is Odem, Coolidge Breeze, FNP   HPI: 69 yo lady underwent CABG approximately 10 days ago. Did very well postoperatively and was discharged in short order. Presents for routine f/u. Only complaint is left ankle and right elbow tenderness. No chest pain or SOB.    Current Outpatient Medications  Medication Sig Dispense Refill  . albuterol (VENTOLIN HFA) 108 (90 Base) MCG/ACT inhaler Inhale 2 puffs into the lungs every 4 (four) hours as needed for wheezing or shortness of breath.     Marland Kitchen apixaban (ELIQUIS) 5 MG TABS tablet Take 1 tablet (5 mg total) by mouth 2 (two) times daily. 60 tablet 2  . ascorbic acid (VITAMIN C) 500 MG tablet Take 500 mg by mouth daily.    . clopidogrel (PLAVIX) 75 MG tablet Take 1 tablet (75 mg total) by mouth daily. 30 tablet 6  . colchicine 0.6 MG tablet Take 0.5 tablets (0.3 mg total) by mouth 2 (two) times daily. 14 tablet 1  . Fluticasone-Salmeterol (ADVAIR) 100-50 MCG/DOSE AEPB Inhale 1 puff into the lungs daily as needed (SOB).     . metoprolol tartrate (LOPRESSOR) 25 MG tablet Take 1 tablet (25 mg total) by mouth 2 (two) times daily. 60 tablet 2  . rosuvastatin (CRESTOR) 20 MG tablet Take 20 mg by mouth daily.     . traMADol (ULTRAM) 50 MG tablet Take 1 tablet (50 mg total) by mouth every 4 (four) hours as needed for up to 7 days for moderate pain. 28 tablet 0  . vitamin E (VITAMIN E) 180 MG (400 UNITS) capsule Take 400 Units by mouth daily.     No current facility-administered medications for this visit.      Physical Exam:   BP (!) 180/80   Pulse 90   Resp 20   Ht 5\' 8"  (1.727 m)   Wt 78.9 kg   SpO2 93% Comment: RA  BMI 26.46 kg/m   General:  Well-appearing,  NAD  Chest:   cta  CV:   rrr  Incisions:  C/d/i  Abdomen:  sntnd  Extremities:  Right elbow and left ankle mild swelling and tenderness, not hot  Diagnostic Tests:  CXR with clear lung fields   Impression:  Doing very well after CABG.   Plan:  Ok to take naproxen for left ankle pain  I spent in excess of 20 minutes during the conduct of this office consultation and >50% of this time involved direct face-to-face encounter with the patient for counseling and/or coordination of their care.  Level 2                 10 minutes Level 3                 15 minutes Level 4                 25 minutes Level 5                 40 minutes  B. Murvin Natal, MD 12/11/2019 6:18 PM

## 2019-12-14 ENCOUNTER — Ambulatory Visit: Payer: Medicare PPO

## 2019-12-19 ENCOUNTER — Encounter (HOSPITAL_COMMUNITY): Payer: Self-pay | Admitting: Emergency Medicine

## 2019-12-19 ENCOUNTER — Ambulatory Visit: Payer: Medicare PPO | Admitting: Physician Assistant

## 2019-12-19 ENCOUNTER — Other Ambulatory Visit: Payer: Self-pay

## 2019-12-19 ENCOUNTER — Emergency Department (HOSPITAL_COMMUNITY)
Admission: EM | Admit: 2019-12-19 | Discharge: 2019-12-20 | Disposition: A | Discharge status: Home / Self Care | Payer: Medicare PPO | Attending: Emergency Medicine | Admitting: Emergency Medicine

## 2019-12-19 DIAGNOSIS — J449 Chronic obstructive pulmonary disease, unspecified: Secondary | ICD-10-CM | POA: Insufficient documentation

## 2019-12-19 DIAGNOSIS — R2242 Localized swelling, mass and lump, left lower limb: Secondary | ICD-10-CM | POA: Diagnosis present

## 2019-12-19 DIAGNOSIS — Z79899 Other long term (current) drug therapy: Secondary | ICD-10-CM | POA: Diagnosis not present

## 2019-12-19 DIAGNOSIS — I10 Essential (primary) hypertension: Secondary | ICD-10-CM | POA: Insufficient documentation

## 2019-12-19 DIAGNOSIS — Z7901 Long term (current) use of anticoagulants: Secondary | ICD-10-CM | POA: Insufficient documentation

## 2019-12-19 DIAGNOSIS — M109 Gout, unspecified: Secondary | ICD-10-CM | POA: Diagnosis not present

## 2019-12-19 DIAGNOSIS — Z87891 Personal history of nicotine dependence: Secondary | ICD-10-CM | POA: Diagnosis not present

## 2019-12-19 DIAGNOSIS — Z951 Presence of aortocoronary bypass graft: Secondary | ICD-10-CM | POA: Insufficient documentation

## 2019-12-19 DIAGNOSIS — M25472 Effusion, left ankle: Secondary | ICD-10-CM

## 2019-12-19 LAB — BASIC METABOLIC PANEL
Anion gap: 14 (ref 5–15)
BUN: 7 mg/dL — ABNORMAL LOW (ref 8–23)
CO2: 23 mmol/L (ref 22–32)
Calcium: 9.6 mg/dL (ref 8.9–10.3)
Chloride: 103 mmol/L (ref 98–111)
Creatinine, Ser: 0.76 mg/dL (ref 0.44–1.00)
GFR calc Af Amer: >60 mL/min (ref >60)
GFR calc non Af Amer: >60 mL/min (ref >60)
Glucose, Bld: 88 mg/dL (ref 70–99)
Potassium: 3.3 mmol/L — ABNORMAL LOW (ref 3.5–5.1)
Sodium: 140 mmol/L (ref 135–145)

## 2019-12-19 LAB — CBC
HCT: 35.5 % — ABNORMAL LOW (ref 36.0–46.0)
Hemoglobin: 11.4 g/dL — ABNORMAL LOW (ref 12.0–15.0)
MCH: 29.5 pg (ref 26.0–34.0)
MCHC: 32.1 g/dL (ref 30.0–36.0)
MCV: 92 fL (ref 80.0–100.0)
Platelets: 404 10*3/uL — ABNORMAL HIGH (ref 150–400)
RBC: 3.86 MIL/uL — ABNORMAL LOW (ref 3.87–5.11)
RDW: 14.1 % (ref 11.5–15.5)
WBC: 10.8 10*3/uL — ABNORMAL HIGH (ref 4.0–10.5)
nRBC: 0 % (ref 0.0–0.2)

## 2019-12-19 NOTE — ED Triage Notes (Signed)
Pt comes from UC with left foot swelling and pain. Hx of gout. Had bypass surgery recently, denies CP or SOB.

## 2019-12-20 ENCOUNTER — Emergency Department (HOSPITAL_COMMUNITY): Payer: Medicare PPO

## 2019-12-20 LAB — BRAIN NATRIURETIC PEPTIDE: B Natriuretic Peptide: 124.3 pg/mL — ABNORMAL HIGH (ref 0.0–100.0)

## 2019-12-20 MED ORDER — HYDROCODONE-ACETAMINOPHEN 5-325 MG PO TABS
1.0000 | ORAL_TABLET | Freq: Once | ORAL | Status: AC
  Administered 2019-12-20: 1 via ORAL
  Filled 2019-12-20: qty 1

## 2019-12-20 MED ORDER — PREDNISONE 20 MG PO TABS
60.0000 mg | ORAL_TABLET | Freq: Once | ORAL | Status: AC
  Administered 2019-12-20: 60 mg via ORAL
  Filled 2019-12-20: qty 3

## 2019-12-20 MED ORDER — OXYCODONE-ACETAMINOPHEN 5-325 MG PO TABS: 1.0000 | ORAL_TABLET | Freq: Four times a day (QID) | ORAL | 0 refills | Status: DC | PRN

## 2019-12-20 MED ORDER — METHYLPREDNISOLONE 4 MG PO TBPK: ORAL_TABLET | ORAL | 0 refills | Status: DC

## 2019-12-20 NOTE — Discharge Instructions (Addendum)
You were seen today for left ankle swelling.  This is likely related to your gout.  Either continue your colchicine and take Percocet for breakthrough pain or you may start the Medrol Dosepak and discontinue colchicine with Percocet for breakthrough pain.  Follow-up with your primary physician.

## 2019-12-20 NOTE — ED Notes (Signed)
Pt was discharged from the ED. Pt read and understood discharge paperwork. Pt had vital signs completed. Pt conscious, breathing, and A&Ox4. No distress noted. Pt speaking in complete sentences. Pt brought out of the ED via wheelchair. E-signature not available. 

## 2019-12-20 NOTE — ED Notes (Signed)
Pt came to the ED per triage complaint. Pt conscious, breathing, and A&Ox4. Pt brought back to [facility] via wheelchair. Pt endorses "I came from urgent care for bilateral foot swelling and pain". Chest rise and fall equally with non-labored breathing. Lungs clear apex to base. Abd soft and non-tender. Pt denies chest pain, n/v/d, shortness of breath, and f/c. Pt endorses bilateral foot pain. Pt endorses pain 8 out of 10 pain that's vice-like. Bed in lowest position with call light within reach. Pt on continuous blood pressure, pulse ox, and cardiac monitor. Will continue to monitor. Awaiting MD eval. No distress noted.

## 2019-12-20 NOTE — ED Notes (Signed)
BNP sent to lab.

## 2019-12-20 NOTE — ED Provider Notes (Signed)
[facility]PART[facility] Provider Note   CSN: [id number] Arrival date & time: 12/19/19  1753     History Chief Complaint  Patient presents with  . Leg Swelling    Miranda [name] is a 69 y.o. female.  HPI     This is a 69 year old female with a history of hypertension, hyperlipidemia, coronary artery disease, and gout who presents with left leg swelling.  Patient reports she has had intermittent swelling of bilateral lower extremity since her surgery in February.  She had a coronary artery bypass graft.  Patient states that she has noted increasing swelling and significant pain in the left ankle.  She has a history of gout and began taking her colchicine which usually helps.  However, this time does not seem to help.  She rates her pain at 9 out of 10.  She is currently on Eliquis.  She denies any calf swelling or tenderness.  Denies any chest pain, shortness of breath, nausea, vomiting.  Denies any fevers.  Past Medical History:  Diagnosis Date  . COPD (chronic obstructive pulmonary disease) ([facility])   . HTN (hypertension)   . Hypertension   . Pneumonia 11/2019    Patient Active Problem List   Diagnosis Date Noted  . 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 NSTEMI (non-ST elevated myocardial infarction) ([facility]) 11/26/2019  . Multifocal pneumonia 11/26/2019  . Hyperlipidemia 11/26/2019  . Anemia 11/26/2019  . COPD exacerbation ([facility]) 08/02/2017  . History of gout 09/18/2016  . Hypertension 03/12/2016    Past Surgical History:  Procedure Laterality Date  . ABDOMINAL HYSTERECTOMY    . CORONARY ARTERY BYPASS GRAFT N/A 11/29/2019   Procedure: CORONARY ARTERY BYPASS GRAFTING (CABG) using the LIMA to LAD; RIMA to PL; Endoscopic harvested right greater saphenous vein: SVC to Diag; SVC to OM1.;  Surgeon: Wonda Olds, MD;  Location: [facility];  Service: Open Heart Surgery;  Laterality: N/A;  bilateral IMA  . ENDOVEIN HARVEST OF GREATER SAPHENOUS VEIN Right 11/29/2019   Procedure: [place] Of Greater Saphenous Vein;  Surgeon: Wonda Olds, MD;  Location: [facility];  Service: Open Heart Surgery;  Laterality: Right;  . LEFT HEART CATH AND CORONARY ANGIOGRAPHY N/A 11/27/2019   Procedure: LEFT HEART CATH AND CORONARY ANGIOGRAPHY;  Surgeon: Martinique, Peter M, MD;  Location: [facility];  Service: Cardiovascular;  Laterality: N/A;  . TEE WITHOUT CARDIOVERSION N/A 11/29/2019   Procedure: TRANSESOPHAGEAL ECHOCARDIOGRAM (TEE);  Surgeon: Wonda Olds, MD;  Location: [facility];  Service: Open Heart Surgery;  Laterality: N/A;     OB History   No obstetric history on file.     Family History  Problem Relation Age of Onset  . Coronary artery disease Mother     Social History   Tobacco Use  . Smoking status: Former Smoker    Years: 15.00  . Smokeless tobacco: Never Used  Substance Use Topics  . Alcohol use: Yes    Alcohol/week: 0.0 standard drinks    Comment: ocaasional  . Drug use: Never    Home Medications Prior to Admission medications   Medication Sig Start Date End Date Taking? Authorizing Provider  albuterol (VENTOLIN HFA) 108 (90 Base) MCG/ACT inhaler Inhale 2 puffs into the lungs every 4 (four) hours as needed for wheezing or shortness of breath.  11/03/19  Yes [provider]  apixaban (ELIQUIS) 5 MG TABS tablet Take 1 tablet (5 mg total) by mouth 2 (two) times daily. 12/04/19  Yes  Antony Odea, PA-C  ascorbic acid (VITAMIN C) 500 MG tablet Take 500 mg by mouth daily.   Yes [provider]  clopidogrel (PLAVIX) 75 MG tablet Take 1 tablet (75 mg total) by mouth daily. 12/04/19  Yes Roddenberry, Arlis Porta, PA-C  colchicine 0.6 MG tablet Take 0.5 tablets (0.3 mg total) by mouth 2 (two) times daily. 12/04/19  Yes Roddenberry, Myron G, PA-C  Fluticasone-Salmeterol (ADVAIR) 100-50 MCG/DOSE AEPB Inhale 1 puff into the lungs daily as needed (SOB).  11/03/19  Yes [provider]  metoprolol tartrate (LOPRESSOR) 25 MG  tablet Take 1 tablet (25 mg total) by mouth 2 (two) times daily. 12/04/19  Yes Roddenberry, Arlis Porta, PA-C  rosuvastatin (CRESTOR) 20 MG tablet Take 20 mg by mouth daily.  11/03/19  Yes [provider]  vitamin E (VITAMIN E) 180 MG (400 UNITS) capsule Take 400 Units by mouth daily.   Yes [provider]  methylPREDNISolone (MEDROL DOSEPAK) 4 MG TBPK tablet Take as directed on packet. 12/20/19   Keerthana Vanrossum, Barbette Hair, MD  oxyCODONE-acetaminophen (PERCOCET/ROXICET) 5-325 MG tablet Take 1 tablet by mouth every 6 (six) hours as needed for severe pain. 12/20/19   Darick Fetters, Barbette Hair, MD    Allergies    Patient has no known allergies.  Review of Systems   Review of Systems  Constitutional: Negative for fever.  Respiratory: Negative for shortness of breath.   Cardiovascular: Positive for leg swelling. Negative for chest pain.  Gastrointestinal: Negative for abdominal pain, nausea and vomiting.  Musculoskeletal:       Ankle pain  Skin: Negative for color change.  All other systems reviewed and are negative.   Physical Exam Updated Vital Signs BP (!) 147/69 (BP Location: Right Arm)   Pulse 90   Temp 98.8 F (37.1 C) (Oral)   Resp 16   Ht 1.727 m (5\' 8" )   Wt 78.9 kg   SpO2 99%   BMI 26.46 kg/m   Physical Exam Vitals and nursing note reviewed.  Constitutional:      Appearance: She is well-developed. She is not ill-appearing.  HENT:     Head: Normocephalic and atraumatic.     Mouth/Throat:     Mouth: Mucous membranes are moist.  Eyes:     Pupils: Pupils are equal, round, and reactive to light.  Cardiovascular:     Rate and Rhythm: Normal rate and regular rhythm.     Heart sounds: Normal heart sounds.     Comments: Well-healing sternotomy incision, clean dry and intact Pulmonary:     Effort: Pulmonary effort is normal. No respiratory distress.     Breath sounds: No wheezing.  Abdominal:     General: Bowel sounds are normal.     Palpations: Abdomen is soft.      Tenderness: There is no abdominal tenderness.  Musculoskeletal:     Cervical back: Neck supple.     Right lower leg: Edema present.     Left lower leg: Edema present.     Comments: Left greater than right bilateral foot and ankle swelling, pain with range of motion of the left ankle, slight warmth to touch, no overlying erythema, tender to palpation  Skin:    General: Skin is warm and dry.  Neurological:     Mental Status: She is alert and oriented to person, place, and time.  Psychiatric:        Mood and Affect: Mood normal.     ED Results / Procedures / Treatments  Labs (all labs ordered are listed, but only abnormal results are displayed) Labs Reviewed  CBC - Abnormal; Notable for the following components:      Result Value   WBC 10.8 (*)    RBC 3.86 (*)    Hemoglobin 11.4 (*)    HCT 35.5 (*)    Platelets 404 (*)    All other components within normal limits  BASIC METABOLIC PANEL - Abnormal; Notable for the following components:   Potassium 3.3 (*)    BUN 7 (*)    All other components within normal limits  BRAIN NATRIURETIC PEPTIDE - Abnormal; Notable for the following components:   B Natriuretic Peptide 124.3 (*)    All other components within normal limits    EKG None  Radiology DG Chest Portable 1 View  Result Date: 12/20/2019 CLINICAL DATA:  69 year old female with lower extremity swelling. EXAM: PORTABLE CHEST 1 VIEW COMPARISON:  Chest radiographs 12/11/2019 and earlier. FINDINGS: Portable AP semi upright view at 0211 hours. Improved left lung base ventilation from earlier this month with decreased or resolved small left pleural effusion. No pulmonary edema. Stable upper lobe scarring. Stable cardiac size and mediastinal contours. Prior CABG. Visualized tracheal air column is within normal limits. No acute osseous abnormality identified. Negative visible bowel gas pattern. IMPRESSION: Decreased or resolved left pleural effusion since 12/11/2019. No acute  cardiopulmonary abnormality. Electronically Signed   By: Genevie Ann M.D.   On: 12/20/2019 02:18    Procedures Procedures (including critical care time)  Medications Ordered in ED Medications  HYDROcodone-acetaminophen (NORCO/VICODIN) 5-325 MG per tablet 1 tablet (1 tablet Oral Given 12/20/19 0300)  predniSONE (DELTASONE) tablet 60 mg (60 mg Oral Given 12/20/19 0300)    ED Course  I have reviewed the triage vital signs and the nursing notes.  Pertinent labs & imaging results that were available during my care of the patient were reviewed by me and considered in my medical decision making (see chart for details).    MDM Rules/Calculators/A&P                       Patient presents with left ankle swelling and pain.  On exam she has bilateral ankle swelling but left greater than right with pain associated with the left ankle.  Suspect gout.  She denies any chest pain or shortness of breath.  Low suspicion for blood clot as she is on treatment dose Eliquis.  Labs reviewed and largely reassuring.  BNP is not significantly elevated and chest x-ray without volume overload and actually has a resolving pleural effusion.  Patient was given Norco and a dose of prednisone.  Gave the patient the option of transitioning to a Medrol Dosepak with Percocet for breakthrough pain for presumed gout.  This time have low suspicion for septic arthritis as patient is afebrile and has no overlying skin changes suggestive of infection.  Patient is agreeable to plan  After history, exam, and medical workup I feel the patient has been appropriately medically screened and is safe for discharge home. Pertinent diagnoses were discussed with the patient. Patient was given return precautions.   Final Clinical Impression(s) / ED Diagnoses Final diagnoses:  Left ankle swelling  Acute gout of left ankle, unspecified cause    Rx / DC Orders ED Discharge Orders         Ordered    oxyCODONE-acetaminophen (PERCOCET/ROXICET)  5-325 MG tablet  Every 6 hours PRN     12/20/19 0323  methylPREDNISolone (MEDROL DOSEPAK) 4 MG TBPK tablet     12/20/19 0323           Merryl Hacker, MD 12/20/19 [phone]

## 2019-12-24 NOTE — Progress Notes (Signed)
Virtual Visit via Video Note   This visit type was conducted due to national recommendations for restrictions regarding the COVID-19 Pandemic (e.g. social distancing) in an effort to limit this patient's exposure and mitigate transmission in our community.  Due to her co-morbid illnesses, this patient is at least at moderate risk for complications without adequate follow up.  This format is felt to be most appropriate for this patient at this time.  All issues noted in this document were discussed and addressed.  A limited physical exam was performed with this format.  Please refer to the patient's chart for her consent to telehealth for [name].   The patient was identified using 2 identifiers.  Date:  12/25/2019   ID:  Miranda [name], DOB Jul 18, 1951, MRN [id number]  Patient Location: Home Provider Location: Office  PCP:  Boyce Medici, FNP  Cardiologist:  Lauree [name], MD   Electrophysiologist:  None   Evaluation Performed:  Follow-Up Visit  Chief Complaint: Posthospitalization follow-up; s/p NSTEMI, CABG  Patient Profile:    Miranda [name] is a 69 y.o. female with    Coronary artery disease   S/p NSTEMI >> s/p CABG in 11/2019  Pulmonary embolism (Dx during admx for MI in 11/2019)  Carotid Artery Dz  R 1-39, L 60-79 (11/2019)  Peripheral Arterial Disease    ABIs 11/2019: R 1.01; L 0.93  Hypertension   Hyperlipidemia   COPD  Gout  Prior Cardiac Studies  Pre-CABG Dopplers [id number] RICA [id number]; LICA [id number] ABIs R A999333; L 0.93  Cardiac catheterization 11/27/2019 LM mid 60 LAD ost 95, prox 80 LCx ost 95, mid 99 RCA prox 80  Echocardiogram 11/26/2019 EF 50-55, post HK, Gr 1 DD, normal RVSF, RVSP 31.9, mild MR  History of Present Illness: The patient was admitted 2/13-2/22 with a NSTEMI in the setting of multi-focal pneumonia and acute RLL pulmonary embolism.  Cardiac catheterization demonstrated 3 vessel coronary artery disease and she underwent  CABG with Dr. Julien Girt (L-LAD, RIMA-RPL, S-OM, S-Dx).   Post op course was uneventful.  She was DC on Apixaban for anticoagulation.    She was seen in the emergency room recently with left ankle pain.  She was diagnosed with gout and placed on prednisone.  She notes today that her left ankle pain and swelling is somewhat better.  However, she still has swelling after prolonged standing.  She has some mild swelling in her right leg which was used for vein harvesting for bypass.  This is limited her mobility some.  She has not had shortness of breath, orthopnea, syncope.  Her chest is still somewhat sore since her surgery but this is improving.  Past Medical History:  Diagnosis Date  . Carotid artery disease    Carotid US 11/2019: R 1-39; L 60-79  . Chronic obstructive pulmonary disease   . Coronary artery disease    s/p NSTEMI 11/2019 >> s/p CABG // Echo 2.14.2021: EF 50-55, Gr 1 DD, RVSP 31.9  . Gout   . History of pulmonary embolism    Pt notes episode in her 45s // Dx with RLL PE at time of NSTEMI in 11/2019 >> Apixaban  . HTN (hypertension)   . Hypertension   . Pneumonia 11/2019   Past Surgical History:  Procedure Laterality Date  . ABDOMINAL HYSTERECTOMY    . CORONARY ARTERY BYPASS GRAFT N/A 11/29/2019   Procedure: CORONARY ARTERY BYPASS GRAFTING (CABG) using the LIMA to LAD; RIMA to PL; Endoscopic harvested right greater saphenous  vein: SVC to Diag; SVC to OM1.;  Surgeon: Wonda Olds, MD;  Location: [facility];  Service: Open Heart Surgery;  Laterality: N/A;  bilateral IMA  . ENDOVEIN HARVEST OF GREATER SAPHENOUS VEIN Right 11/29/2019   Procedure: [place]opes Of Greater Saphenous Vein;  Surgeon: Wonda Olds, MD;  Location: [facility];  Service: Open Heart Surgery;  Laterality: Right;  . LEFT HEART CATH AND CORONARY ANGIOGRAPHY N/A 11/27/2019   Procedure: LEFT HEART CATH AND CORONARY ANGIOGRAPHY;  Surgeon: Martinique, Peter M, MD;  Location: [facility];  Service: Cardiovascular;   Laterality: N/A;  . TEE WITHOUT CARDIOVERSION N/A 11/29/2019   Procedure: TRANSESOPHAGEAL ECHOCARDIOGRAM (TEE);  Surgeon: Wonda Olds, MD;  Location: [facility];  Service: Open Heart Surgery;  Laterality: N/A;     Current Meds  Medication Sig  . albuterol (VENTOLIN HFA) 108 (90 Base) MCG/ACT inhaler Inhale 2 puffs into the lungs every 4 (four) hours as needed for wheezing or shortness of breath.   Marland Kitchen apixaban (ELIQUIS) 5 MG TABS tablet Take 1 tablet (5 mg total) by mouth 2 (two) times daily.  Marland Kitchen ascorbic acid (VITAMIN C) 500 MG tablet Take 500 mg by mouth daily.  . clopidogrel (PLAVIX) 75 MG tablet Take 1 tablet (75 mg total) by mouth daily.  . Fluticasone-Salmeterol (ADVAIR) 100-50 MCG/DOSE AEPB Inhale 1 puff into the lungs daily as needed (SOB).   . metoprolol tartrate (LOPRESSOR) 25 MG tablet Take 1 tablet (25 mg total) by mouth 2 (two) times daily.  Marland Kitchen oxyCODONE-acetaminophen (PERCOCET/ROXICET) 5-325 MG tablet Take 1 tablet by mouth every 6 (six) hours as needed for severe pain.  . rosuvastatin (CRESTOR) 20 MG tablet Take 20 mg by mouth daily.   . vitamin E (VITAMIN E) 180 MG (400 UNITS) capsule Take 400 Units by mouth daily.     Allergies:   Patient has no known allergies.   Social History   Tobacco Use  . Smoking status: Former Smoker    Years: 15.00  . Smokeless tobacco: Never Used  Substance Use Topics  . Alcohol use: Yes    Alcohol/week: 0.0 standard drinks    Comment: ocaasional  . Drug use: Never     Family Hx: The patient's family history includes Coronary artery disease in her mother.  ROS:   Please see the history of present illness.    She has not had any melena, hematochezia, hematuria.    Labs/Other Tests and Data Reviewed:    EKG:  No ECG reviewed.  Recent Labs: 11/28/2019: ALT 34 11/30/2019: Magnesium 2.2 12/19/2019: BUN 7; Creatinine, Ser 0.76; Hemoglobin 11.4; Platelets 404; Potassium 3.3; Sodium 140 12/20/2019: B Natriuretic Peptide 124.3   Recent  Lipid Panel Lab Results  Component Value Date/Time   CHOL 165 11/26/2019 06:07 AM   TRIG 59 11/26/2019 06:07 AM   HDL 39 (L) 11/26/2019 06:07 AM   CHOLHDL 4.2 11/26/2019 06:07 AM   LDLCALC 114 (H) 11/26/2019 06:07 AM    Wt Readings from Last 3 Encounters:  12/25/19 174 lb (78.9 kg)  12/19/19 174 lb (78.9 kg)  12/11/19 174 lb (78.9 kg)     Objective:    Vital Signs:  Ht 5\' 8"  (1.727 m)   Wt 174 lb (78.9 kg)   BMI 26.46 kg/m    VITAL SIGNS:  reviewed GEN:  no acute distress RESPIRATORY:  Normal respiratory effort NEURO:  alert and oriented x 3, no obvious focal deficit PSYCH:  normal affect  ASSESSMENT & PLAN:  1. Coronary artery disease involving native coronary artery of native heart without angina pectoris She is status post NSTEMI in February 2021 followed by CABG.  She seems to be recovering well and is not having any symptoms to suggest angina at this time.  We discussed the importance of antiplatelet therapy for the next year due to her recent non-ST elevation myocardial infarction.  She is on a single antiplatelet agent due to concomitant use of Apixaban.  Continue clopidogrel 75 mg daily, metoprolol 25 mg twice daily and rosuvastatin 20 mg daily.  Follow-up with Dr. Angelena Form in the next 2 to 3 months.  I will refer her to cardiac rehab.  2. Essential hypertension She does not have a blood pressure cuff.  We will try to provide her with one.  She was previously on Micardis HCT and amlodipine.  I have asked her to monitor blood pressure over the next couple of weeks and send me those readings for review.  If her blood pressure is above target, I will place her back on telmisartan.  I advised her that it would be best to hold off on adding a diuretic until the issues regarding gout in her ankle are resolved.  Arrange follow-up CMET.  3. Mixed hyperlipidemia Continue high intensity statin therapy with rosuvastatin 20 mg daily.  Arrange fasting lipids, CMET in 6 to 8  weeks.  4. History of pulmonary embolism As noted, she was recently admitted with pneumonia, right lower lobe pulmonary embolism and non-ST elevation myocardial infarction.  Her pulmonary embolism seems to be unprovoked.  She also notes a prior history of pulmonary embolism in her 23s.  Therefore, given 2 episodes of pulmonary emboli, she would likely benefit from long-term anticoagulation.  She will need to remain on Apixaban.  5. Bilateral carotid artery stenosis Her carotid Dopplers prior to surgery demonstrated 60-79% left ICA stenosis.  Continue clopidogrel and rosuvastatin.  Arrange follow-up carotid US in 6 months.    Time:   Today, I have spent 21 minutes with the patient with telehealth technology discussing the above problems.     Medication Adjustments/Labs and Tests Ordered: Current medicines are reviewed at length with the patient today.  Concerns regarding medicines are outlined above.   Tests Ordered: Orders Placed This Encounter  Procedures  . Comprehensive metabolic panel  . Lipid panel  . AMB referral to cardiac rehabilitation  . VAS US CAROTID    Medication Changes: No orders of the defined types were placed in this encounter.   Follow Up:  In Person in 3 month(s)  Signed, Richardson Dopp, PA-C  12/25/2019 11:56 AM    [place]

## 2019-12-25 ENCOUNTER — Encounter: Payer: Self-pay | Admitting: Physician Assistant

## 2019-12-25 ENCOUNTER — Other Ambulatory Visit: Payer: Self-pay

## 2019-12-25 ENCOUNTER — Telehealth (INDEPENDENT_AMBULATORY_CARE_PROVIDER_SITE_OTHER): Payer: Medicare PPO | Admitting: Physician Assistant

## 2019-12-25 VITALS — Ht 68.0 in | Wt 174.0 lb

## 2019-12-25 DIAGNOSIS — E782 Mixed hyperlipidemia: Secondary | ICD-10-CM | POA: Diagnosis not present

## 2019-12-25 DIAGNOSIS — I2510 Atherosclerotic heart disease of native coronary artery without angina pectoris: Secondary | ICD-10-CM | POA: Diagnosis not present

## 2019-12-25 DIAGNOSIS — Z86711 Personal history of pulmonary embolism: Secondary | ICD-10-CM | POA: Diagnosis not present

## 2019-12-25 DIAGNOSIS — I6523 Occlusion and stenosis of bilateral carotid arteries: Secondary | ICD-10-CM

## 2019-12-25 DIAGNOSIS — I10 Essential (primary) hypertension: Secondary | ICD-10-CM

## 2019-12-25 NOTE — Patient Instructions (Addendum)
Medication Instructions:   Your physician recommends that you continue on your current medications as directed. Please refer to the Current Medication list given to you today.  *If you need a refill on your cardiac medications before your next appointment, please call your pharmacy*  Lab Work:  Your physician recommends that you return for lab work on 02/06/20 for CMET and fasting lipids  If you have labs (blood work) drawn today and your tests are completely normal, you will receive your results only by: Marland Kitchen MyChart Message (if you have MyChart) OR . A paper copy in the mail If you have any lab test that is abnormal or we need to change your treatment, we will call you to review the results.  Testing/Procedures:  Your physician has requested that you have a carotid duplex in 6 months. This test is an ultrasound of the carotid arteries in your neck. It looks at blood flow through these arteries that supply the brain with blood. Allow one hour for this exam. There are no restrictions or special instructions.  Follow-Up: At [facility], you and your health needs are our priority.  As part of our continuing mission to provide you with exceptional heart care, we have created designated Provider Care Teams.  These Care Teams include your primary Cardiologist (physician) and Advanced Practice Providers (APPs -  Physician Assistants and Nurse Practitioners) who all work together to provide you with the care you need, when you need it.  We recommend signing up for the patient portal called "MyChart".  Sign up information is provided on this After Visit Summary.  MyChart is used to connect with patients for Virtual Visits (Telemedicine).  Patients are able to view lab/test results, encounter notes, upcoming appointments, etc.  Non-urgent messages can be sent to your provider as well.   To learn more about what you can do with MyChart, go to [email or web address].    Your next appointment:    On  03/07/20 at 8:00AM with Lauree [name], MD   Other Instructions  Check your blood pressure 4-5 times a week and call in 1-2 weeks with those readings. Follow-up with your primary care provider or podiatrist for left ankle pain and swelling

## 2019-12-26 ENCOUNTER — Ambulatory Visit: Payer: Medicare PPO | Admitting: Physician Assistant

## 2019-12-27 ENCOUNTER — Inpatient Hospital Stay (HOSPITAL_COMMUNITY): Admission: RE | Admit: 2019-12-27 | Payer: Medicare PPO | Source: Ambulatory Visit

## 2019-12-28 ENCOUNTER — Other Ambulatory Visit: Payer: Self-pay | Admitting: Cardiothoracic Surgery

## 2019-12-28 DIAGNOSIS — Z951 Presence of aortocoronary bypass graft: Secondary | ICD-10-CM

## 2019-12-29 ENCOUNTER — Telehealth (HOSPITAL_COMMUNITY): Payer: Self-pay

## 2019-12-29 NOTE — Telephone Encounter (Signed)
Called pt to see if she was interested in the cardiac rehab program, pt seemed interested in the beginning and after I advised her of her $20 copay she declined the program. Advised pt if anything changes to call back. Closed referral

## 2020-01-01 ENCOUNTER — Ambulatory Visit
Admission: RE | Admit: 2020-01-01 | Discharge: 2020-01-01 | Disposition: A | Discharge status: Home / Self Care | Payer: Medicare PPO | Source: Ambulatory Visit | Attending: Cardiothoracic Surgery | Admitting: Cardiothoracic Surgery

## 2020-01-01 ENCOUNTER — Ambulatory Visit (INDEPENDENT_AMBULATORY_CARE_PROVIDER_SITE_OTHER): Payer: Self-pay | Admitting: Cardiothoracic Surgery

## 2020-01-01 ENCOUNTER — Other Ambulatory Visit: Payer: Self-pay

## 2020-01-01 VITALS — BP 163/88 | HR 64 | Temp 98.1°F | Resp 20 | Ht 68.0 in | Wt 171.5 lb

## 2020-01-01 DIAGNOSIS — Z736 Limitation of activities due to disability: Secondary | ICD-10-CM

## 2020-01-01 DIAGNOSIS — Z951 Presence of aortocoronary bypass graft: Secondary | ICD-10-CM

## 2020-01-01 NOTE — Progress Notes (Signed)
      [address]       [place] [address]             [phone]     CARDIOTHORACIC SURGERY OFFICE NOTE  Referring Provider is Odem, Coolidge Breeze, FNP Primary Cardiologist is Lauree [name], MD PCP is Odem, Coolidge Breeze, FNP   HPI:  69 yo lady presents for f/u after CABG. She had episode of gouty flare last visit; now resolved. Denies CP, SOB or palpitation. Is ready to return to work. Does note fluctuation in BP readings from day to day   Current Outpatient Medications  Medication Sig Dispense Refill  . albuterol (VENTOLIN HFA) 108 (90 Base) MCG/ACT inhaler Inhale 2 puffs into the lungs every 4 (four) hours as needed for wheezing or shortness of breath.     Marland Kitchen apixaban (ELIQUIS) 5 MG TABS tablet Take 1 tablet (5 mg total) by mouth 2 (two) times daily. 60 tablet 2  . ascorbic acid (VITAMIN C) 500 MG tablet Take 500 mg by mouth daily.    . clopidogrel (PLAVIX) 75 MG tablet Take 1 tablet (75 mg total) by mouth daily. 30 tablet 6  . Fluticasone-Salmeterol (ADVAIR) 100-50 MCG/DOSE AEPB Inhale 1 puff into the lungs daily as needed (SOB).     . metoprolol tartrate (LOPRESSOR) 25 MG tablet Take 1 tablet (25 mg total) by mouth 2 (two) times daily. 60 tablet 2  . oxyCODONE-acetaminophen (PERCOCET/ROXICET) 5-325 MG tablet Take 1 tablet by mouth every 6 (six) hours as needed for severe pain. 15 tablet 0  . rosuvastatin (CRESTOR) 20 MG tablet Take 20 mg by mouth daily.     . vitamin E (VITAMIN E) 180 MG (400 UNITS) capsule Take 400 Units by mouth daily.     No current facility-administered medications for this visit.      Physical Exam:   BP (!) 163/88 (BP Location: Left Arm, Patient Position: Sitting, Cuff Size: Normal)   Pulse 64   Temp 98.1 F (36.7 C) (Temporal)   Resp 20   Ht 5\' 8"  (1.727 m)   Wt 77.8 kg   SpO2 100% Comment: RA  BMI 26.08 kg/m   General:  Well-appearing, NAD  Chest:   cta  CV:   rrr  Incisions:  Healing  well  Abdomen:  sntnd  Extremities:  No edema; soreness in left ankle resolved  Diagnostic Tests:  CXR with clear lung fields   Impression:  Doing well after CABG  Plan:  F/u as needed Ok to drive Ok to return to work  Resume full activities as tolerated Keep log of BP so that this can be addressed with cardiology/primary care in near future.  I spent in excess of 15 minutes during the conduct of this office consultation and >50% of this time involved direct face-to-face encounter with the patient for counseling and/or coordination of their care.  Level 2                 10 minutes Level 3                 15 minutes Level 4                 25 minutes Level 5                 40 minutes  B. Murvin Natal, MD 01/01/2020 12:00 PM

## 2020-01-04 ENCOUNTER — Ambulatory Visit: Payer: Medicare PPO | Attending: Internal Medicine

## 2020-01-04 DIAGNOSIS — Z23 Encounter for immunization: Secondary | ICD-10-CM

## 2020-01-04 NOTE — Progress Notes (Signed)
   Covid-19 Vaccination Clinic  Name:  Miranda [name]    MRN: [id number] DOB: June 27, 1951  01/04/2020  Miranda [name] was observed post Covid-19 immunization for 15 minutes without incident. She was provided with Vaccine Information Sheet and instruction to access the V-Safe system.   Miranda [name] was instructed to call 911 with any severe reactions post vaccine: Marland Kitchen Difficulty breathing  . Swelling of face and throat  . A fast heartbeat  . A bad rash all over body  . Dizziness and weakness   Immunizations Administered    Name Date Dose VIS Date Route   Pfizer COVID-19 Vaccine 01/04/2020  9:02 AM 0.3 mL 09/22/2019 Intramuscular   Manufacturer: [place]   Lot: [id number]   [place]: [id number]

## 2020-01-30 ENCOUNTER — Ambulatory Visit: Payer: Medicare PPO | Attending: Internal Medicine

## 2020-01-30 DIAGNOSIS — Z23 Encounter for immunization: Secondary | ICD-10-CM

## 2020-01-30 NOTE — Progress Notes (Signed)
   Covid-19 Vaccination Clinic  Name:  Miranda [name]    MRN: [id number] DOB: [birth year]  01/30/2020  [name] was observed post Covid-19 immunization for 15 minutes without incident. She was provided with Vaccine Information Sheet and instruction to access the V-Safe system.   [name] was instructed to call 911 with any severe reactions post vaccine: Marland Kitchen Difficulty breathing  . Swelling of face and throat  . A fast heartbeat  . A bad rash all over body  . Dizziness and weakness   Immunizations Administered    Name Date Dose VIS Date Route   Pfizer COVID-19 Vaccine 01/30/2020  7:59 AM 0.3 mL 12/06/2018 Intramuscular   Manufacturer: [place]   Lot: [id number]   [place]: [id number]

## 2020-02-06 ENCOUNTER — Other Ambulatory Visit: Payer: Medicare PPO

## 2020-02-06 ENCOUNTER — Other Ambulatory Visit: Payer: Self-pay

## 2020-02-06 DIAGNOSIS — I2510 Atherosclerotic heart disease of native coronary artery without angina pectoris: Secondary | ICD-10-CM

## 2020-02-06 DIAGNOSIS — E782 Mixed hyperlipidemia: Secondary | ICD-10-CM

## 2020-02-06 DIAGNOSIS — I10 Essential (primary) hypertension: Secondary | ICD-10-CM

## 2020-02-06 LAB — COMPREHENSIVE METABOLIC PANEL
ALT: 16 IU/L (ref 0–32)
AST: 21 IU/L (ref 0–40)
Albumin/Globulin Ratio: 1.4 (ref 1.2–2.2)
Albumin: 4.2 g/dL (ref 3.8–4.8)
Alkaline Phosphatase: 88 IU/L (ref 39–117)
BUN/Creatinine Ratio: 18 (ref 12–28)
BUN: 16 mg/dL (ref 8–27)
Bilirubin Total: 0.3 mg/dL (ref 0.0–1.2)
CO2: 24 mmol/L (ref 20–29)
Calcium: 9.5 mg/dL (ref 8.7–10.3)
Chloride: 105 mmol/L (ref 96–106)
Creatinine, Ser: 0.89 mg/dL (ref 0.57–1.00)
GFR calc Af Amer: 77 mL/min/{1.73_m2} (ref >59)
GFR calc non Af Amer: 67 mL/min/{1.73_m2} (ref >59)
Globulin, Total: 3.1 g/dL (ref 1.5–4.5)
Glucose: 114 mg/dL — ABNORMAL HIGH (ref 65–99)
Potassium: 4.7 mmol/L (ref 3.5–5.2)
Sodium: 142 mmol/L (ref 134–144)
Total Protein: 7.3 g/dL (ref 6.0–8.5)

## 2020-02-06 LAB — LIPID PANEL
Chol/HDL Ratio: 2.2 ratio (ref 0.0–4.4)
Cholesterol, Total: 162 mg/dL (ref 100–199)
HDL: 73 mg/dL (ref >39)
LDL Chol Calc (NIH): 75 mg/dL (ref 0–99)
Triglycerides: 75 mg/dL (ref 0–149)
VLDL Cholesterol Cal: 14 mg/dL (ref 5–40)

## 2020-02-08 ENCOUNTER — Telehealth: Payer: Self-pay | Admitting: Physician Assistant

## 2020-02-08 DIAGNOSIS — I2510 Atherosclerotic heart disease of native coronary artery without angina pectoris: Secondary | ICD-10-CM

## 2020-02-08 DIAGNOSIS — I6523 Occlusion and stenosis of bilateral carotid arteries: Secondary | ICD-10-CM

## 2020-02-08 DIAGNOSIS — E782 Mixed hyperlipidemia: Secondary | ICD-10-CM

## 2020-02-08 NOTE — Telephone Encounter (Signed)
° °  Pt is returning call regarding Lab result  Please call

## 2020-02-08 NOTE — Telephone Encounter (Signed)
I called and spoke with patient, she states that she was originally on Rosuvastatin 10 mg before surgery in February. Patient states that she was still taking 10 mg until a few weeks ago, she did not have the 20 mg consistently. Do you still want to increase her to 40 mg? Or have her on 20 mg consistently and come back for recheck?

## 2020-02-08 NOTE — Telephone Encounter (Signed)
Take Rosuvastatin 20 mg once daily  Fasting Lipids and LFTs in 3 mos Richardson Dopp, [place]    02/08/2020 5:26 PM

## 2020-02-09 ENCOUNTER — Other Ambulatory Visit: Payer: Self-pay

## 2020-02-09 MED ORDER — ROSUVASTATIN CALCIUM 20 MG PO TABS: 20.0000 mg | ORAL_TABLET | Freq: Every day | ORAL | 1 refills | Status: DC

## 2020-02-09 NOTE — Telephone Encounter (Signed)
I called and spoke with patient, she will take Rosuvastatin 20 mg once a day, prescription sent to pharmacy. Patient will come back on 05/14/20 for repeat labs. Orders in for labs.

## 2020-02-29 ENCOUNTER — Other Ambulatory Visit: Payer: Self-pay | Admitting: Physician Assistant

## 2020-03-07 ENCOUNTER — Other Ambulatory Visit: Payer: Self-pay

## 2020-03-07 ENCOUNTER — Encounter: Payer: Self-pay | Admitting: Cardiovascular Disease

## 2020-03-07 ENCOUNTER — Ambulatory Visit: Payer: Medicare PPO | Admitting: Cardiovascular Disease

## 2020-03-07 VITALS — BP 138/64 | HR 56 | Ht 68.0 in | Wt 180.2 lb

## 2020-03-07 DIAGNOSIS — Z86711 Personal history of pulmonary embolism: Secondary | ICD-10-CM

## 2020-03-07 DIAGNOSIS — I10 Essential (primary) hypertension: Secondary | ICD-10-CM

## 2020-03-07 DIAGNOSIS — E782 Mixed hyperlipidemia: Secondary | ICD-10-CM

## 2020-03-07 DIAGNOSIS — I6523 Occlusion and stenosis of bilateral carotid arteries: Secondary | ICD-10-CM | POA: Diagnosis not present

## 2020-03-07 DIAGNOSIS — I5032 Chronic diastolic (congestive) heart failure: Secondary | ICD-10-CM

## 2020-03-07 DIAGNOSIS — I2510 Atherosclerotic heart disease of native coronary artery without angina pectoris: Secondary | ICD-10-CM

## 2020-03-07 LAB — LIPID PANEL
Chol/HDL Ratio: 2.5 ratio (ref 0.0–4.4)
Cholesterol, Total: 190 mg/dL (ref 100–199)
HDL: 77 mg/dL (ref >39)
LDL Chol Calc (NIH): 91 mg/dL (ref 0–99)
Triglycerides: 128 mg/dL (ref 0–149)
VLDL Cholesterol Cal: 22 mg/dL (ref 5–40)

## 2020-03-07 LAB — HEPATIC FUNCTION PANEL
ALT: 13 IU/L (ref 0–32)
AST: 25 IU/L (ref 0–40)
Albumin: 4.5 g/dL (ref 3.8–4.8)
Alkaline Phosphatase: 85 IU/L (ref 48–121)
Bilirubin Total: 0.2 mg/dL (ref 0.0–1.2)
Bilirubin, Direct: 0.1 mg/dL (ref 0.00–0.40)
Total Protein: 7.7 g/dL (ref 6.0–8.5)

## 2020-03-07 MED ORDER — ASPIRIN EC 81 MG PO TBEC
81.0000 mg | DELAYED_RELEASE_TABLET | Freq: Every day | ORAL | 3 refills | Status: DC
Start: 2020-03-07 — End: 2024-04-09

## 2020-03-07 MED ORDER — FUROSEMIDE 20 MG PO TABS
20.0000 mg | ORAL_TABLET | ORAL | 6 refills | Status: DC | PRN
Start: 2020-03-07 — End: 2024-02-23

## 2020-03-07 NOTE — Progress Notes (Signed)
Chief Complaint  Patient presents with  . Follow-up    CAD   History of Present Illness: 69 yo Miranda [name] with history of CAD s/p CABG, PE, carotid artery disease, PAD, HLD, HTN, COPD and gout here today for cardiac follow up. She was admitted to [facility] February 2021 with a NSTEMI in the setting of pneumonia and acute PE. Cardiac cath with three vessel CAD. She underwent 4V CABG February 2021. Echo 11/26/19 with LVEF=50-55%. Mild MR.   She is here today for follow up. The patient denies any chest pain, dyspnea, palpitations, lower extremity edema, orthopnea, PND, dizziness, near syncope or syncope. She has mild left ankle edema.   Primary Care Physician: Boyce Medici, FNP   Past Medical History:  Diagnosis Date  . Carotid artery disease    Carotid US 11/2019: R 1-39; L 60-79  . Chronic obstructive pulmonary disease   . Coronary artery disease    s/p NSTEMI 11/2019 >> s/p CABG // Echo 2.14.2021: EF 50-55, Gr 1 DD, RVSP 31.9  . Gout   . History of pulmonary embolism    Pt notes episode in her 64s // Dx with RLL PE at time of NSTEMI in 11/2019 >> Apixaban  . HTN (hypertension)   . Hypertension   . Pneumonia 11/2019    Past Surgical History:  Procedure Laterality Date  . ABDOMINAL HYSTERECTOMY    . CORONARY ARTERY BYPASS GRAFT N/A 11/29/2019   Procedure: CORONARY ARTERY BYPASS GRAFTING (CABG) using the LIMA to LAD; RIMA to PL; Endoscopic harvested right greater saphenous vein: SVC to Diag; SVC to OM1.;  Surgeon: Wonda Olds, MD;  Location: [place];  Service: Open Heart Surgery;  Laterality: N/A;  bilateral IMA  . ENDOVEIN HARVEST OF GREATER SAPHENOUS VEIN Right 11/29/2019   Procedure: [place] Of Greater Saphenous Vein;  Surgeon: Wonda Olds, MD;  Location: [place];  Service: Open Heart Surgery;  Laterality: Right;  . LEFT HEART CATH AND CORONARY ANGIOGRAPHY N/A 11/27/2019   Procedure: LEFT HEART CATH AND CORONARY ANGIOGRAPHY;  Surgeon: Martinique, Peter M, MD;  Location: [place] [facility];  Service: Cardiovascular;  Laterality: N/A;  . TEE WITHOUT CARDIOVERSION N/A 11/29/2019   Procedure: TRANSESOPHAGEAL ECHOCARDIOGRAM (TEE);  Surgeon: Wonda Olds, MD;  Location: [place];  Service: Open Heart Surgery;  Laterality: N/A;    Current Outpatient Medications  Medication Sig Dispense Refill  . albuterol (VENTOLIN HFA) 108 (90 Base) MCG/ACT inhaler Inhale 2 puffs into the lungs every 4 (four) hours as needed for wheezing or shortness of breath.     Marland Kitchen apixaban (ELIQUIS) 5 MG TABS tablet Take 1 tablet (5 mg total) by mouth 2 (two) times daily. 60 tablet 2  . ascorbic acid (VITAMIN C) 500 MG tablet Take 500 mg by mouth daily.    . Fluticasone-Salmeterol (ADVAIR) 100-50 MCG/DOSE AEPB Inhale 1 puff into the lungs daily as needed (SOB).     . metoprolol tartrate (LOPRESSOR) 25 MG tablet Take 1 tablet (25 mg total) by mouth 2 (two) times daily. 60 tablet 2  . rosuvastatin (CRESTOR) 20 MG tablet Take 1 tablet (20 mg total) by mouth daily. 90 tablet 1  . vitamin E (VITAMIN E) 180 MG (400 UNITS) capsule Take 400 Units by mouth daily.    Marland Kitchen aspirin EC 81 MG tablet Take 1 tablet (81 mg total) by mouth daily. 90 tablet 3  . furosemide (LASIX) 20 MG tablet Take 1 tablet (20 mg total) by mouth as needed (  for swelling). 30 tablet 6   No current facility-administered medications for this visit.    No Known Allergies  Social History   Socioeconomic History  . Marital status: Single    Spouse name: Not on file  . Number of children: Not on file  . Years of education: Not on file  . Highest education level: Not on file  Occupational History  . Not on file  Tobacco Use  . Smoking status: Former Smoker    Years: 15.00  . Smokeless tobacco: Never Used  Substance and Sexual Activity  . Alcohol use: Yes    Alcohol/week: 0.0 standard drinks    Comment: ocaasional  . Drug use: Never  . Sexual activity: Not on file  Other Topics Concern  . Not on file  Social History  Narrative  . Not on file   Social Determinants of Health   Financial Resource Strain:   . Difficulty of Paying Living Expenses:   Food Insecurity:   . Worried About Charity fundraiser in the Last Year:   . Arboriculturist in the Last Year:   Transportation Needs:   . Film/video editor (Medical):   Marland Kitchen Lack of Transportation (Non-Medical):   Physical Activity:   . Days of Exercise per Week:   . Minutes of Exercise per Session:   Stress:   . Feeling of Stress :   Social Connections:   . Frequency of Communication with Friends and Family:   . Frequency of Social Gatherings with Friends and Family:   . Attends Religious Services:   . Active Member of Clubs or Organizations:   . Attends Archivist Meetings:   Marland Kitchen Marital Status:   Intimate Partner Violence:   . Fear of Current or Ex-Partner:   . Emotionally Abused:   Marland Kitchen Physically Abused:   . Sexually Abused:     Family History  Problem Relation Age of Onset  . Coronary artery disease Mother     Review of Systems:  As stated in the HPI and otherwise negative.   BP 138/64   Pulse (!) 56   Ht 5\' 8"  (1.727 m)   Wt 180 lb 3.2 oz (81.7 kg)   SpO2 99%   BMI 27.40 kg/m   Physical Examination: General: Well developed, well nourished, NAD  HEENT: OP clear, mucus membranes moist  SKIN: warm, dry. No rashes. Neuro: No focal deficits  Musculoskeletal: Muscle strength 5/5 all ext  Psychiatric: Mood and affect normal  Neck: No JVD, no carotid bruits, no thyromegaly, no lymphadenopathy.  Lungs:Clear bilaterally, no wheezes, rhonci, crackles Cardiovascular: Regular rate and rhythm. No murmurs, gallops or rubs. Abdomen:Soft. Bowel sounds present. Non-tender.  Extremities: Trace LLE edema.   EKG:  EKG is not ordered today. The ekg ordered today demonstrates   Recent Labs: 11/30/2019: Magnesium 2.2 12/19/2019: Hemoglobin 11.4; Platelets 404 12/20/2019: B Natriuretic Peptide 124.3 02/06/2020: ALT 16; BUN 16;  Creatinine, Ser 0.89; Potassium 4.7; Sodium 142   Lipid Panel    Component Value Date/Time   CHOL 162 02/06/2020 0807   TRIG 75 02/06/2020 0807   HDL 73 02/06/2020 0807   CHOLHDL 2.2 02/06/2020 0807   CHOLHDL 4.2 11/26/2019 0607   VLDL 12 11/26/2019 0607   LDLCALC 75 02/06/2020 0807     Wt Readings from Last 3 Encounters:  03/07/20 180 lb 3.2 oz (81.7 kg)  01/01/20 171 lb 8 oz (77.8 kg)  12/25/19 174 lb (78.9 kg)    Assessment and  Plan:   1. CAD s/p CABG without angina: No chest pain. Will continue statin and beta blocker. Stop Plavix and start ASA 81 mg daily.   2. HTN: BP controlled. Continue current therapy  3. HLD: LDL near goal. Continue statin. Check lipids and LFTs today  4. History of PE: Continue Eliquis.   5. Carotid artery disease: Moderate disease in February 2021. Repeat dopplers August 2021.   6. Chronic diastolic CHF: Lasix 20 mg po prn.   Current medicines are reviewed at length with the patient today.  The patient does not have concerns regarding medicines.  The following changes have been made:  no change  Labs/ tests ordered today include:   Orders Placed This Encounter  Procedures  . Lipid panel  . Hepatic function panel  . VAS US CAROTID     Disposition:   FU with me in 6 months.    Signed, Lauree [name], MD 03/07/2020 8:46 AM    [facility] [place], [place],   [address] Phone: [phone]; Fax: [phone]

## 2020-03-07 NOTE — Patient Instructions (Signed)
Medication Instructions:  Your physician has recommended you make the following change in your medication:  1.) stop clopidogrel (Plavix) 2.) start aspirin 81 mg daily 3.) start furosemide (Lasix) 20 mg - take as needed for swelling  *If you need a refill on your cardiac medications before your next appointment, please call your pharmacy*   Lab Work: Today: lipids/liver function panel  If you have labs (blood work) drawn today and your tests are completely normal, you will receive your results only by: Marland Kitchen MyChart Message (if you have MyChart) OR . A paper copy in the mail If you have any lab test that is abnormal or we need to change your treatment, we will call you to review the results.   Testing/Procedures:  DUE IN AUGUST 2021 Your physician has requested that you have a carotid duplex. This test is an ultrasound of the carotid arteries in your neck. It looks at blood flow through these arteries that supply the brain with blood. Allow one hour for this exam. There are no restrictions or special instructions.   Follow-Up: At [facility], you and your health needs are our priority.  As part of our continuing mission to provide you with exceptional heart care, we have created designated Provider Care Teams.  These Care Teams include your primary Cardiologist (physician) and Advanced Practice Providers (APPs -  Physician Assistants and Nurse Practitioners) who all work together to provide you with the care you need, when you need it.   Your next appointment:   6 month(s)  The format for your next appointment:   In Person  Provider:   You may see Lauree [name], MD or one of the following Advanced Practice Providers on your designated Care Team:    Melina Copa, PA-C  Ermalinda Barrios, PA-C    Other Instructions

## 2020-03-08 ENCOUNTER — Other Ambulatory Visit: Payer: Self-pay | Admitting: Physician Assistant

## 2020-03-08 ENCOUNTER — Telehealth: Payer: Self-pay

## 2020-03-08 ENCOUNTER — Other Ambulatory Visit: Payer: Self-pay

## 2020-03-08 MED ORDER — ROSUVASTATIN CALCIUM 20 MG PO TABS: 40.0000 mg | ORAL_TABLET | Freq: Every day | ORAL | 1 refills | Status: DC

## 2020-03-08 NOTE — Telephone Encounter (Signed)
The patient has been notified of the result and verbalized understanding.  All questions (if any) were answered. Wilma Flavin, RN 03/08/2020 10:14 AM

## 2020-03-08 NOTE — Telephone Encounter (Signed)
-----   Message from Burnell Blanks, MD sent at 03/07/2020  8:34 PM EDT ----- LFTS are normal. Her LDL is not at the goal of 70 post NSTEMI in February. I would recommend that she increase her Crestor to 40 mg daily and repeat lipids and LFTS in 12 weeks. Gerald Stabs

## 2020-05-14 ENCOUNTER — Other Ambulatory Visit: Payer: Self-pay

## 2020-05-14 ENCOUNTER — Other Ambulatory Visit: Payer: Self-pay | Admitting: Cardiovascular Disease

## 2020-05-14 ENCOUNTER — Other Ambulatory Visit: Payer: Medicare PPO

## 2020-05-14 DIAGNOSIS — I2510 Atherosclerotic heart disease of native coronary artery without angina pectoris: Secondary | ICD-10-CM

## 2020-05-14 DIAGNOSIS — I6523 Occlusion and stenosis of bilateral carotid arteries: Secondary | ICD-10-CM

## 2020-05-14 DIAGNOSIS — E782 Mixed hyperlipidemia: Secondary | ICD-10-CM

## 2020-05-14 LAB — LIPID PANEL
Chol/HDL Ratio: 2.5 ratio (ref 0.0–4.4)
Cholesterol, Total: 187 mg/dL (ref 100–199)
HDL: 75 mg/dL (ref >39)
LDL Chol Calc (NIH): 90 mg/dL (ref 0–99)
Triglycerides: 125 mg/dL (ref 0–149)
VLDL Cholesterol Cal: 22 mg/dL (ref 5–40)

## 2020-05-14 LAB — HEPATIC FUNCTION PANEL
ALT: 14 IU/L (ref 0–32)
AST: 19 IU/L (ref 0–40)
Albumin: 4.5 g/dL (ref 3.8–4.8)
Alkaline Phosphatase: 93 IU/L (ref 48–121)
Bilirubin Total: 0.4 mg/dL (ref 0.0–1.2)
Bilirubin, Direct: 0.15 mg/dL (ref 0.00–0.40)
Total Protein: 7.6 g/dL (ref 6.0–8.5)

## 2020-05-15 ENCOUNTER — Other Ambulatory Visit: Payer: Self-pay

## 2020-05-15 ENCOUNTER — Telehealth: Payer: Self-pay

## 2020-05-15 MED ORDER — ROSUVASTATIN CALCIUM 40 MG PO TABS
40.0000 mg | ORAL_TABLET | Freq: Every day | ORAL | 1 refills | Status: DC
Start: 2020-05-15 — End: 2024-02-23

## 2020-05-15 NOTE — Telephone Encounter (Signed)
-----   Message from Liliane Shi, [place] sent at 05/15/2020  7:58 AM EDT ----- LFTs normal.  LDL still not at goal (goal <70). Chart indicates she is on rosuvastatin 40 mg daily PLAN:   -Continue rosuvastatin 40 mg daily  -Add ezetimibe 10 mg daily  -Lipids, LFTs 3 months Richardson Dopp, PA-C    05/15/2020 7:55 AM

## 2020-05-15 NOTE — Telephone Encounter (Signed)
The patient has been notified of the result and verbalized understanding. Patient states that she did not increase Rosuvastatin to 40 mg as directed in May, she is still on 20 mg. Advised patient to increase Rosuvastatin to 40 mg and will recheck cholesterol on 08/26/20 at appointment with Dr. Angelena Form. Advised patient not to start Zetia. Per Richardson Dopp, this is ok to hold off on Zetia and increase Rosuvastatin as originally planned. Prescription for Rosuvastatin 40 mg sent to pharmacy. Mady Haagensen, [place] 05/15/2020 9:21 AM

## 2020-05-23 ENCOUNTER — Ambulatory Visit (HOSPITAL_COMMUNITY)
Admission: RE | Admit: 2020-05-23 | Discharge: 2020-05-23 | Disposition: A | Discharge status: Home / Self Care | Payer: Medicare PPO | Source: Ambulatory Visit | Attending: Cardiology | Admitting: Cardiology

## 2020-05-23 ENCOUNTER — Other Ambulatory Visit: Payer: Self-pay

## 2020-05-23 ENCOUNTER — Other Ambulatory Visit (HOSPITAL_COMMUNITY): Payer: Self-pay | Admitting: Cardiovascular Disease

## 2020-05-23 DIAGNOSIS — I6523 Occlusion and stenosis of bilateral carotid arteries: Secondary | ICD-10-CM | POA: Diagnosis present

## 2020-05-27 ENCOUNTER — Telehealth: Payer: Self-pay

## 2020-05-27 NOTE — Telephone Encounter (Signed)
LMTCB with questions. Results released to [facility]

## 2020-05-27 NOTE — Telephone Encounter (Signed)
-----   Message from Burnell Blanks, MD sent at 05/26/2020  3:04 PM EDT ----- Moderate carotid artery disease, unchanged. Gerald Stabs

## 2020-05-27 NOTE — Telephone Encounter (Signed)
Attempted to call the patient back regarding her labwork. No answer. Left another message.

## 2020-05-27 NOTE — Telephone Encounter (Signed)
Patient is returning call to discuss results. 

## 2020-08-26 ENCOUNTER — Ambulatory Visit: Payer: Medicare PPO | Admitting: Cardiovascular Disease

## 2020-11-13 ENCOUNTER — Other Ambulatory Visit: Payer: Self-pay

## 2020-11-13 ENCOUNTER — Ambulatory Visit (HOSPITAL_COMMUNITY)
Admission: EM | Admit: 2020-11-13 | Discharge: 2020-11-13 | Disposition: A | Discharge status: Home / Self Care | Payer: Medicare PPO | Attending: Medical Oncology | Admitting: Medical Oncology

## 2020-11-13 ENCOUNTER — Ambulatory Visit (INDEPENDENT_AMBULATORY_CARE_PROVIDER_SITE_OTHER): Payer: Medicare PPO

## 2020-11-13 ENCOUNTER — Encounter (HOSPITAL_COMMUNITY): Payer: Self-pay | Admitting: Emergency Medicine

## 2020-11-13 DIAGNOSIS — M79644 Pain in right finger(s): Secondary | ICD-10-CM

## 2020-11-13 DIAGNOSIS — M7989 Other specified soft tissue disorders: Secondary | ICD-10-CM | POA: Diagnosis not present

## 2020-11-13 DIAGNOSIS — Z8739 Personal history of other diseases of the musculoskeletal system and connective tissue: Secondary | ICD-10-CM

## 2020-11-13 DIAGNOSIS — M109 Gout, unspecified: Secondary | ICD-10-CM | POA: Diagnosis not present

## 2020-11-13 MED ORDER — PREDNISONE 10 MG PO TABS
20.0000 mg | ORAL_TABLET | Freq: Every day | ORAL | 0 refills | Status: DC
Start: 2020-11-13 — End: 2024-02-23

## 2020-11-13 NOTE — ED Provider Notes (Signed)
[place]    CSN: [id number] Arrival date & time: 11/13/20  1019      History   Chief Complaint Chief Complaint  Patient presents with  . Hand Pain    Right 2nd     HPI Miranda [name] is a 70 y.o. female.   HPI   Right finger pain: Pt presents with a 2 week history of pain and swelling of the right 2nd finger. She does have a history of gout but this is the first time that she has had an episode of gout on her hands or fingers. Normally she gets a gout flare on her foot. She reports that her PCP switched her off of colchicine to Mitigate due to insurance coverage issues and this medication does not work for her flares. She has been using aleve which has helped but has not resolved the swelling or pain fully. Pain rated moderate to severe in nature. No history of injury to this area, fever, skin breakdown.   Past Medical History:  Diagnosis Date  . Carotid artery disease    Carotid US 11/2019: R 1-39; L 60-79  . Chronic obstructive pulmonary disease   . Coronary artery disease    s/p NSTEMI 11/2019 >> s/p CABG // Echo 2.14.2021: EF 50-55, Gr 1 DD, RVSP 31.9  . Gout   . History of pulmonary embolism    Pt notes episode in her 64s // Dx with RLL PE at time of NSTEMI in 11/2019 >> Apixaban  . HTN (hypertension)   . Hypertension   . Pneumonia 11/2019    Patient Active Problem List   Diagnosis Date Noted  . 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 NSTEMI (non-ST elevated myocardial infarction) ([place]) 11/26/2019  . Multifocal pneumonia 11/26/2019  . Hyperlipidemia 11/26/2019  . Anemia 11/26/2019  . COPD exacerbation ([place]) 08/02/2017  . History of gout 09/18/2016  . Hypertension 03/12/2016    Past Surgical History:  Procedure Laterality Date  . ABDOMINAL HYSTERECTOMY    . CORONARY ARTERY BYPASS GRAFT N/A 11/29/2019   Procedure: CORONARY ARTERY BYPASS GRAFTING (CABG) using the LIMA to LAD; RIMA to PL; Endoscopic harvested right greater saphenous vein: SVC to Diag; SVC to OM1.;   Surgeon: Wonda Olds, MD;  Location: [facility];  Service: Open Heart Surgery;  Laterality: N/A;  bilateral IMA  . ENDOVEIN HARVEST OF GREATER SAPHENOUS VEIN Right 11/29/2019   Procedure: [place] Of Greater Saphenous Vein;  Surgeon: Wonda Olds, MD;  Location: [facility];  Service: Open Heart Surgery;  Laterality: Right;  . LEFT HEART CATH AND CORONARY ANGIOGRAPHY N/A 11/27/2019   Procedure: LEFT HEART CATH AND CORONARY ANGIOGRAPHY;  Surgeon: Martinique, Peter M, MD;  Location: [facility];  Service: Cardiovascular;  Laterality: N/A;  . TEE WITHOUT CARDIOVERSION N/A 11/29/2019   Procedure: TRANSESOPHAGEAL ECHOCARDIOGRAM (TEE);  Surgeon: Wonda Olds, MD;  Location: [facility];  Service: Open Heart Surgery;  Laterality: N/A;    OB History   No obstetric history on file.      Home Medications    Prior to Admission medications   Medication Sig Start Date End Date Taking? Authorizing Provider  albuterol (VENTOLIN HFA) 108 (90 Base) MCG/ACT inhaler Inhale 2 puffs into the lungs every 4 (four) hours as needed for wheezing or shortness of breath.  11/03/19   [provider]  apixaban (ELIQUIS) 5 MG TABS tablet Take 1 tablet (5 mg total) by mouth 2 (two) times daily. 12/04/19  Antony Odea, PA-C  ascorbic acid (VITAMIN C) 500 MG tablet Take 500 mg by mouth daily.    [provider]  aspirin EC 81 MG tablet Take 1 tablet (81 mg total) by mouth daily. 03/07/20   Burnell Blanks, MD  Fluticasone-Salmeterol (ADVAIR) 100-50 MCG/DOSE AEPB Inhale 1 puff into the lungs daily as needed (SOB).  11/03/19   [provider]  furosemide (LASIX) 20 MG tablet Take 1 tablet (20 mg total) by mouth as needed (for swelling). 03/07/20   Burnell Blanks, MD  metoprolol tartrate (LOPRESSOR) 25 MG tablet Take 1 tablet (25 mg total) by mouth 2 (two) times daily. 12/04/19   Antony Odea, PA-C  rosuvastatin (CRESTOR) 40 MG tablet Take 1 tablet (40 mg total) by  mouth daily. 05/15/20   Richardson Dopp T, PA-C  vitamin E (VITAMIN E) 180 MG (400 UNITS) capsule Take 400 Units by mouth daily.    [provider]    Family History Family History  Problem Relation Age of Onset  . Coronary artery disease Mother     Social History Social History   Tobacco Use  . Smoking status: Former Smoker    Years: 15.00  . Smokeless tobacco: Never Used  Vaping Use  . Vaping Use: Never used  Substance Use Topics  . Alcohol use: Yes    Alcohol/week: 0.0 standard drinks    Comment: ocaasional  . Drug use: Never     Allergies   Patient has no known allergies.   Review of Systems Review of Systems  As stated above in HPI  Physical Exam Triage Vital Signs ED Triage Vitals [11/13/20 1118]  Enc Vitals Group     BP      Pulse      Resp      Temp      Temp src      SpO2      Weight 185 lb (83.9 kg)     Height 5\' 8"  (1.727 m)     Head Circumference      Peak Flow      Pain Score 7     Pain Loc      Pain Edu?      Excl. in [place]?    No data found.  Updated Vital Signs Ht 5\' 8"  (1.727 m)   Wt 185 lb (83.9 kg)   BMI 28.13 kg/m   Physical Exam Vitals and nursing note reviewed.  Constitutional:      General: She is not in acute distress.    Appearance: Normal appearance. She is not ill-appearing, toxic-appearing or diaphoretic.  Musculoskeletal:     Comments: Right 2nd PIP joint with edema. ROM reduced by about 50% due to pain. Tenderness to palpation. No increased heat or erythema.   Skin:    Comments: No evidence of skin breakdown, bleeding or discharge.   Neurological:     Mental Status: She is alert.    UC Treatments / Results  Labs (all labs ordered are listed, but only abnormal results are displayed) Labs Reviewed - No data to display  EKG   Radiology No results found.  Procedures Procedures (including critical care time)  Medications Ordered in UC Medications - No data to display  Initial Impression /  Assessment and Plan / UC Course  I have reviewed the triage vital signs and the nursing notes.  Pertinent labs & imaging results that were available during my care of the patient were reviewed by me  and considered in my medical decision making (see chart for details).     New. X ray to help confirm gout.   UPDATE: No evidence of osteomyelitis on x ray. Gout more likely. Discussed with patient the importance of BP monitoring, low salt diet and staying well hydrated with prednisone to help with her symptoms. Reviewed how to take along with common potential side effects and precautions. I have recommended that she follow up with her primary care as she may need additional testing and monitoring as we discussed in office today should she not respond as expected to the prednisone or should symptoms return.    Final Clinical Impressions(s) / UC Diagnoses   Final diagnoses:  None   Discharge Instructions   None    ED Prescriptions    None     PDMP not reviewed this encounter.   Hughie Closs, [place] 11/13/20 1320

## 2020-11-13 NOTE — ED Triage Notes (Signed)
Pt presents today with c/o of pain/swelling to right 2nd finger x 2 weeks. Has hx of gout and she feels her medicine is not helping [facility].

## 2021-03-15 IMAGING — CT CT ANGIO CHEST
2 of 7 series · 18 of 46 positions shown · IV contrast (APPLIED)
Comparison: None.

CLINICAL DATA: Low to intermediate probability for pulmonary
embolus. Positive D-dimer. Chest pain and SOB.

EXAM:
CT ANGIOGRAPHY CHEST WITH CONTRAST
TECHNIQUE: Multidetector CT imaging of the chest was performed using the
standard protocol during bolus administration of intravenous
contrast. Multiplanar CT image reconstructions and MIPs were
obtained to evaluate the vascular anatomy.
CONTRAST:  57mL OMNIPAQUE IOHEXOL 350 MG/ML SOLN

[Series 7: thins · axial · 0.63mm/px · z∈[-286,-35]mm · 15 of 404 slices shown]
[im 23/404  lung]
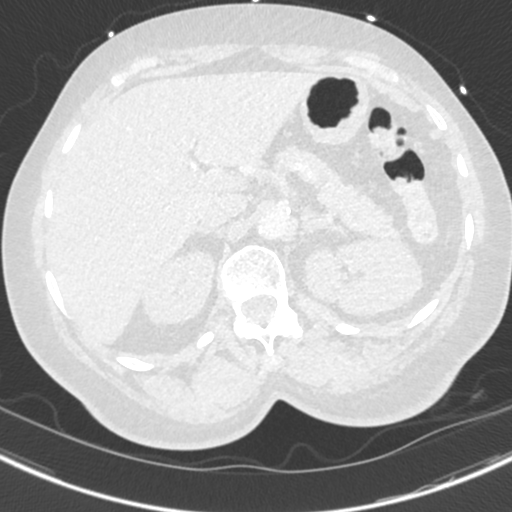
[im 45/404  soft-tissue]
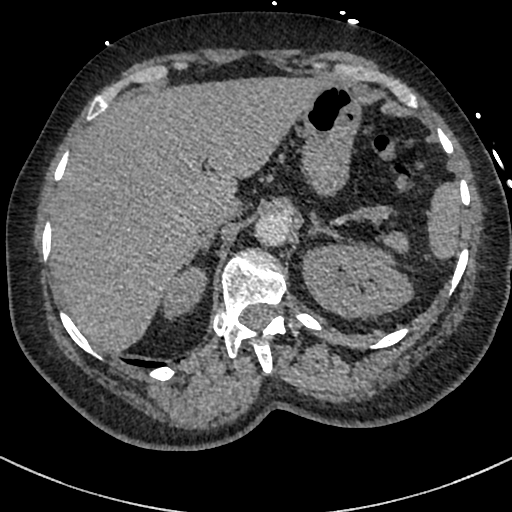
[im 68/404  lung]
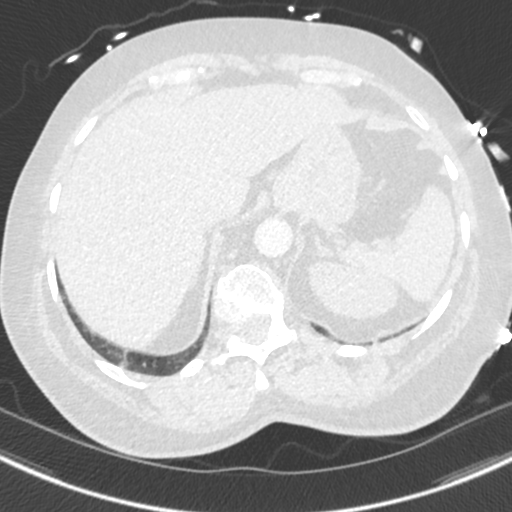
[im 90/404  soft-tissue]
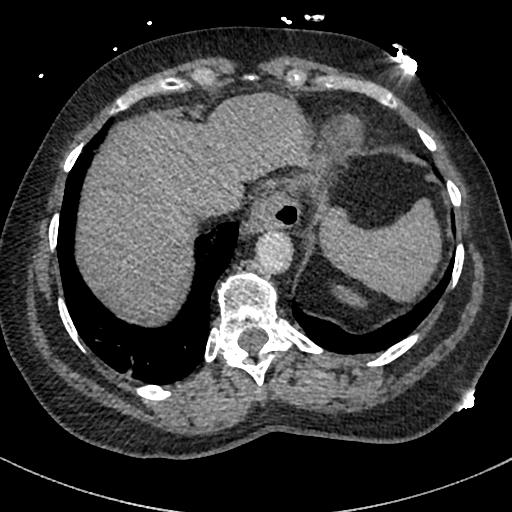
[im 135/404  lung]
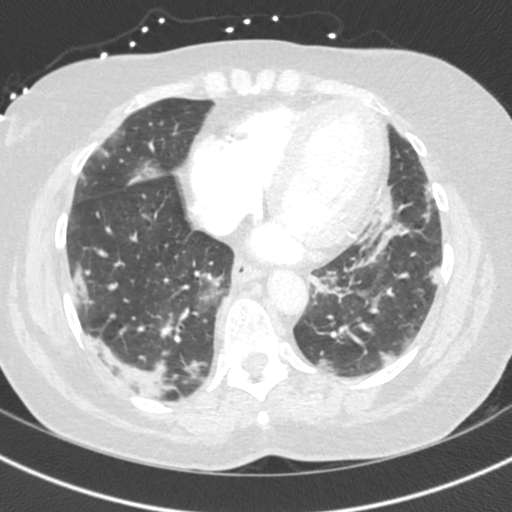
[im 157/404  soft-tissue]
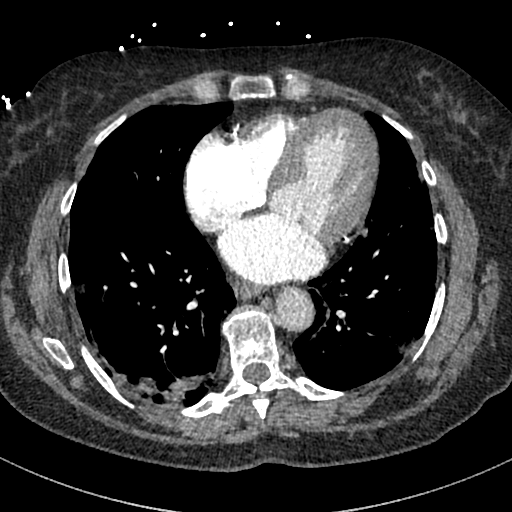
[im 180/404  lung]
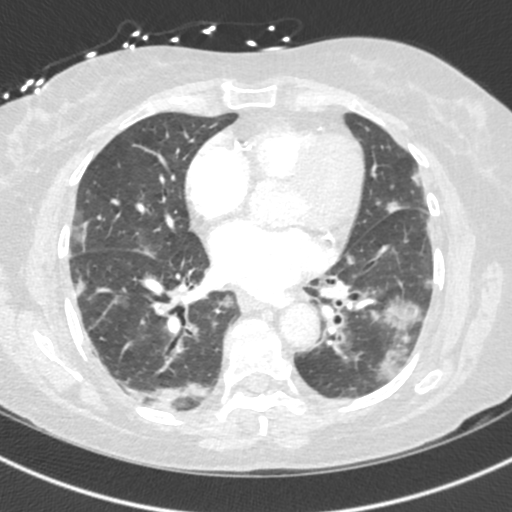
[im 202/404  soft-tissue]
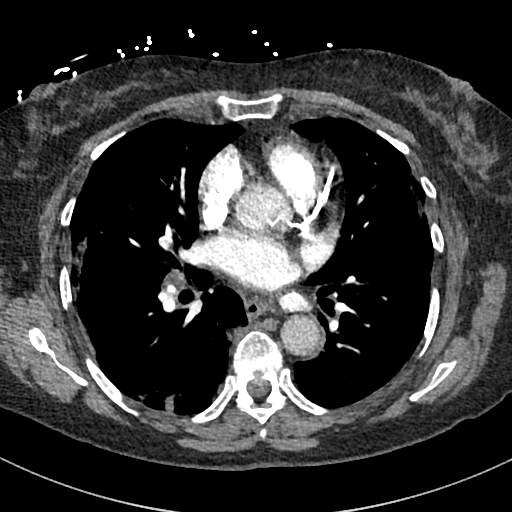
[im 224/404  lung]
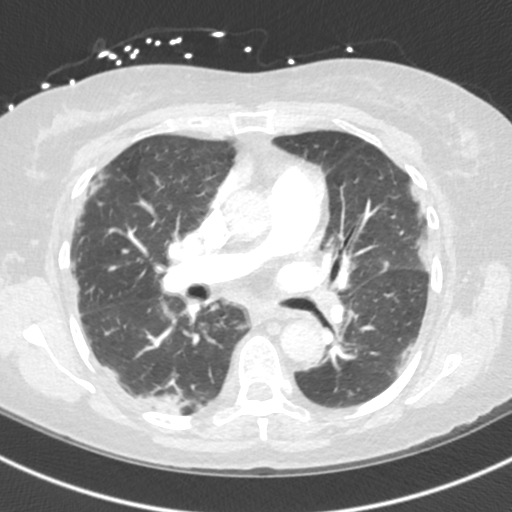
[im 247/404  soft-tissue]
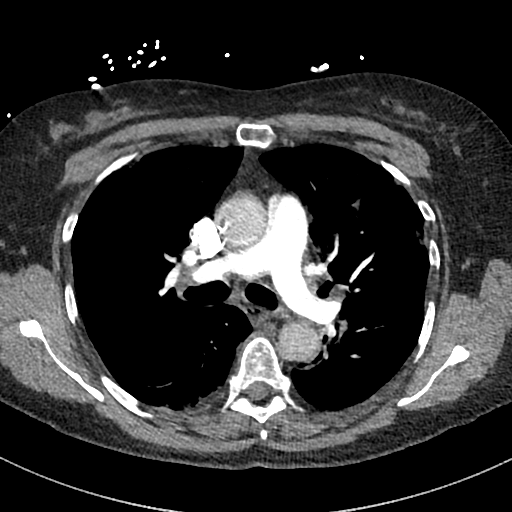
[im 269/404  lung]
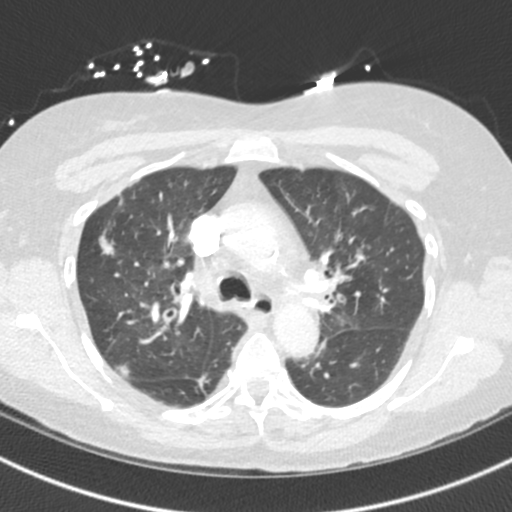
[im 314/404  soft-tissue]
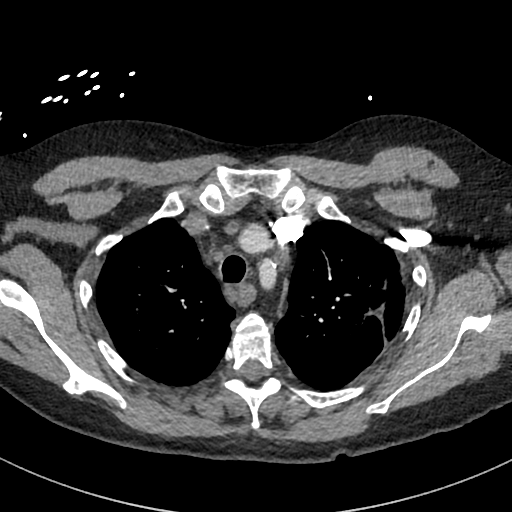
[im 336/404  lung]
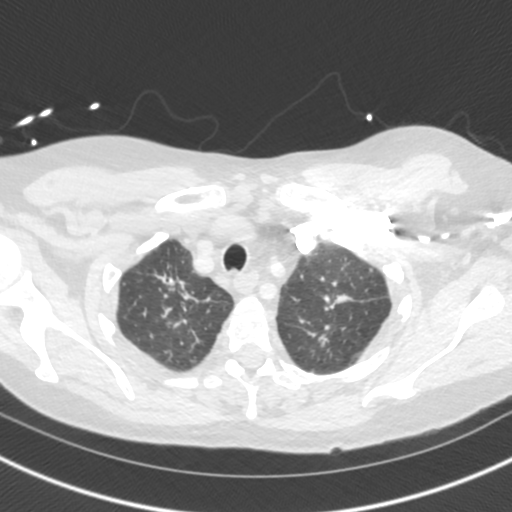
[im 359/404  soft-tissue]
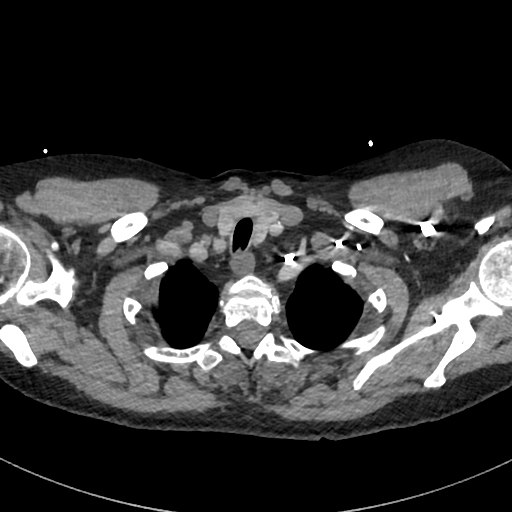
[im 381/404  lung]
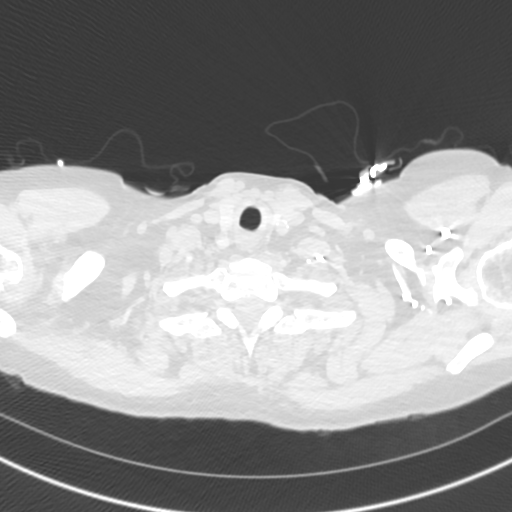

[Series 8: cor · coronal · 0.58mm/px · 3 of 148 slices shown]
[im 37/148  soft-tissue]
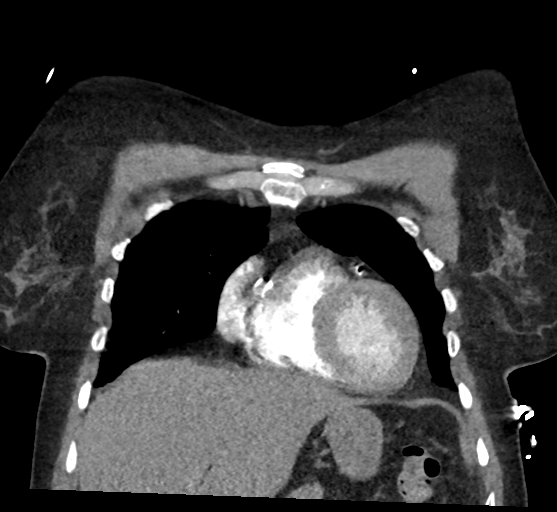
[im 74/148  soft-tissue]
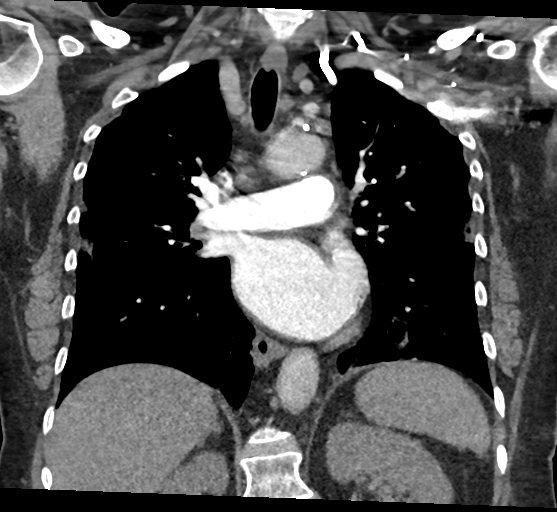
[im 111/148  soft-tissue]
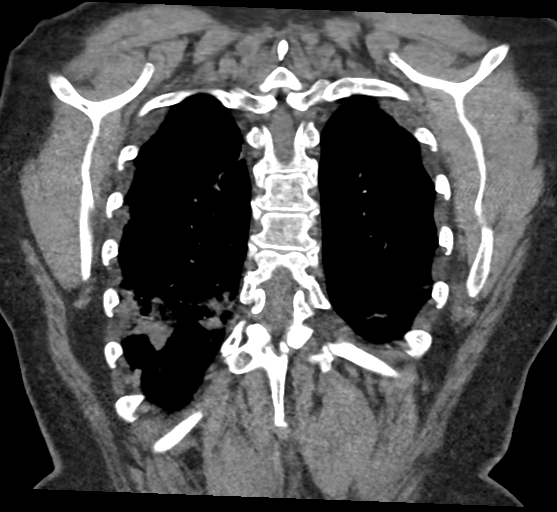

[18 of 46 positions shown; findings below may reference images not displayed]

FINDINGS: Cardiovascular: Filling defect within a segmental branch of the
posterior right lower lobe pulmonary artery is noted compatible with
pulmonary embolus, image 243/7. No additional pulmonary emboli
identified.

Mild cardiac enlargement. No pericardial effusion. Three vessel
coronary artery atherosclerotic calcifications. Aortic
atherosclerosis

Mediastinum/Nodes: Normal appearance of the thyroid gland. The
trachea appears patent and is midline. Small hiatal hernia. No
adenopathy identified.

Lungs/Pleura: There is a mosaic attenuation pattern identified
bilaterally. Bilateral multifocal areas of peripheral predominant
patchy and nodular areas of ground-glass attenuate and airspace
consolidation is identified involving the upper and lower lobes.
Scar like densities are noted within both upper lobes.

Upper Abdomen: No acute abnormality.

Musculoskeletal: No chest wall abnormality. No acute or significant
osseous findings.

Review of the MIP images confirms the above findings.
IMPRESSION: 1. Examination is positive for acute pulmonary embolus within a
segmental branch of the posterior right lower lobe pulmonary artery.
2. Bilateral multifocal areas of ground-glass attenuate and nodular
attenuates the upper and lower lobes. Findings are favored to
represent multifocal pneumonia. Follow-up imaging is advised in 3-4
weeks following trial of antibiotic therapy to ensure resolution and
exclude underlying malignancy.
3. Multi vessel coronary artery atherosclerotic calcifications.
4. Hiatal hernia.
5. Critical Value/emergent results were called by telephone at the
time of interpretation on 11/26/2019 at [DATE] to provider Jumper
Sehmi, who verbally acknowledged these results.

Aortic Atherosclerosis ([id number]).

## 2021-03-15 IMAGING — DX DG CHEST 2V
2 series · 2 of 2 positions shown · non-contrast
Comparison: 07/17/2016

CLINICAL DATA: Chest pain.  Shortness of breath.

EXAM:
CHEST - 2 VIEW

[chest pa]
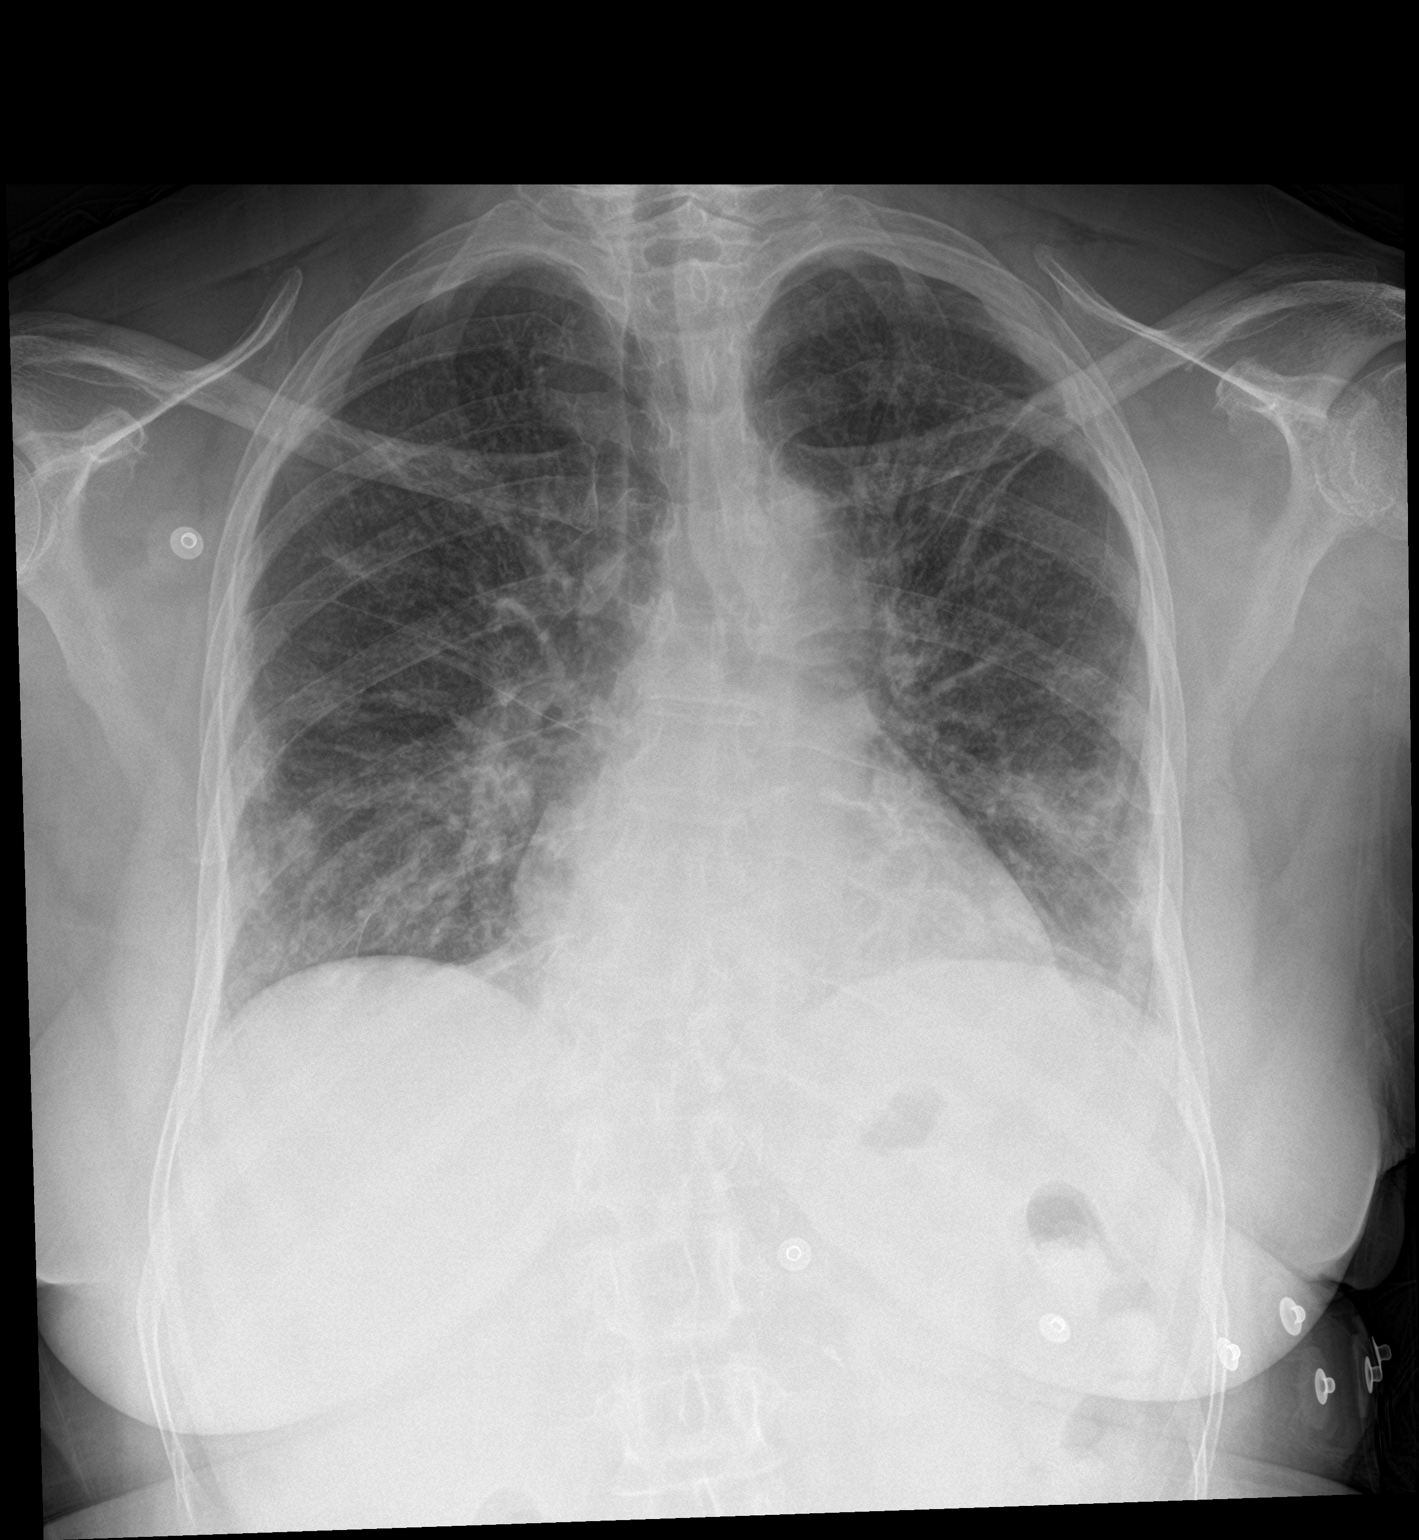

[chest lat]
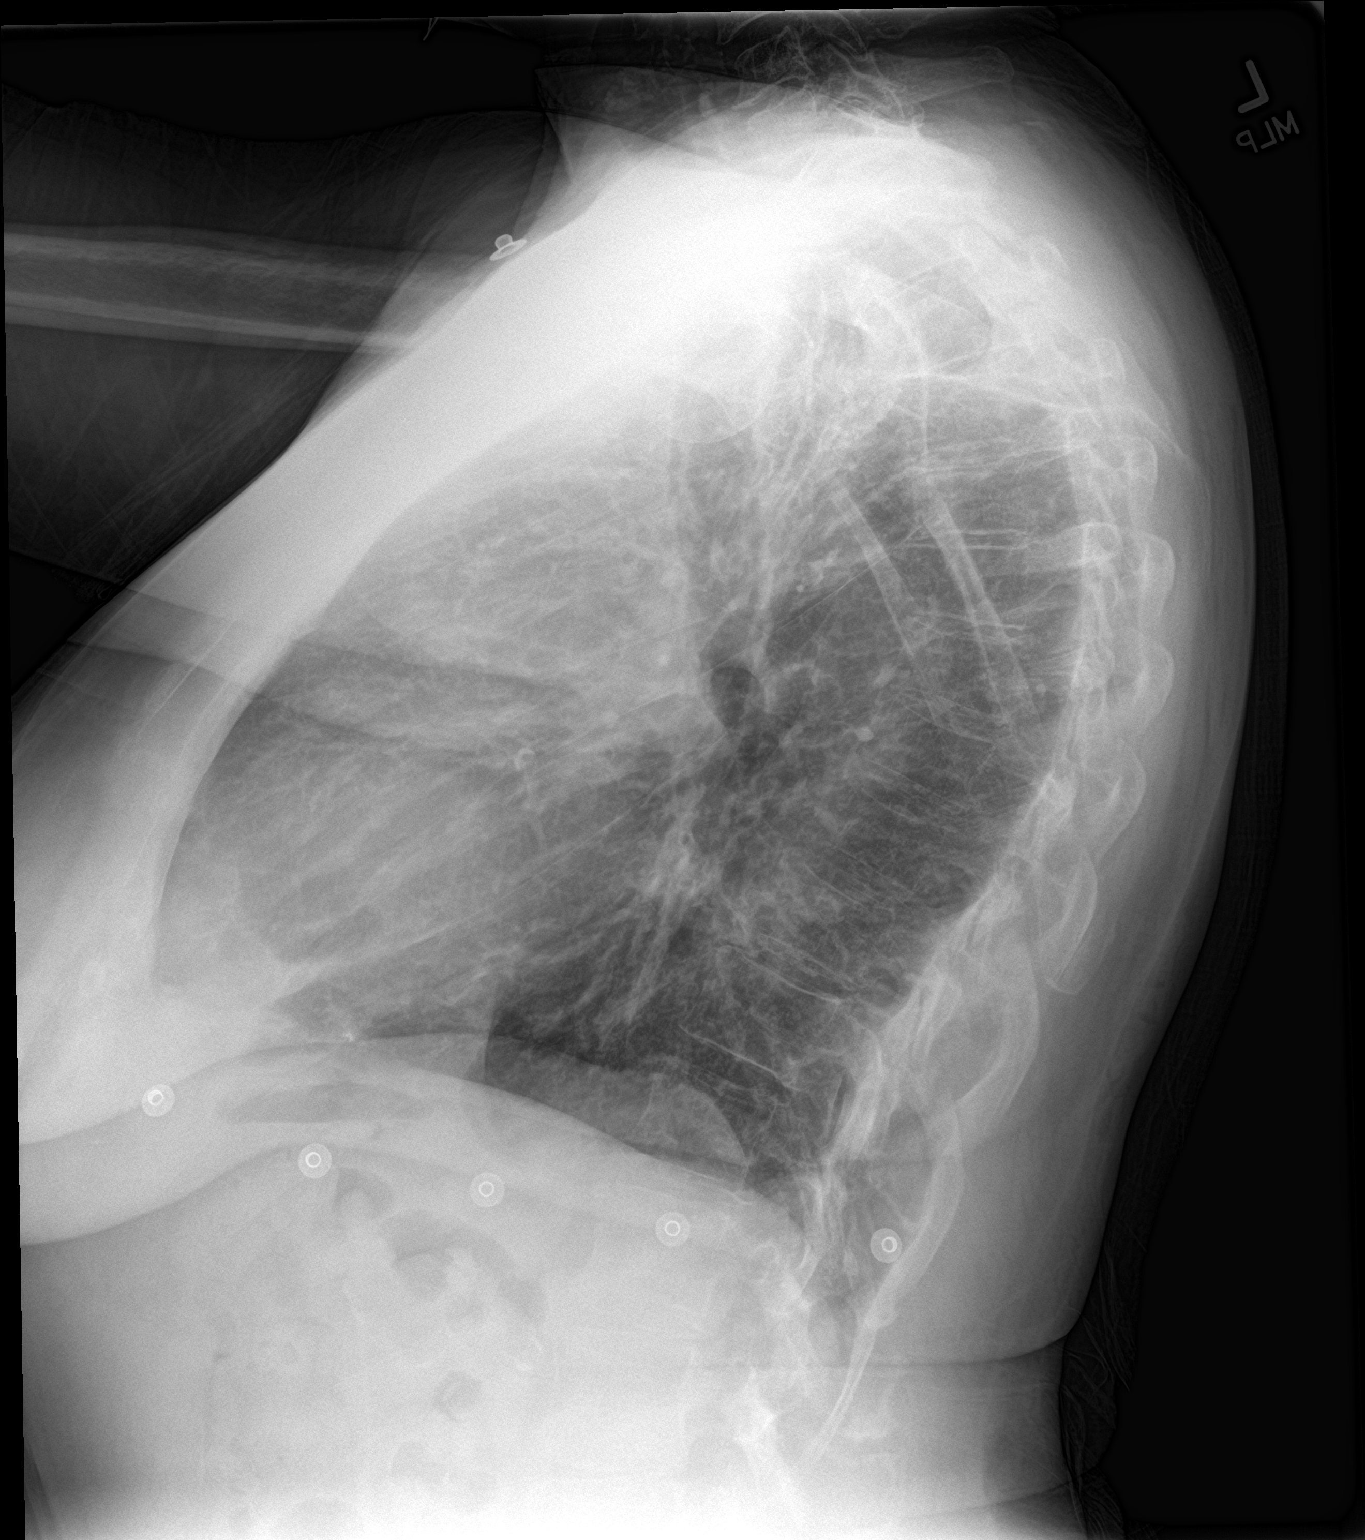

[2 of 2 positions shown; findings below may reference images not displayed]

FINDINGS: Heart size upper limits of normal. Patchy bilateral mid and lower
lung pulmonary infiltrates most consistent with bronchopneumonia,
either bacterial or viral. No evidence of dense consolidation or
lobar collapse. No effusions. No acute bone finding. Chronic
scoliosis.
IMPRESSION: Widespread patchy pulmonary infiltrates most consistent with
pneumonia. This could be bacterial or viral.

## 2021-05-23 ENCOUNTER — Other Ambulatory Visit (HOSPITAL_COMMUNITY): Payer: Self-pay | Admitting: Cardiovascular Disease

## 2021-05-23 ENCOUNTER — Ambulatory Visit (HOSPITAL_COMMUNITY)
Admission: RE | Admit: 2021-05-23 | Discharge: 2021-05-23 | Disposition: A | Discharge status: Home / Self Care | Payer: Medicare PPO | Source: Ambulatory Visit | Attending: Cardiology | Admitting: Cardiology

## 2021-05-23 ENCOUNTER — Other Ambulatory Visit: Payer: Self-pay

## 2021-05-23 DIAGNOSIS — I6523 Occlusion and stenosis of bilateral carotid arteries: Secondary | ICD-10-CM | POA: Insufficient documentation

## 2021-08-15 ENCOUNTER — Other Ambulatory Visit: Payer: Self-pay | Admitting: Podiatry

## 2021-08-15 DIAGNOSIS — M25572 Pain in left ankle and joints of left foot: Secondary | ICD-10-CM

## 2021-09-06 ENCOUNTER — Other Ambulatory Visit: Payer: Self-pay

## 2021-09-06 ENCOUNTER — Ambulatory Visit
Admission: RE | Admit: 2021-09-06 | Discharge: 2021-09-06 | Disposition: A | Discharge status: Home / Self Care | Payer: Medicare PPO | Source: Ambulatory Visit | Attending: Podiatry | Admitting: Podiatry

## 2021-09-06 DIAGNOSIS — M25572 Pain in left ankle and joints of left foot: Secondary | ICD-10-CM

## 2021-09-17 LAB — COLOGUARD: COLOGUARD: POSITIVE — AB

## 2021-10-16 ENCOUNTER — Emergency Department (HOSPITAL_BASED_OUTPATIENT_CLINIC_OR_DEPARTMENT_OTHER)
Admit: 2021-10-16 | Discharge: 2021-10-16 | Disposition: A | Discharge status: Home / Self Care | Payer: Medicare PPO | Attending: Emergency Medicine | Admitting: Emergency Medicine

## 2021-10-16 ENCOUNTER — Emergency Department (HOSPITAL_COMMUNITY)
Admission: EM | Admit: 2021-10-16 | Discharge: 2021-10-17 | Disposition: A | Discharge status: Home / Self Care | Payer: Medicare PPO | Attending: Emergency Medicine | Admitting: Emergency Medicine

## 2021-10-16 ENCOUNTER — Emergency Department (HOSPITAL_COMMUNITY): Payer: Medicare PPO

## 2021-10-16 ENCOUNTER — Encounter (HOSPITAL_COMMUNITY): Payer: Self-pay | Admitting: Emergency Medicine

## 2021-10-16 DIAGNOSIS — R0789 Other chest pain: Secondary | ICD-10-CM | POA: Diagnosis not present

## 2021-10-16 DIAGNOSIS — J449 Chronic obstructive pulmonary disease, unspecified: Secondary | ICD-10-CM | POA: Diagnosis not present

## 2021-10-16 DIAGNOSIS — I2510 Atherosclerotic heart disease of native coronary artery without angina pectoris: Secondary | ICD-10-CM | POA: Insufficient documentation

## 2021-10-16 DIAGNOSIS — R002 Palpitations: Secondary | ICD-10-CM | POA: Diagnosis not present

## 2021-10-16 DIAGNOSIS — I10 Essential (primary) hypertension: Secondary | ICD-10-CM | POA: Diagnosis not present

## 2021-10-16 DIAGNOSIS — Z7901 Long term (current) use of anticoagulants: Secondary | ICD-10-CM | POA: Insufficient documentation

## 2021-10-16 DIAGNOSIS — R600 Localized edema: Secondary | ICD-10-CM | POA: Insufficient documentation

## 2021-10-16 DIAGNOSIS — I2699 Other pulmonary embolism without acute cor pulmonale: Secondary | ICD-10-CM | POA: Diagnosis not present

## 2021-10-16 DIAGNOSIS — Z951 Presence of aortocoronary bypass graft: Secondary | ICD-10-CM | POA: Diagnosis not present

## 2021-10-16 DIAGNOSIS — M7989 Other specified soft tissue disorders: Secondary | ICD-10-CM | POA: Diagnosis not present

## 2021-10-16 DIAGNOSIS — Z7951 Long term (current) use of inhaled steroids: Secondary | ICD-10-CM | POA: Insufficient documentation

## 2021-10-16 DIAGNOSIS — R079 Chest pain, unspecified: Secondary | ICD-10-CM | POA: Diagnosis present

## 2021-10-16 DIAGNOSIS — Z7982 Long term (current) use of aspirin: Secondary | ICD-10-CM | POA: Diagnosis not present

## 2021-10-16 DIAGNOSIS — Z79899 Other long term (current) drug therapy: Secondary | ICD-10-CM | POA: Diagnosis not present

## 2021-10-16 LAB — CBC WITH DIFFERENTIAL/PLATELET
Abs Immature Granulocytes: 0.04 10*3/uL (ref 0.00–0.07)
Basophils Absolute: 0 10*3/uL (ref 0.0–0.1)
Basophils Relative: 0 %
Eosinophils Absolute: 0.1 10*3/uL (ref 0.0–0.5)
Eosinophils Relative: 1 %
HCT: 38.1 % (ref 36.0–46.0)
Hemoglobin: 12.3 g/dL (ref 12.0–15.0)
Immature Granulocytes: 0 %
Lymphocytes Relative: 24 %
Lymphs Abs: 2.1 10*3/uL (ref 0.7–4.0)
MCH: 31.3 pg (ref 26.0–34.0)
MCHC: 32.3 g/dL (ref 30.0–36.0)
MCV: 96.9 fL (ref 80.0–100.0)
Monocytes Absolute: 1 10*3/uL (ref 0.1–1.0)
Monocytes Relative: 11 %
Neutro Abs: 5.7 10*3/uL (ref 1.7–7.7)
Neutrophils Relative %: 64 %
Platelets: 316 10*3/uL (ref 150–400)
RBC: 3.93 MIL/uL (ref 3.87–5.11)
RDW: 13.7 % (ref 11.5–15.5)
WBC: 8.9 10*3/uL (ref 4.0–10.5)
nRBC: 0 % (ref 0.0–0.2)

## 2021-10-16 LAB — TROPONIN I (HIGH SENSITIVITY)
Troponin I (High Sensitivity): 7 ng/L (ref <18)
Troponin I (High Sensitivity): 8 ng/L (ref <18)

## 2021-10-16 LAB — BASIC METABOLIC PANEL
Anion gap: 12 (ref 5–15)
BUN: 11 mg/dL (ref 8–23)
CO2: 23 mmol/L (ref 22–32)
Calcium: 9.4 mg/dL (ref 8.9–10.3)
Chloride: 103 mmol/L (ref 98–111)
Creatinine, Ser: 0.96 mg/dL (ref 0.44–1.00)
GFR, Estimated: >60 mL/min (ref >60)
Glucose, Bld: 117 mg/dL — ABNORMAL HIGH (ref 70–99)
Potassium: 3.4 mmol/L — ABNORMAL LOW (ref 3.5–5.1)
Sodium: 138 mmol/L (ref 135–145)

## 2021-10-16 LAB — BRAIN NATRIURETIC PEPTIDE: B Natriuretic Peptide: 100.5 pg/mL — ABNORMAL HIGH (ref 0.0–100.0)

## 2021-10-16 LAB — MAGNESIUM: Magnesium: 2.1 mg/dL (ref 1.7–2.4)

## 2021-10-16 NOTE — Progress Notes (Signed)
Bilateral lower extremity venous duplex has been completed. Preliminary results can be found in CV Proc through chart review.  Results were given to [facility] PA.  10/16/21 3:11 PM Carlos Levering RVT

## 2021-10-16 NOTE — ED Provider Triage Note (Signed)
Emergency Medicine Provider Triage Evaluation Note  Miranda [name] , a 71 y.o. female  was evaluated in triage.  Pt complains of potation's, chest tightness.  Symptoms over the last week.  Does have history of PE.  Not anticoagulated.  She states she has some swelling to bilateral lower extremities which is worse than normal. No back pain, nausea. NO hx of CHF per patient   Review of Systems  Positive: Cp, sob, palpitations Negative:   Physical Exam  There were no vitals taken for this visit. Gen:   Awake, no distress   Resp:  Normal effort  MSK:   Moves extremities without difficulty  Other:    Medical Decision Making  Medically screening exam initiated at 2:40 PM.  Appropriate orders placed.  JALASIA ESKRIDGE was informed that the remainder of the evaluation will be completed by another provider, this initial triage assessment does not replace that evaluation, and the importance of remaining in the ED until their evaluation is complete.  CP, palpitations , hx of PE   Asya Derryberry A, PA-C 10/16/21 1444

## 2021-10-16 NOTE — ED Triage Notes (Signed)
Patient sent to [facility] for evaluation of palpitations x1 week, significant ectopy noted with EMS. Patient denies nausea and vomiting and diarrhea. 18g saline lock in left AC. Patient alert, oriented, and in no apparent distress at this time.  EMS vitals 160/82  HR 90 RR18 CBG 137

## 2021-10-17 MED ORDER — ACETAMINOPHEN 325 MG PO TABS
650.0000 mg | ORAL_TABLET | Freq: Once | ORAL | Status: AC
  Administered 2021-10-17: 650 mg via ORAL
  Filled 2021-10-17: qty 2

## 2021-10-17 MED ORDER — INDOMETHACIN 25 MG PO CAPS
25.0000 mg | ORAL_CAPSULE | Freq: Once | ORAL | Status: AC
Start: 2021-10-17 — End: 2021-10-17
  Administered 2021-10-17: 25 mg via ORAL
  Filled 2021-10-17: qty 1

## 2021-10-17 MED ORDER — INDOMETHACIN 25 MG PO CAPS: 25.0000 mg | ORAL_CAPSULE | Freq: Three times a day (TID) | ORAL | 0 refills | Status: AC | PRN

## 2021-10-17 NOTE — Discharge Instructions (Signed)
Take the indomethacin as prescribed.  Do not take any other NSAIDs such as ibuprofen or Advil while on this medication.  Do not take any steroids either. You informed us that you are no longer taking the Eliquis as told by your doctor. Follow-up with your primary care provider and cardiologist. Return to the ER if you start to experience worsening chest pain, shortness of breath, increased foot pain or swelling, injuries or falls, numbness or weakness peer

## 2021-10-17 NOTE — ED Provider Notes (Signed)
[facility] EMERGENCY DEPARTMENT Provider Note   CSN: [id number] Arrival date & time: 10/16/21  1437     History  Chief Complaint  Patient presents with   Chest Pain    Miranda [name] is a 71 y.o. female with a past medical history of CAD status post CABG in February 2021, history of PE, hypertension, hyperlipidemia, COPD presenting to the ED with a chief complaint of chest pain and palpitations.  Symptoms began yesterday when she arrived to the ER.  She reports swelling to bilateral lower extremities but is worse on the right side and she is concerned that this is due to gout and bursitis which she has a history of.  She is being treated for this.  She states that her symptoms have significantly improved since waiting in the waiting room.  She has been compliant with her home medications.  She is not currently anticoagulated.  She states that she did have palpitations yesterday and was sent to the ER for "something on my EKG."  Denies any abdominal pain, vomiting, shortness of breath   Chest Pain Associated symptoms: palpitations   Associated symptoms: no abdominal pain, no cough, no dizziness, no fever, no nausea, no shortness of breath, no vomiting and no weakness       Home Medications Prior to Admission medications   Medication Sig Start Date End Date Taking? Authorizing Provider  acetaminophen (TYLENOL) 500 MG tablet Take 1,000 mg by mouth every 6 (six) hours as needed for moderate pain or headache.   Yes [provider]  albuterol (VENTOLIN HFA) 108 (90 Base) MCG/ACT inhaler Inhale 2 puffs into the lungs every 4 (four) hours as needed for wheezing or shortness of breath.  11/03/19  Yes [provider]  allopurinol (ZYLOPRIM) 100 MG tablet Take 100 mg by mouth daily. 10/01/21  Yes [provider]  colchicine 0.6 MG tablet Take 0.6 mg by mouth daily as needed (gout flares). 06/30/21  Yes [provider]  Fluticasone-Salmeterol  (ADVAIR) 100-50 MCG/DOSE AEPB Inhale 1 puff into the lungs daily as needed (SOB).  11/03/19  Yes [provider]  furosemide (LASIX) 20 MG tablet Take 1 tablet (20 mg total) by mouth as needed (for swelling). 03/07/20  Yes Burnell Blanks, MD  ibuprofen (ADVIL) 200 MG tablet Take 400 mg by mouth every 6 (six) hours as needed for headache or moderate pain.   Yes [provider]  indomethacin (INDOCIN) 25 MG capsule Take 1 capsule (25 mg total) by mouth 3 (three) times daily as needed. 10/17/21  Yes Gavriella Hearst, PA-C  losartan-hydrochlorothiazide (HYZAAR) 50-12.5 MG tablet Take 1 tablet by mouth daily. 08/14/21  Yes [provider]  rosuvastatin (CRESTOR) 40 MG tablet Take 1 tablet (40 mg total) by mouth daily. 05/15/20  Yes Weaver, Scott T, PA-C  traMADol (ULTRAM) 50 MG tablet Take 50 mg by mouth 3 (three) times daily as needed for moderate pain. 10/14/21  Yes [provider]  vitamin B-12 (CYANOCOBALAMIN) 1000 MCG tablet Take 1,000 mcg by mouth daily.   Yes [provider]  apixaban (ELIQUIS) 5 MG TABS tablet Take 1 tablet (5 mg total) by mouth 2 (two) times daily. Patient not taking: Reported on 10/17/2021 12/04/19   Antony Odea, PA-C  aspirin EC 81 MG tablet Take 1 tablet (81 mg total) by mouth daily. Patient not taking: Reported on 10/17/2021 03/07/20   Burnell Blanks, MD  metoprolol tartrate (LOPRESSOR) 25 MG tablet Take 1 tablet (  25 mg total) by mouth 2 (two) times daily. Patient not taking: Reported on 10/17/2021 12/04/19   Antony Odea, PA-C  predniSONE (DELTASONE) 10 MG tablet Take 2 tablets (20 mg total) by mouth daily with breakfast. Patient not taking: Reported on 10/17/2021 11/13/20   Hughie Closs, PA-C      Allergies    Patient has no known allergies.    Review of Systems   Review of Systems  Constitutional:  Negative for appetite change, chills and fever.  HENT:  Negative for ear pain, rhinorrhea, sneezing and sore  throat.   Eyes:  Negative for photophobia and visual disturbance.  Respiratory:  Negative for cough, chest tightness, shortness of breath and wheezing.   Cardiovascular:  Positive for chest pain, palpitations and leg swelling.  Gastrointestinal:  Negative for abdominal pain, blood in stool, constipation, diarrhea, nausea and vomiting.  Genitourinary:  Negative for dysuria, hematuria and urgency.  Musculoskeletal:  Negative for myalgias.  Skin:  Negative for rash.  Neurological:  Negative for dizziness, weakness and light-headedness.   Physical Exam Updated Vital Signs BP 140/81    Pulse 98    Temp 99.2 F (37.3 C) (Oral)    Resp 20    SpO2 99%  Physical Exam Vitals and nursing note reviewed.  Constitutional:      General: She is not in acute distress.    Appearance: She is well-developed.     Comments: Speaking complete sentences without difficulty.  No signs of distress  HENT:     Head: Normocephalic and atraumatic.     Nose: Nose normal.  Eyes:     General: No scleral icterus.       Left eye: No discharge.     Conjunctiva/sclera: Conjunctivae normal.  Cardiovascular:     Rate and Rhythm: Normal rate and regular rhythm.     Heart sounds: Normal heart sounds. No murmur heard.   No friction rub. No gallop.  Pulmonary:     Effort: Pulmonary effort is normal. No respiratory distress.     Breath sounds: Normal breath sounds.  Abdominal:     General: Bowel sounds are normal. There is no distension.     Palpations: Abdomen is soft.     Tenderness: There is no abdominal tenderness. There is no guarding.  Musculoskeletal:        General: Swelling and tenderness present. Normal range of motion.     Cervical back: Normal range of motion and neck supple.     Comments: Bilateral ankle swelling with tenderness at first MTP joint.  2+ DP pulses noted bilaterally.  Skin:    General: Skin is warm and dry.     Findings: No rash.  Neurological:     Mental Status: She is alert.     Motor:  No abnormal muscle tone.     Coordination: Coordination normal.    ED Results / Procedures / Treatments   Labs (all labs ordered are listed, but only abnormal results are displayed) Labs Reviewed  BASIC METABOLIC PANEL - Abnormal; Notable for the following components:      Result Value   Potassium 3.4 (*)    Glucose, Bld 117 (*)    All other components within normal limits  BRAIN NATRIURETIC PEPTIDE - Abnormal; Notable for the following components:   B Natriuretic Peptide 100.5 (*)    All other components within normal limits  CBC WITH DIFFERENTIAL/PLATELET  MAGNESIUM  TROPONIN I (HIGH SENSITIVITY)  TROPONIN I (HIGH SENSITIVITY)  EKG EKG Interpretation  Date/Time:  Friday October 17 2021 13:00:10 EST Ventricular Rate:  91 PR Interval:  154 QRS Duration: 86 QT Interval:  368 QTC Calculation: 452 R Axis:   64 Text Interpretation: Normal sinus rhythm Nonspecific T wave abnormality Abnormal ECG When compared with ECG of 16-Oct-2021 14:47, PREVIOUS ECG IS PRESENT Similar to previous Confirmed by Lavenia Atlas [phone]) on 10/17/2021 1:30:27 PM  Radiology DG Chest 2 View  Result Date: 10/16/2021 CLINICAL DATA:  Chest pain, palpitation, shortness of breath EXAM: CHEST - 2 VIEW COMPARISON:  01/01/2020 FINDINGS: Cardiac size is within normal limits. There are no signs of pulmonary edema. There are linear densities in both upper lung fields with no significant interval change. No new focal infiltrates are seen. There is no pleural effusion or pneumothorax. There is previous coronary bypass surgery. IMPRESSION: There are no signs of pulmonary edema or focal pulmonary consolidation. Linear densities in both upper lung fields have not changed significantly suggesting scarring. Electronically Signed   By: Elmer Picker M.D.   On: 10/16/2021 16:34   VAS [place] LOWER EXTREMITY VENOUS (DVT) (ONLY [facility] & [facility])  Result Date: 10/16/2021  Lower Venous DVT Study Patient Name:  Miranda [name] [facility]  Date of  Exam:   10/16/2021 Medical Rec #: [id number]        Accession #:    [id number] Date of Birth: 01/23/1951         Patient Gender: F Patient Age:   79 years Exam Location:  [facility] Procedure:      VAS [place] LOWER EXTREMITY VENOUS (DVT) Referring Phys: BRITNI HENDERLY --------------------------------------------------------------------------------  Indications: Swelling.  Risk Factors: None identified. Comparison Study: No prior studies. Performing Technologist: Oliver Hum RVT  Examination Guidelines: A complete evaluation includes B-mode imaging, spectral Doppler, color Doppler, and power Doppler as needed of all accessible portions of each vessel. Bilateral testing is considered an integral part of a complete examination. Limited examinations for reoccurring indications may be performed as noted. The reflux portion of the exam is performed with the patient in reverse Trendelenburg.  +---------+---------------+---------+-----------+----------+--------------+  RIGHT     Compressibility Phasicity Spontaneity Properties Thrombus Aging  +---------+---------------+---------+-----------+----------+--------------+  CFV       Full            Yes       Yes                                    +---------+---------------+---------+-----------+----------+--------------+  SFJ       Full                                                             +---------+---------------+---------+-----------+----------+--------------+  FV Prox   Full                                                             +---------+---------------+---------+-----------+----------+--------------+  FV Mid    Full                                                             +---------+---------------+---------+-----------+----------+--------------+  FV Distal Full                                                             +---------+---------------+---------+-----------+----------+--------------+  PFV       Full                                                              +---------+---------------+---------+-----------+----------+--------------+  POP       Full            Yes       Yes                                    +---------+---------------+---------+-----------+----------+--------------+  PTV       Full                                                             +---------+---------------+---------+-----------+----------+--------------+  PERO      Full                                                             +---------+---------------+---------+-----------+----------+--------------+   +---------+---------------+---------+-----------+----------+--------------+  LEFT      Compressibility Phasicity Spontaneity Properties Thrombus Aging  +---------+---------------+---------+-----------+----------+--------------+  CFV       Full            Yes       Yes                                    +---------+---------------+---------+-----------+----------+--------------+  SFJ       Full                                                             +---------+---------------+---------+-----------+----------+--------------+  FV Prox   Full                                                             +---------+---------------+---------+-----------+----------+--------------+  FV Mid    Full                                                             +---------+---------------+---------+-----------+----------+--------------+  FV Distal Full                                                             +---------+---------------+---------+-----------+----------+--------------+  PFV       Full                                                             +---------+---------------+---------+-----------+----------+--------------+  POP       Full            Yes       Yes                                    +---------+---------------+---------+-----------+----------+--------------+  PTV       Full                                                              +---------+---------------+---------+-----------+----------+--------------+  PERO      Full                                                             +---------+---------------+---------+-----------+----------+--------------+     Summary: RIGHT: - There is no evidence of deep vein thrombosis in the lower extremity.  - No cystic structure found in the popliteal fossa.  LEFT: - There is no evidence of deep vein thrombosis in the lower extremity.  - No cystic structure found in the popliteal fossa.  *See table(s) above for measurements and observations. Electronically signed by Jamelle Haring on 10/16/2021 at 4:10:47 PM.    Final     Procedures Procedures    Medications Ordered in ED Medications  indomethacin (INDOCIN) capsule 25 mg (has no administration in time range)  acetaminophen (TYLENOL) tablet 650 mg (650 mg Oral Given 10/17/21 1311)    ED Course/ Medical Decision Making/ A&P Clinical Course as of 10/17/21 1417  Fri Oct 17, 2021  1354 Patient states that she is not currently on any NSAIDs or prednisone.  She has not taken Eliquis in over a year as told by her specialist. [HK]    Clinical Course User Index [HK] Delia Heady, PA-C                           Medical Decision Making  71 year old female presenting to the ED with a chief complaint of palpitations and chest pain.  Symptoms began yesterday.  Unfortunately she had prolonged wait time in the waiting room and since then her symptoms have improved.  She endorses bilateral ankle swelling and right foot pain and swelling that she states is due to bursitis and  gout which she has a history of.  She has been compliant with all of her home medications.  She is not currently anticoagulated.  Patient's vital signs within normal limits.  She does have history of NSTEMI and underwent CABG last year.  Work-up initiated in triage with negative bilateral lower extremity venous ultrasounds without evidence of DVT.  EKG shows ectopy.  Chest x-ray is  unremarkable.  Troponins are negative x2.  BMP, BNP, CBC and magnesium are normal.  Repeat EKG done during my evaluation shows normal sinus rhythm without evidence of strain.  I suspect that her foot pain is likely due to gout.  We will treat with indomethacin for this.  She was told to discontinue her Eliquis a year ago and is not currently on other NSAIDs or steroid therapy.  Advised her to not take any other NSAIDs while being on indomethacin.  Pain is controlled here.  No evidence of DVT on today's work-up.  No evidence of ACS.  Symptoms could be musculoskeletal in nature.  We will have her follow-up with PCP and return for worsening symptoms.    Patient is hemodynamically stable, in NAD, and able to ambulate in the ED. Evaluation does not show pathology that would require ongoing emergent intervention or inpatient treatment. I explained the diagnosis to the patient. Pain has been managed and has no complaints prior to discharge. Patient is comfortable with above plan and is stable for discharge at this time. All questions were answered prior to disposition. Strict return precautions for returning to the ED were discussed. Encouraged follow up with PCP.   An After Visit Summary was printed and given to the patient.   Portions of this note were generated with Lobby[employer]. Dictation errors may occur despite best attempts at proofreading.         Final Clinical Impression(s) / ED Diagnoses Final diagnoses:  None    Rx / DC Orders ED Discharge Orders          Ordered    indomethacin (INDOCIN) 25 MG capsule  3 times daily PRN        10/17/21 1346              Delia Heady, PA-C 10/17/21 1417    Horton, Alvin Critchley, DO 10/20/21 1505

## 2021-10-17 NOTE — ED Notes (Signed)
Pt denies CP at this time 

## 2022-05-19 ENCOUNTER — Other Ambulatory Visit (HOSPITAL_COMMUNITY): Payer: Self-pay | Admitting: Cardiovascular Disease

## 2022-05-19 DIAGNOSIS — I6523 Occlusion and stenosis of bilateral carotid arteries: Secondary | ICD-10-CM

## 2022-05-25 ENCOUNTER — Ambulatory Visit (HOSPITAL_COMMUNITY): Admission: RE | Admit: 2022-05-25 | Payer: Medicare PPO | Source: Ambulatory Visit

## 2023-05-10 ENCOUNTER — Other Ambulatory Visit: Payer: Self-pay

## 2023-05-18 ENCOUNTER — Encounter: Payer: Self-pay | Admitting: *Deleted

## 2024-02-19 NOTE — Progress Notes (Unsigned)
 Cardiology Office Note:    Date:  02/23/2024   ID:  Miranda [name], DOB Oct 30, 1950, MRN [id number]  PCP:  Hershell Lose, NP   [facility] Providers Cardiologist:  Antoinette Batman, MD     Referring MD: Hershell Lose, NP   Chief Complaint  Patient presents with   Coronary Artery Disease   Palpitations    History of Present Illness:    Miranda [name] is a 73 y.o. female seen at the request of Dr Eloy Half for evaluation of frequent PVCs. Previously seen by Dr Abel Hoe - last in May 2021. She has a history of CAD s/p CABG, PE, carotid artery disease, PAD, HLD, HTN, COPD and gout. She was admitted to [facility] February 2021 with a NSTEMI in the setting of pneumonia and acute PE. Cardiac cath with three vessel CAD. She underwent 4V CABG February 2021. Echo 11/26/19 with LVEF=50-55%. Mild MR.   She recently had palpitations. She had an event monitor placed by [facility] which showed frequent PVCs 24% burden. Brief runs of SVT. Patient denies any chest pain or dyspnea. States she is active. No dizziness or syncope. She states she is intolerant of statins and Zetia due to leg cramps. She is no longer smoking.     Past Medical History:  Diagnosis Date   Aortic atherosclerosis (HCC)    Asthma    Carotid artery disease    Carotid US  11/2019: R 1-39; L 60-79   Carotid stenosis    Chronic obstructive pulmonary disease    Coronary artery disease    s/p NSTEMI 11/2019 >> s/p CABG // Echo 2.14.2021: EF 50-55, Gr 1 DD, RVSP 31.9   Diabetes (HCC)    Gout    History of cigarette smoking    History of pulmonary embolism    Pt notes episode in her 51s // Dx with RLL PE at time of NSTEMI in 11/2019 >> Apixaban    HLD (hyperlipidemia)    Hypertension    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)    NSVT (nonsustained ventricular tachycardia) (HCC)    Obese    Osteochondral lesion of talar dome    Osteopenia    Pneumonia 11/2019   PVC (premature ventricular  contraction)    Skin mass    Stage B ACC/AHA asymptomatic heart failure (HCC)    SVT (supraventricular tachycardia) (HCC)    Vitamin D deficiency     Past Surgical History:  Procedure Laterality Date   ABDOMINAL HYSTERECTOMY     CORONARY ARTERY BYPASS GRAFT N/A 11/29/2019   Procedure: CORONARY ARTERY BYPASS GRAFTING (CABG) using the LIMA to LAD; RIMA to PL; Endoscopic harvested right greater saphenous vein: SVC to Diag; SVC to OM1.;  Surgeon: Rudine Cos, MD;  Location: [facility] OR;  Service: Open Heart Surgery;  Laterality: N/A;  bilateral IMA   ENDOVEIN HARVEST OF GREATER SAPHENOUS VEIN Right 11/29/2019   Procedure: Astrid Blamer Of Greater Saphenous Vein;  Surgeon: Rudine Cos, MD;  Location: [facility] OR;  Service: Open Heart Surgery;  Laterality: Right;   LEFT HEART CATH AND CORONARY ANGIOGRAPHY N/A 11/27/2019   Procedure: LEFT HEART CATH AND CORONARY ANGIOGRAPHY;  Surgeon: [place], Iona Stay M, MD;  Location: [facility] INVASIVE CV LAB;  Service: Cardiovascular;  Laterality: N/A;   TEE WITHOUT CARDIOVERSION N/A 11/29/2019   Procedure: TRANSESOPHAGEAL ECHOCARDIOGRAM (TEE);  Surgeon: Rudine Cos, MD;  Location: [facility] OR;  Service: Open Heart Surgery;  Laterality: N/A;    Current Medications: Current Meds  Medication Sig   acetaminophen  (TYLENOL ) 500 MG tablet Take 1,000 mg by mouth every 6 (six) hours as needed for moderate pain or headache.   albuterol  (VENTOLIN  HFA) 108 (90 Base) MCG/ACT inhaler Inhale 2 puffs into the lungs every 4 (four) hours as needed for wheezing or shortness of breath.    aspirin  EC 81 MG tablet Take 1 tablet (81 mg total) by mouth daily.   colchicine  0.6 MG tablet Take 0.6 mg by mouth daily as needed (gout flares).   Fluticasone-Salmeterol (ADVAIR) 100-50 MCG/DOSE AEPB Inhale 1 puff into the lungs daily as needed (SOB).    indomethacin  (INDOCIN ) 25 MG capsule Take 1 capsule (25 mg total) by mouth 3 (three) times daily as needed.   losartan-hydrochlorothiazide (HYZAAR)  50-12.5 MG tablet Take 1 tablet by mouth daily.   metoprolol  succinate (TOPROL  XL) 25 MG 24 hr tablet Take 1 tablet (25 mg total) by mouth daily.   vitamin B-12 (CYANOCOBALAMIN) 1000 MCG tablet Take 1,000 mcg by mouth daily.     Allergies:   Patient has no known allergies.   Social History   Socioeconomic History   Marital status: Single    Spouse name: Not on file   Number of children: Not on file   Years of education: Not on file   Highest education level: Not on file  Occupational History   Not on file  Tobacco Use   Smoking status: Former    Types: Cigarettes   Smokeless tobacco: Never  Vaping Use   Vaping status: Never Used  Substance and Sexual Activity   Alcohol use: Yes    Alcohol/week: 0.0 standard drinks of alcohol    Comment: ocaasional   Drug use: Never   Sexual activity: Not on file  Other Topics Concern   Not on file  Social History Narrative   Not on file   Social Drivers of Health   Financial Resource Strain: Not on file  Food Insecurity: Not on file  Transportation Needs: Not on file  Physical Activity: Not on file  Stress: Not on file  Social Connections: Not on file     Family History: The patient's family history includes Coronary artery disease in her mother.  ROS:   Please see the history of present illness.     All other systems reviewed and are negative.  EKGs/Labs/Other Studies Reviewed:    The following studies were reviewed today: EKG Interpretation Date/Time:  Wednesday Feb 23 2024 14:55:30 EDT Ventricular Rate:  74 PR Interval:  166 QRS Duration:  92 QT Interval:  438 QTC Calculation: 486 R Axis:   55  Text Interpretation: Sinus rhythm with frequent and consecutive Premature ventricular complexes Nonspecific T wave abnormality When compared with ECG of 17-Oct-2021 13:00, Premature ventricular complexes are now Present Confirmed by [place], Floride Hutmacher [phone]) on 02/23/2024 3:00:23 PM   EKG Interpretation Date/Time:  Wednesday Feb 23 2024 14:55:30 EDT Ventricular Rate:  74 PR Interval:  166 QRS Duration:  92 QT Interval:  438 QTC Calculation: 486 R Axis:   55  Text Interpretation: Sinus rhythm with frequent and consecutive Premature ventricular complexes Nonspecific T wave abnormality When compared with ECG of 17-Oct-2021 13:00, Premature ventricular complexes are now Present Confirmed by [place], Garvin Ellena [phone]) on 02/23/2024 3:00:23 PM    Recent Labs: No results found for requested labs within last 365 days.  Recent Lipid Panel    Component Value Date/Time   CHOL 187 05/14/2020 0759   TRIG 125 05/14/2020 0759  HDL 75 05/14/2020 0759   CHOLHDL 2.5 05/14/2020 0759   CHOLHDL 4.2 11/26/2019 0607   VLDL 12 11/26/2019 0607   LDLCALC 90 05/14/2020 0759     Risk Assessment/Calculations:                Physical Exam:    VS:  BP 136/73   Pulse 74   Ht 5\' 8"  (1.727 m)   Wt 197 lb (89.4 kg)   SpO2 98%   BMI 29.95 kg/m     Wt Readings from Last 3 Encounters:  02/23/24 197 lb (89.4 kg)  11/13/20 185 lb (83.9 kg)  03/07/20 180 lb 3.2 oz (81.7 kg)     GEN:  Well nourished, well developed in no acute distress HEENT: Normal NECK: No JVD; No carotid bruits LYMPHATICS: No lymphadenopathy CARDIAC: RRR frequent ectopy, no murmurs, rubs, gallops RESPIRATORY:  Clear to auscultation without rales, wheezing or rhonchi  ABDOMEN: Soft, non-tender, non-distended MUSCULOSKELETAL:  No edema; No deformity  SKIN: Warm and dry NEUROLOGIC:  Alert and oriented x 3 PSYCHIATRIC:  Normal affect   ASSESSMENT:    1. Coronary artery disease of bypass graft of native heart with stable angina pectoris (HCC)   2. Hypertension, unspecified type   3. S/P CABG x 4   4. Mixed hyperlipidemia   5. PVC's (premature ventricular contractions)    PLAN:    In order of problems listed above:  CAD s/p CABG in 2021. No significant angina.  Frequent PVCs. Mildly symptomatic. Request copy of last lab work from PCP. Will add Toprol   XL 25 mg daily. Check Echo Hypercholesterolemia. LDL 173 in 2023. Request most recent lab results. Intolerant of statins and Zetia. Will refer to Pharm D for Repatha vs Leqvio HTN  under fair control History of PE. No longer on anticoagulation.      Follow up in 3 months with APP      Medication Adjustments/Labs and Tests Ordered: Current medicines are reviewed at length with the patient today.  Concerns regarding medicines are outlined above.  Orders Placed This Encounter  Procedures   AMB Referral to [facility] Pharm-D   EKG 12-Lead   ECHOCARDIOGRAM COMPLETE   Meds ordered this encounter  Medications   metoprolol  succinate (TOPROL  XL) 25 MG 24 hr tablet    Sig: Take 1 tablet (25 mg total) by mouth daily.    Dispense:  90 tablet    Refill:  3    There are no Patient Instructions on file for this visit.   Signed, Yehudit Fulginiti [place], MD  02/23/2024 3:22 PM    [facility]

## 2024-02-23 ENCOUNTER — Other Ambulatory Visit (HOSPITAL_COMMUNITY): Payer: Self-pay

## 2024-02-23 ENCOUNTER — Ambulatory Visit: Attending: Cardiology | Admitting: Cardiology

## 2024-02-23 ENCOUNTER — Encounter: Payer: Self-pay | Admitting: Cardiology

## 2024-02-23 VITALS — BP 136/73 | HR 74 | Ht 68.0 in | Wt 197.0 lb

## 2024-02-23 DIAGNOSIS — I10 Essential (primary) hypertension: Secondary | ICD-10-CM

## 2024-02-23 DIAGNOSIS — I493 Ventricular premature depolarization: Secondary | ICD-10-CM

## 2024-02-23 DIAGNOSIS — Z951 Presence of aortocoronary bypass graft: Secondary | ICD-10-CM | POA: Diagnosis not present

## 2024-02-23 DIAGNOSIS — I25708 Atherosclerosis of coronary artery bypass graft(s), unspecified, with other forms of angina pectoris: Secondary | ICD-10-CM

## 2024-02-23 DIAGNOSIS — E782 Mixed hyperlipidemia: Secondary | ICD-10-CM | POA: Diagnosis not present

## 2024-02-23 MED ORDER — METOPROLOL SUCCINATE ER 25 MG PO TB24
25.0000 mg | ORAL_TABLET | Freq: Every day | ORAL | 3 refills | Status: DC
  Filled 2024-02-23: qty 90, 90d supply, fill #0

## 2024-02-23 NOTE — Patient Instructions (Signed)
 Medication Instructions:  Start Toprol  XL 25 mg daily  Continue all other medications *If you need a refill on your cardiac medications before your next appointment, please call your pharmacy*  Lab Work: Labs requested from PCP  Testing/Procedures: Echo  first available  Follow-Up: At [facility], you and your health needs are our priority.  As part of our continuing mission to provide you with exceptional heart care, our providers are all part of one team.  This team includes your primary Cardiologist (physician) and Advanced Practice Providers or APPs (Physician Assistants and Nurse Practitioners) who all work together to provide you with the care you need, when you need it.  Your next appointment:  3 months    Provider:  Marlana Silvan NP  Schedule appointment with Pharm D in [facility]id Clinic  Schedule same day as echo if you can   We recommend signing up for the patient portal called "MyChart".  Sign up information is provided on this After Visit Summary.  MyChart is used to connect with patients for Virtual Visits (Telemedicine).  Patients are able to view lab/test results, encounter notes, upcoming appointments, etc.  Non-urgent messages can be sent to your provider as well.   To learn more about what you can do with MyChart, go to [email or web address].

## 2024-04-09 ENCOUNTER — Other Ambulatory Visit: Payer: Self-pay

## 2024-04-09 ENCOUNTER — Emergency Department (HOSPITAL_COMMUNITY)

## 2024-04-09 ENCOUNTER — Ambulatory Visit (HOSPITAL_COMMUNITY): Admission: EM | Admit: 2024-04-09 | Discharge: 2024-04-09 | Disposition: A | Discharge status: Home / Self Care

## 2024-04-09 ENCOUNTER — Encounter (HOSPITAL_COMMUNITY): Payer: Self-pay

## 2024-04-09 ENCOUNTER — Observation Stay (HOSPITAL_COMMUNITY)
Admission: EM | Admit: 2024-04-09 | Discharge: 2024-04-10 | Disposition: A | Discharge status: Home / Self Care | Attending: Family Medicine | Admitting: Family Medicine

## 2024-04-09 ENCOUNTER — Encounter (HOSPITAL_COMMUNITY): Payer: Self-pay | Admitting: Emergency Medicine

## 2024-04-09 DIAGNOSIS — I5020 Unspecified systolic (congestive) heart failure: Secondary | ICD-10-CM | POA: Insufficient documentation

## 2024-04-09 DIAGNOSIS — J322 Chronic ethmoidal sinusitis: Secondary | ICD-10-CM | POA: Diagnosis not present

## 2024-04-09 DIAGNOSIS — J449 Chronic obstructive pulmonary disease, unspecified: Secondary | ICD-10-CM | POA: Insufficient documentation

## 2024-04-09 DIAGNOSIS — I25810 Atherosclerosis of coronary artery bypass graft(s) without angina pectoris: Secondary | ICD-10-CM | POA: Diagnosis not present

## 2024-04-09 DIAGNOSIS — I639 Cerebral infarction, unspecified: Secondary | ICD-10-CM | POA: Diagnosis not present

## 2024-04-09 DIAGNOSIS — J984 Other disorders of lung: Secondary | ICD-10-CM | POA: Diagnosis not present

## 2024-04-09 DIAGNOSIS — I700 Atherosclerosis of aorta: Secondary | ICD-10-CM | POA: Diagnosis not present

## 2024-04-09 DIAGNOSIS — Z87891 Personal history of nicotine dependence: Secondary | ICD-10-CM | POA: Diagnosis not present

## 2024-04-09 DIAGNOSIS — I6381 Other cerebral infarction due to occlusion or stenosis of small artery: Principal | ICD-10-CM | POA: Insufficient documentation

## 2024-04-09 DIAGNOSIS — I10 Essential (primary) hypertension: Secondary | ICD-10-CM | POA: Diagnosis not present

## 2024-04-09 DIAGNOSIS — E785 Hyperlipidemia, unspecified: Secondary | ICD-10-CM | POA: Diagnosis not present

## 2024-04-09 DIAGNOSIS — Z86711 Personal history of pulmonary embolism: Secondary | ICD-10-CM | POA: Insufficient documentation

## 2024-04-09 DIAGNOSIS — G319 Degenerative disease of nervous system, unspecified: Secondary | ICD-10-CM | POA: Insufficient documentation

## 2024-04-09 DIAGNOSIS — I779 Disorder of arteries and arterioles, unspecified: Secondary | ICD-10-CM | POA: Diagnosis not present

## 2024-04-09 DIAGNOSIS — G459 Transient cerebral ischemic attack, unspecified: Secondary | ICD-10-CM | POA: Diagnosis not present

## 2024-04-09 DIAGNOSIS — E1165 Type 2 diabetes mellitus with hyperglycemia: Secondary | ICD-10-CM | POA: Diagnosis not present

## 2024-04-09 DIAGNOSIS — Z951 Presence of aortocoronary bypass graft: Secondary | ICD-10-CM | POA: Insufficient documentation

## 2024-04-09 DIAGNOSIS — I110 Hypertensive heart disease with heart failure: Secondary | ICD-10-CM | POA: Diagnosis not present

## 2024-04-09 DIAGNOSIS — J45909 Unspecified asthma, uncomplicated: Secondary | ICD-10-CM | POA: Diagnosis not present

## 2024-04-09 DIAGNOSIS — I2510 Atherosclerotic heart disease of native coronary artery without angina pectoris: Secondary | ICD-10-CM | POA: Insufficient documentation

## 2024-04-09 DIAGNOSIS — Z7982 Long term (current) use of aspirin: Secondary | ICD-10-CM | POA: Insufficient documentation

## 2024-04-09 DIAGNOSIS — I672 Cerebral atherosclerosis: Secondary | ICD-10-CM | POA: Insufficient documentation

## 2024-04-09 DIAGNOSIS — R4781 Slurred speech: Secondary | ICD-10-CM | POA: Diagnosis present

## 2024-04-09 DIAGNOSIS — E66811 Obesity, class 1: Secondary | ICD-10-CM | POA: Insufficient documentation

## 2024-04-09 DIAGNOSIS — N179 Acute kidney failure, unspecified: Secondary | ICD-10-CM | POA: Insufficient documentation

## 2024-04-09 DIAGNOSIS — E1151 Type 2 diabetes mellitus with diabetic peripheral angiopathy without gangrene: Secondary | ICD-10-CM | POA: Insufficient documentation

## 2024-04-09 DIAGNOSIS — I6523 Occlusion and stenosis of bilateral carotid arteries: Secondary | ICD-10-CM | POA: Insufficient documentation

## 2024-04-09 DIAGNOSIS — Z6830 Body mass index (BMI) 30.0-30.9, adult: Secondary | ICD-10-CM | POA: Insufficient documentation

## 2024-04-09 DIAGNOSIS — E119 Type 2 diabetes mellitus without complications: Secondary | ICD-10-CM | POA: Insufficient documentation

## 2024-04-09 HISTORY — DX: Cerebral infarction, unspecified: I63.9

## 2024-04-09 LAB — APTT: aPTT: 28 s (ref 24–36)

## 2024-04-09 LAB — PROTIME-INR
INR: 1 (ref 0.8–1.2)
Prothrombin Time: 13.6 s (ref 11.4–15.2)

## 2024-04-09 LAB — CBC
HCT: 37.2 % (ref 36.0–46.0)
Hemoglobin: 12.5 g/dL (ref 12.0–15.0)
MCH: 31.3 pg (ref 26.0–34.0)
MCHC: 33.6 g/dL (ref 30.0–36.0)
MCV: 93 fL (ref 80.0–100.0)
Platelets: 239 10*3/uL (ref 150–400)
RBC: 4 MIL/uL (ref 3.87–5.11)
RDW: 12.6 % (ref 11.5–15.5)
WBC: 7.4 10*3/uL (ref 4.0–10.5)
nRBC: 0 % (ref 0.0–0.2)

## 2024-04-09 LAB — DIFFERENTIAL
Abs Immature Granulocytes: 0.03 10*3/uL (ref 0.00–0.07)
Basophils Absolute: 0 10*3/uL (ref 0.0–0.1)
Basophils Relative: 1 %
Eosinophils Absolute: 0.1 10*3/uL (ref 0.0–0.5)
Eosinophils Relative: 2 %
Immature Granulocytes: 0 %
Lymphocytes Relative: 30 %
Lymphs Abs: 2.2 10*3/uL (ref 0.7–4.0)
Monocytes Absolute: 0.9 10*3/uL (ref 0.1–1.0)
Monocytes Relative: 12 %
Neutro Abs: 4.2 10*3/uL (ref 1.7–7.7)
Neutrophils Relative %: 55 %

## 2024-04-09 LAB — COMPREHENSIVE METABOLIC PANEL WITH GFR
ALT: 17 U/L (ref 0–44)
AST: 22 U/L (ref 15–41)
Albumin: 3.7 g/dL (ref 3.5–5.0)
Alkaline Phosphatase: 59 U/L (ref 38–126)
Anion gap: 11 (ref 5–15)
BUN: 23 mg/dL (ref 8–23)
CO2: 23 mmol/L (ref 22–32)
Calcium: 9.3 mg/dL (ref 8.9–10.3)
Chloride: 106 mmol/L (ref 98–111)
Creatinine, Ser: 1.25 mg/dL — ABNORMAL HIGH (ref 0.44–1.00)
GFR, Estimated: 46 mL/min — ABNORMAL LOW (ref >60)
Glucose, Bld: 142 mg/dL — ABNORMAL HIGH (ref 70–99)
Potassium: 3.7 mmol/L (ref 3.5–5.1)
Sodium: 140 mmol/L (ref 135–145)
Total Bilirubin: 0.4 mg/dL (ref 0.0–1.2)
Total Protein: 7.4 g/dL (ref 6.5–8.1)

## 2024-04-09 LAB — ETHANOL: Alcohol, Ethyl (B): <15 mg/dL (ref <15)

## 2024-04-09 MED ORDER — ASPIRIN 81 MG PO CHEW
81.0000 mg | CHEWABLE_TABLET | Freq: Once | ORAL | Status: AC
  Administered 2024-04-09: 81 mg via ORAL
  Filled 2024-04-09: qty 1

## 2024-04-09 MED ORDER — ASPIRIN 81 MG PO TBEC
81.0000 mg | DELAYED_RELEASE_TABLET | Freq: Every day | ORAL | Status: DC
  Administered 2024-04-10: 81 mg via ORAL
  Filled 2024-04-09: qty 1

## 2024-04-09 MED ORDER — STROKE: EARLY STAGES OF RECOVERY BOOK
Freq: Once | Status: AC
  Filled 2024-04-09: qty 1

## 2024-04-09 MED ORDER — IOHEXOL 350 MG/ML SOLN
75.0000 mL | Freq: Once | INTRAVENOUS | Status: AC | PRN
  Administered 2024-04-09: 75 mL via INTRAVENOUS

## 2024-04-09 MED ORDER — ACETAMINOPHEN 325 MG PO TABS: 650.0000 mg | ORAL_TABLET | ORAL | Status: DC | PRN

## 2024-04-09 MED ORDER — CLOPIDOGREL BISULFATE 75 MG PO TABS
75.0000 mg | ORAL_TABLET | Freq: Every day | ORAL | Status: DC
  Administered 2024-04-10: 75 mg via ORAL
  Filled 2024-04-09: qty 1

## 2024-04-09 MED ORDER — ACETAMINOPHEN 650 MG RE SUPP: 650.0000 mg | RECTAL | Status: DC | PRN

## 2024-04-09 MED ORDER — ENOXAPARIN SODIUM 40 MG/0.4ML IJ SOSY
40.0000 mg | PREFILLED_SYRINGE | Freq: Every day | INTRAMUSCULAR | Status: DC
  Administered 2024-04-10: 40 mg via SUBCUTANEOUS
  Filled 2024-04-09: qty 0.4

## 2024-04-09 MED ORDER — SODIUM CHLORIDE 0.9 % IV SOLN: INTRAVENOUS | Status: DC

## 2024-04-09 MED ORDER — CLOPIDOGREL BISULFATE 300 MG PO TABS
300.0000 mg | ORAL_TABLET | Freq: Once | ORAL | Status: AC
  Administered 2024-04-09: 300 mg via ORAL
  Filled 2024-04-09: qty 1

## 2024-04-09 MED ORDER — ACETAMINOPHEN 160 MG/5ML PO SOLN: 650.0000 mg | ORAL | Status: DC | PRN

## 2024-04-09 NOTE — ED Provider Notes (Addendum)
 [place] EMERGENCY DEPARTMENT AT [facility] Provider Note   CSN: [id number] Arrival date & time: 04/09/24  1744     Patient presents with: Aphasia   Miranda [name] is a 73 y.o. female.   HPI Patient presents from urgent care where she initially presented after developing speech difficulty and facial asymmetry.  Onset of those changes was about 1 hour ago.  She was rapidly brought to the ED from urgent care after she had her initial assessment.  Patient has a history of cardiac bypass, no history of stroke.  Since onset difficulty, facial asymmetry has improved.    Prior to Admission medications   Medication Sig Start Date End Date Taking? Authorizing Provider  acetaminophen  (TYLENOL ) 500 MG tablet Take 1,000 mg by mouth every 6 (six) hours as needed for moderate pain or headache.   Yes [provider]  albuterol  (VENTOLIN  HFA) 108 (90 Base) MCG/ACT inhaler Inhale 2 puffs into the lungs every 4 (four) hours as needed for wheezing or shortness of breath.  11/03/19  Yes [provider]  colchicine  0.6 MG tablet Take 0.6 mg by mouth daily as needed (gout flares). 06/30/21  Yes [provider]  indomethacin  (INDOCIN ) 25 MG capsule Take 1 capsule (25 mg total) by mouth 3 (three) times daily as needed. 10/17/21  Yes Khatri, Hina, PA-C  losartan-hydrochlorothiazide (HYZAAR) 50-12.5 MG tablet Take 1 tablet by mouth daily. 08/14/21  Yes [provider]  metoprolol  succinate (TOPROL  XL) 25 MG 24 hr tablet Take 1 tablet (25 mg total) by mouth daily. 02/23/24  Yes Swaziland, Peter M, MD    Allergies: Patient has no known allergies.    Review of Systems  Updated Vital Signs BP (!) 141/81   Pulse 65   Temp 98.2 F (36.8 C) (Oral)   Resp 18   Ht 5' 8 (1.727 m)   Wt 88.9 kg   SpO2 100%   BMI 29.80 kg/m   Physical Exam Vitals and nursing note reviewed.  Constitutional:      General: She is not in acute distress.    Appearance: She is  well-developed.  HENT:     Head: Normocephalic and atraumatic.   Eyes:     Conjunctiva/sclera: Conjunctivae normal.    Cardiovascular:     Rate and Rhythm: Normal rate and regular rhythm.  Pulmonary:     Effort: Pulmonary effort is normal. No respiratory distress.     Breath sounds: Normal breath sounds. No stridor.  Abdominal:     General: There is no distension.   Skin:    General: Skin is warm and dry.   Neurological:     General: No focal deficit present.     Mental Status: She is alert and oriented to person, place, and time.     Cranial Nerves: No cranial nerve deficit.     Motor: No weakness.     Coordination: Coordination normal.   Psychiatric:        Mood and Affect: Mood normal.     (all labs ordered are listed, but only abnormal results are displayed) Labs Reviewed  COMPREHENSIVE METABOLIC PANEL WITH GFR - Abnormal; Notable for the following components:      Result Value   Glucose, Bld 142 (*)    Creatinine, Ser 1.25 (*)    GFR, Estimated 46 (*)    All other components within normal limits  PROTIME-INR  APTT  CBC  DIFFERENTIAL  ETHANOL  I-STAT CHEM 8, ED  CBG  MONITORING, ED    EKG: EKG Interpretation Date/Time:  Sunday April 09 2024 18:31:58 EDT Ventricular Rate:  74 PR Interval:  178 QRS Duration:  100 QT Interval:  413 QTC Calculation: 459 R Axis:   84  Text Interpretation: Sinus rhythm Multiple ventricular premature complexes Borderline right axis deviation Borderline T abnormalities, lateral leads Confirmed by Garrick Charleston [phone]) on 04/09/2024 8:52:23 PM  Radiology: CT ANGIO HEAD NECK W WO CM (CODE STROKE) Result Date: 04/09/2024 CLINICAL DATA:  Provided history: Neuro deficit, acute, stroke suspected. Additional history obtained from electronic MEDICAL RECORD NUMBERSlurred speech, facial asymmetry EXAM: CT ANGIOGRAPHY HEAD AND NECK WITH AND WITHOUT CONTRAST TECHNIQUE: Multidetector CT imaging of the head and neck was performed using the  standard protocol during bolus administration of intravenous contrast. Multiplanar CT image reconstructions and MIPs were obtained to evaluate the vascular anatomy. Carotid stenosis measurements (when applicable) are obtained utilizing NASCET criteria, using the distal internal carotid diameter as the denominator. RADIATION DOSE REDUCTION: This exam was performed according to the departmental dose-optimization program which includes automated exposure control, adjustment of the mA and/or kV according to patient size and/or use of iterative reconstruction technique. CONTRAST:  75mL OMNIPAQUE  IOHEXOL  350 MG/ML SOLN COMPARISON:  Non-contrast head CT performed earlier today 04/09/2024. Chest CT 11/26/2019. FINDINGS: CTA NECK FINDINGS Aortic arch: Common origin of the innominate and left common carotid arteries. Atherosclerotic plaque within the visualized thoracic aorta and proximal major branch vessels of the neck. Right carotid system: CCA and ICA patent within the neck without hemodynamically significant stenosis (50% or greater). Atherosclerotic plaque scattered within the common carotid artery, about the carotid bifurcation and within the proximal ICA resulting in less than 50% stenosis. Focus of ulcerated plaque at the carotid bulb with defect measuring 4 mm (series 9, image 131). Left carotid system: CCA and ICA patent within the neck. Atherosclerotic plaque. Most notably, calcified and noncalcified plaque within the distal common carotid artery results in a severe (greater than 80%) stenosis (for instance as seen on series 5, image 217). Vertebral arteries: Patent within the neck without stenosis. Nonstenotic calcified plaque within the left vertebral artery V3 segment. Skeleton: Poor dentition.  Cervical spondylosis. Other neck: 10 mm nodule within the left thyroid lobe not meeting consensus criteria for ultrasound follow-up based on size. No follow-up imaging recommended. Reference: J Am Coll Radiol. 2015  Feb;12(2): 143-50. Upper chest: Foci of nodular and bandlike scarring within the bilateral lungs at the imaged levels. Prior median sternotomy. Review of the MIP images confirms the above findings CTA HEAD FINDINGS Anterior circulation: The intracranial internal carotid arteries are patent. Atherosclerotic plaque with sites of moderate stenosis within the intracranial internal carotid arteries, bilaterally. The M1 middle cerebral arteries are patent. No M2 proximal branch occlusion or high-grade proximal stenosis. The anterior cerebral arteries are patent. Moderate-to-severe stenosis within the left anterior cerebral artery at the A2/A3 junction. No intracranial aneurysm is identified. Posterior circulation: The intracranial vertebral arteries are patent. The non-dominant right vertebral artery terminates predominantly as the right PICA (with only a small contribution to the basilar artery). Nonstenotic atherosclerotic plaque within the proximal left V4 segment. The left anterior inferior cerebellar artery (AICA) appears to supply both the left AICA and PICA territories. The basilar artery is patent. The posterior cerebral arteries are patent. The left PCA is fetal in origin. The right posterior communicating artery is diminutive or absent. Venous sinuses: Within the limitations of contrast timing, no convincing thrombus. Anatomic variants: As described. Review of the MIP images  confirms the above findings No proximal intracranial large vessel occlusion identified. These results were communicated to Dr. Voncile at 7:12 pmon 6/29/2025by text page via the [facility] messaging system. IMPRESSION: CTA neck: 1. The common carotid and internal carotid arteries are patent in the neck. Atherosclerotic plaque bilaterally as described. Most notably, calcified and noncalcified plaque results in a severe (estimated greater than 80%) stenosis of the distal left common carotid artery. Additionally, there is moderate atherosclerotic  plaque within the distal right common carotid artery and about the right carotid bifurcation (with a focus of ulcerated plaque at this site). 2. Vertebral arteries patent within the neck without stenosis. 3. Aortic Atherosclerosis (ICD10-I70.0). 4. Foci of bandlike and nodular pulmonary scarring within the bilateral lungs at the imaged levels. Consider a dedicated chest CT for further evaluation. CTA head: 1. No proximal intracranial large vessel occlusion identified. 2. Intracranial atherosclerotic disease most notably as follows. 3. Sites of moderate stenosis within the bilateral intracranial internal carotid arteries. 4. Moderate-to-severe stenosis within the left anterior cerebral artery at the A2/A3 junction. Electronically Signed   By: Rockey Childs D.O.   On: 04/09/2024 19:13   CT HEAD CODE STROKE WO CONTRAST Result Date: 04/09/2024 CLINICAL DATA:  Code stroke. Provided history: Neuro deficit, acute, stroke suspected. EXAM: CT HEAD WITHOUT CONTRAST TECHNIQUE: Contiguous axial images were obtained from the base of the skull through the vertex without intravenous contrast. RADIATION DOSE REDUCTION: This exam was performed according to the departmental dose-optimization program which includes automated exposure control, adjustment of the mA and/or kV according to patient size and/or use of iterative reconstruction technique. COMPARISON:  None. FINDINGS: Brain: Mild generalized cerebral atrophy. There is no acute intracranial hemorrhage. No demarcated cortical infarct. No extra-axial fluid collection. No evidence of an intracranial mass. No midline shift. Vascular: No hyperdense vessel.  Atherosclerotic calcifications. Skull: No calvarial fracture or aggressive osseous lesion. Sinuses/Orbits: No mass or acute finding within the imaged orbits. Small-volume frothy secretions within a posterior left ethmoid air cell. ASPECTS [facility] Stroke Program Early CT Score) - Ganglionic level infarction (caudate, lentiform  nuclei, internal capsule, insula, M1-M3 cortex): 7 - Supraganglionic infarction (M4-M6 cortex): 3 Total score (0-10 with 10 being normal): 10 No evidence of an acute intracranial abnormality. These results were communicated to Dr. Voncile at 6:35 pmon 6/29/2025by text page via the [facility] messaging system. IMPRESSION: 1.  No evidence of an acute intracranial abnormality. 2. Mild generalized cerebral atrophy. 3. Mild left ethmoid sinusitis. Electronically Signed   By: Rockey Childs D.O.   On: 04/09/2024 18:36     Procedures   Medications Ordered in the ED  iohexol  (OMNIPAQUE ) 350 MG/ML injection 75 mL (75 mLs Intravenous Contrast Given 04/09/24 1829)  aspirin  chewable tablet 81 mg (81 mg Oral Given 04/09/24 1930)  clopidogrel  (PLAVIX ) tablet 300 mg (300 mg Oral Given 04/09/24 2017)                                    Medical Decision Making Adult female with risk profile for CVA including cardiac disease, hypertension now presents after sudden onset speech difficulty facial asymmetry.  Concern for TIA/CVA.  Symptoms less likely Bell's palsy, but patient does not have obvious extremity weakness initially.  Case comanaged with our neurology colleagues, and I evaluated the patient initially in the CT scanner booth, reviewed the images.   Amount and/or Complexity of Data Reviewed External Data Reviewed: notes. Labs: ordered. Decision-making  details documented in ED Course. Radiology: ordered and independent interpretation performed. Decision-making details documented in ED Course. ECG/medicine tests: independent interpretation performed. Decision-making details documented in ED Course.  Risk OTC drugs.   8:52 PM CT, CTA results notable for 80% stenosis with some ulceration.  I discussed this with her neurology colleague.  Though the patient's symptoms have improved, concern for symptomatic stenosis, patient is going for MRI, will require admission for further monitoring, management.     Final  diagnoses:  Slurred speech  Bilateral carotid artery stenosis  Acute CVA (cerebrovascular accident) [facility])     Garrick Charleston, MD 04/09/24 2053    Garrick Charleston, MD 04/09/24 2216

## 2024-04-09 NOTE — Consult Note (Signed)
 NEUROLOGY CONSULT NOTE   Date of service: April 09, 2024 Patient Name: Miranda [name] MRN:  [id number] DOB:  August 22, 1951 Chief Complaint: Slurred speech and left face twisting Requesting Provider: Garrick Charleston, MD  History of Present Illness  Miranda [name] is a 73 y.o. female with hx of coronary artery disease status post CABG, hypertension, hyperlipidemia intolerant to statins presenting for evaluation of sudden onset of slurred speech and left facial droop that was noted by her and family while she was on phone with them on FaceTime.  Symptoms lasted about 20 minutes.  She came to the ER where a code stroke was activated.  On my examination, she did have some dysarthria initially but no facial asymmetry.  CT head was unremarkable.  CTA head and neck with no LVO but she has calcified and noncalcified plaque and severe stenosis in the left distal common carotid artery and additional moderate atherosclerotic plaque within the distal right common carotid artery and about the right carotid bifurcation with a focus of ulcerated plaque at the site.  LKW: 1720 hrs. Modified rankin score: 0-Completely asymptomatic and back to baseline post- stroke IV Thrombolysis: Too mild to treat EVT: No ELVO NIH scale 1 for dysarthria Repeat examination around 7:30 PM-NIH 0.  If she has symptoms again, please reactivate code stroke   ROS  Comprehensive ROS performed and pertinent positives documented in HPI   Past History   Past Medical History:  Diagnosis Date   Aortic atherosclerosis (HCC)    Asthma    Carotid artery disease    Carotid US  11/2019: R 1-39; L 60-79   Carotid stenosis    Chronic obstructive pulmonary disease    Coronary artery disease    s/p NSTEMI 11/2019 >> s/p CABG // Echo 2.14.2021: EF 50-55, Gr 1 DD, RVSP 31.9   Diabetes (HCC)    Gout    History of cigarette smoking    History of pulmonary embolism    Pt notes episode in her 21s // Dx with RLL PE at time of NSTEMI in 11/2019 >>  Apixaban    HLD (hyperlipidemia)    Hypertension    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)    NSVT (nonsustained ventricular tachycardia) (HCC)    Obese    Osteochondral lesion of talar dome    Osteopenia    Pneumonia 11/2019   PVC (premature ventricular contraction)    Skin mass    Stage B ACC/AHA asymptomatic heart failure (HCC)    SVT (supraventricular tachycardia) (HCC)    Vitamin D deficiency     Past Surgical History:  Procedure Laterality Date   ABDOMINAL HYSTERECTOMY     CORONARY ARTERY BYPASS GRAFT N/A 11/29/2019   Procedure: CORONARY ARTERY BYPASS GRAFTING (CABG) using the LIMA to LAD; RIMA to PL; Endoscopic harvested right greater saphenous vein: SVC to Diag; SVC to OM1.;  Surgeon: German Bartlett PEDLAR, MD;  Location: [facility] OR;  Service: Open Heart Surgery;  Laterality: N/A;  bilateral IMA   ENDOVEIN HARVEST OF GREATER SAPHENOUS VEIN Right 11/29/2019   Procedure: Jethro Rubins Of Greater Saphenous Vein;  Surgeon: German Bartlett PEDLAR, MD;  Location: [facility] OR;  Service: Open Heart Surgery;  Laterality: Right;   LEFT HEART CATH AND CORONARY ANGIOGRAPHY N/A 11/27/2019   Procedure: LEFT HEART CATH AND CORONARY ANGIOGRAPHY;  Surgeon: Swaziland, Peter M, MD;  Location: [facility] INVASIVE CV LAB;  Service: Cardiovascular;  Laterality: N/A;   TEE WITHOUT CARDIOVERSION N/A 11/29/2019   Procedure: TRANSESOPHAGEAL ECHOCARDIOGRAM (  TEE);  Surgeon: German Bartlett PEDLAR, MD;  Location: [facility] OR;  Service: Open Heart Surgery;  Laterality: N/A;    Family History: Family History  Problem Relation Age of Onset   Coronary artery disease Mother     Social History  reports that she has quit smoking. Her smoking use included cigarettes. She has never used smokeless tobacco. She reports current alcohol use. She reports that she does not use drugs.  No Known Allergies  Medications  No current facility-administered medications for this encounter.  Current Outpatient Medications:     acetaminophen  (TYLENOL ) 500 MG tablet, Take 1,000 mg by mouth every 6 (six) hours as needed for moderate pain or headache., Disp: , Rfl:    albuterol  (VENTOLIN  HFA) 108 (90 Base) MCG/ACT inhaler, Inhale 2 puffs into the lungs every 4 (four) hours as needed for wheezing or shortness of breath. , Disp: , Rfl:    colchicine  0.6 MG tablet, Take 0.6 mg by mouth daily as needed (gout flares)., Disp: , Rfl:    indomethacin  (INDOCIN ) 25 MG capsule, Take 1 capsule (25 mg total) by mouth 3 (three) times daily as needed., Disp: 20 capsule, Rfl: 0   losartan-hydrochlorothiazide (HYZAAR) 50-12.5 MG tablet, Take 1 tablet by mouth daily., Disp: , Rfl:    metoprolol  succinate (TOPROL  XL) 25 MG 24 hr tablet, Take 1 tablet (25 mg total) by mouth daily., Disp: 90 tablet, Rfl: 3  Vitals   Vitals:   May 06, 2024 1749 05-06-24 1808 05-06-24 1832 May 06, 2024 1835  BP: (!) 157/95  (!) 155/83   Pulse: 87   71  Resp: 18  (!) 22 20  Temp: 98.2 F (36.8 C)     TempSrc: Oral     SpO2: 100%   100%  Weight:  88.9 kg    Height:  5' 8 (1.727 m)      Body mass index is 29.8 kg/m.   Physical Exam   General: Awake alert in no distress HEENT: Normocephalic atraumatic Lungs: Clear Neurologic exam Awake and alert and x 3 Mild dysarthria initially that has since resolved No evidence of aphasia Cranial nerves II to XII intact Motor examination with no drift Sensory exam intact without extinction Coordination examination with no dysmetria  Labs/Imaging/Neurodiagnostic studies   CBC:  Recent Labs  Lab 05/06/2024 1801  WBC 7.4  NEUTROABS 4.2  HGB 12.5  HCT 37.2  MCV 93.0  PLT 239   Basic Metabolic Panel:  Lab Results  Component Value Date   NA 140 05-06-2024   K 3.7 May 06, 2024   CO2 23 05-06-24   GLUCOSE 142 (H) 05-06-2024   BUN 23 06-May-2024   CREATININE 1.25 (H) 2024-05-06   CALCIUM  9.3 05/06/2024   GFRNONAA 46 (L) 05/06/24   GFRAA 77 02/06/2020   Lipid Panel:  Lab Results  Component Value Date    LDLCALC 90 05/14/2020   HgbA1c:  Lab Results  Component Value Date   HGBA1C 6.9 (H) 11/28/2019   Urine Drug Screen: No results found for: LABOPIA, COCAINSCRNUR, LABBENZ, AMPHETMU, THCU, LABBARB  Alcohol Level     Component Value Date/Time   [facility] <15 06-May-2024 1801   INR  Lab Results  Component Value Date   INR 1.0 05/06/24   APTT  Lab Results  Component Value Date   APTT 28 05-06-2024   CT Head without contrast(Personally reviewed): No acute changes  CT angio Head and Neck with contrast(Personally reviewed): No ELVO.  Atherosclerotic plaque most notably in severe stenosis 80% of the distal  left common carotid artery.  Moderate atherosclerotic plaque within the distal right common carotid about the right carotid bifurcation with a focus of ulcerated plaque.  Vertebral arteries patent without stenosis.  CT angiography head reveals moderate stenosis in the bilateral intracranial ICAs.  Moderate to severe stenosis within the left anterior cerebral artery at the A2 A3 junction  ASSESSMENT   BORA BOST is a 73 y.o. female with past medical history as above presenting for 20-minute episode of left facial weakness and dysarthria which have both since resolved.  Symptoms totally lasted for about over an hour and she now feels back to baseline. Her CT head is unremarkable. No TNK due to low stroke scale Her CT angiography head and neck shows multifocal stenosis in the neck and head most notably moderate stenosis in the right carotid with some plaque ulceration at the right carotid bifurcation as well as 80% stenosis of the left common carotid artery. Her presentation could have been secondary to a right hemispheric TIA and I would recommend further workup.  Impression: Right hemispheric TIA  RECOMMENDATIONS  Admit to hospitalist Frequent neurochecks Telemetry 2D echo A1c Lipid panel Loaded with aspirin  and Plavix  Continue aspirin  81+ Plavix  75 High  intensity statin would be ideal but she is intolerant to statins and her cardiologist recommended her for Repatha or Leqvio-this should be done outpatient. PT, OT, speech therapy Allow permissive hypertension-treat only if systolic blood pressures greater than 220 on apparent basis Stroke team to follow Plan relayed to Dr. Garrick ______________________________________________________________________    Signed, Eligio Lav, MD [facility]

## 2024-04-09 NOTE — ED Notes (Signed)
 Clint Search, PA notified and said for the patient to go the ED.

## 2024-04-09 NOTE — ED Notes (Signed)
 Patient is being discharged from the Urgent Care and sent to the Emergency Department via POV . Per Wes Search, PA, patient is in need of higher level of care due to possible stssrofke. Patient is aware and verbalizes understanding of plan of care.  Vitals:   04/09/24 1727  BP: (!) 165/90  Pulse: 84  Resp: 18  Temp: 98 F (36.7 C)  SpO2: 97%

## 2024-04-09 NOTE — ED Triage Notes (Signed)
 Patient arrives ambulatory by POV sent by UC for stroke evaluation. Patient states she was facetiming her sister when her sister noticed her face was twisting to the left and that she was having slurred speech. Describes her left eye as feeling funny. Reports feeling tired and weak all over. States it took her a lot longer to walk here than it usually would.

## 2024-04-09 NOTE — ED Provider Triage Note (Signed)
 Emergency Medicine Provider Triage Evaluation Note  Miranda [name] , a 73 y.o. female  was evaluated in triage.  Pt complains of slurred speech, facial asymmetry which began while she was in a FaceTime call with her sister around 33.  Felt overall weird feeling , states the symptoms have now resolved.  She went to urgent care and sent here for further evaluation of symptoms.  She does have some residual slurred speech.  Extensive medical history including hypertension, lipidemia, prior heart cath.  She does continue to endorse that her left eye feels really weird , like it wants to close   Review of Systems  Positive: Slurred speech Negative: Headache, cp, sob  Physical Exam  BP (!) 157/95 (BP Location: Right Arm)   Pulse 87   Temp 98.2 F (36.8 C) (Oral)   Resp 18   SpO2 100%  Gen:   Awake, no distress   Resp:  Normal effort  MSK:   Moves extremities without difficulty  Other:  Slight slurred speech, no weakness to arms or legs  Medical Decision Making  Medically screening exam initiated at 6:05 PM.  Appropriate orders placed.  Miranda [name] was informed that the remainder of the evaluation will be completed by another provider, this initial triage assessment does not replace that evaluation, and the importance of remaining in the ED until their evaluation is complete.     Miranda Dickerson, PA-C 04/09/24 1806

## 2024-04-09 NOTE — ED Notes (Signed)
 CCMD contacted

## 2024-04-09 NOTE — ED Triage Notes (Signed)
 Patient states she had twisting of the left side of mouth and slurred speech that lasted approx a few minutes.  Incident occurred 20 minutes ago.   Patient states she is feeling normal at this time. No slurring of words. No deficits noted.

## 2024-04-09 NOTE — H&P (Signed)
 History and Physical    DAEJA [name] FMW:[id number] DOB: 07-Jun-1951 DOA: 04/09/2024  Patient coming from: Home.  Chief Complaint: Slurred speech and left-sided facial droop.  HPI: Miranda [name] is a 73 y.o. female with history of CAD status post CABG, hypertension, hyperlipidemia prior history of PE, history of intolerance to statins presents to the ER with complaints of slurred speech and left-sided facial droop.  As per the report patient was with her family on the phone when family noticed that patient had slurred speech and left facial droop.  Lasted for 20 minutes. Happened around 5:20 PM this evening.  Patient states her symptoms resolved by the time she came to the ER.  Denies any weakness of the extremities or any visual symptoms of difficulty speaking.  ED Course: In the ER patient had CT head followed by CT angiogram head and neck and MRI of the brain.  MRI of the brain shows early acute or subacute stroke in the left frontal white matter.  CT angiogram of the head and neck shows multifocal stenosis in the neck and head most notably moderate stenosis in the right carotid with some plaque ulceration and also 80% stenosis of the left common carotid artery.  Patient was evaluated by neurologist and started on aspirin  Plavix  and admitted for further workup.  Labs show mildly elevated creatinine from baseline.  EKG shows normal sinus rhythm.  Review of Systems: As per HPI, rest all negative.   Past Medical History:  Diagnosis Date   Aortic atherosclerosis (HCC)    Asthma    Carotid artery disease    Carotid US  11/2019: R 1-39; L 60-79   Carotid stenosis    Chronic obstructive pulmonary disease    Coronary artery disease    s/p NSTEMI 11/2019 >> s/p CABG // Echo 2.14.2021: EF 50-55, Gr 1 DD, RVSP 31.9   Diabetes (HCC)    Gout    History of cigarette smoking    History of pulmonary embolism    Pt notes episode in her 62s // Dx with RLL PE at time of NSTEMI in 11/2019 >> Apixaban     HLD (hyperlipidemia)    Hypertension    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)    NSVT (nonsustained ventricular tachycardia) (HCC)    Obese    Osteochondral lesion of talar dome    Osteopenia    Pneumonia 11/2019   PVC (premature ventricular contraction)    Skin mass    Stage B ACC/AHA asymptomatic heart failure (HCC)    SVT (supraventricular tachycardia) (HCC)    Vitamin D deficiency     Past Surgical History:  Procedure Laterality Date   ABDOMINAL HYSTERECTOMY     CORONARY ARTERY BYPASS GRAFT N/A 11/29/2019   Procedure: CORONARY ARTERY BYPASS GRAFTING (CABG) using the LIMA to LAD; RIMA to PL; Endoscopic harvested right greater saphenous vein: SVC to Diag; SVC to OM1.;  Surgeon: German Bartlett PEDLAR, MD;  Location: [facility] OR;  Service: Open Heart Surgery;  Laterality: N/A;  bilateral IMA   ENDOVEIN HARVEST OF GREATER SAPHENOUS VEIN Right 11/29/2019   Procedure: Jethro Rubins Of Greater Saphenous Vein;  Surgeon: German Bartlett PEDLAR, MD;  Location: [facility] OR;  Service: Open Heart Surgery;  Laterality: Right;   LEFT HEART CATH AND CORONARY ANGIOGRAPHY N/A 11/27/2019   Procedure: LEFT HEART CATH AND CORONARY ANGIOGRAPHY;  Surgeon: Swaziland, Peter M, MD;  Location: [facility] INVASIVE CV LAB;  Service: Cardiovascular;  Laterality: N/A;   TEE WITHOUT CARDIOVERSION  N/A 11/29/2019   Procedure: TRANSESOPHAGEAL ECHOCARDIOGRAM (TEE);  Surgeon: German Bartlett PEDLAR, MD;  Location: [facility] OR;  Service: Open Heart Surgery;  Laterality: N/A;     reports that she has quit smoking. Her smoking use included cigarettes. She has never used smokeless tobacco. She reports current alcohol use. She reports that she does not use drugs.  No Known Allergies  Family History  Problem Relation Age of Onset   Coronary artery disease Mother     Prior to Admission medications   Medication Sig Start Date End Date Taking? Authorizing Provider  acetaminophen  (TYLENOL ) 500 MG tablet Take 1,000 mg by mouth every 6  (six) hours as needed for moderate pain or headache.   Yes [provider]  albuterol  (VENTOLIN  HFA) 108 (90 Base) MCG/ACT inhaler Inhale 2 puffs into the lungs every 4 (four) hours as needed for wheezing or shortness of breath.  11/03/19  Yes [provider]  colchicine  0.6 MG tablet Take 0.6 mg by mouth daily as needed (gout flares). 06/30/21  Yes [provider]  indomethacin  (INDOCIN ) 25 MG capsule Take 1 capsule (25 mg total) by mouth 3 (three) times daily as needed. 10/17/21  Yes [name], Hina, PA-C  losartan-hydrochlorothiazide (HYZAAR) 50-12.5 MG tablet Take 1 tablet by mouth daily. 08/14/21  Yes [provider]  metoprolol  succinate (TOPROL  XL) 25 MG 24 hr tablet Take 1 tablet (25 mg total) by mouth daily. 02/23/24  Yes Swaziland, Peter M, MD    Physical Exam: Constitutional: Moderately built and nourished. Vitals:   04/09/24 1832 04/09/24 1835 04/09/24 2000 04/09/24 2208  BP: (!) 155/83  (!) 141/81   Pulse:  71 65   Resp: (!) 22 20 18    Temp:    98.1 F (36.7 C)  TempSrc:    Oral  SpO2:  100% 100%   Weight:      Height:       Eyes: Anicteric no pallor. ENMT: No discharge from the ears eyes nose and mouth. Neck: No mass felt.  No neck rigidity. Respiratory: No rhonchi or crepitations. Cardiovascular: S1-S2 heard. Abdomen: Soft nontender bowel sound present. Musculoskeletal: No edema. Skin: No rash. Neurologic: Alert awake oriented to time place and person.  Moves all extremities 5 x 5.  No facial asymmetry tongue is midline pupils equal and reactive to light. Psychiatric: Appears normal.  Normal affect.   Labs on Admission: I have personally reviewed following labs and imaging studies  CBC: Recent Labs  Lab 04/09/24 1801  WBC 7.4  NEUTROABS 4.2  HGB 12.5  HCT 37.2  MCV 93.0  PLT 239   Basic Metabolic Panel: Recent Labs  Lab 04/09/24 1801  NA 140  K 3.7  CL 106  CO2 23  GLUCOSE 142*  BUN 23  CREATININE 1.25*  CALCIUM  9.3    GFR: Estimated Creatinine Clearance: 47.5 mL/min (A) (by C-G formula based on SCr of 1.25 mg/dL (H)). Liver Function Tests: Recent Labs  Lab 04/09/24 1801  AST 22  ALT 17  ALKPHOS 59  BILITOT 0.4  PROT 7.4  ALBUMIN  3.7   No results for input(s): LIPASE, AMYLASE in the last 168 hours. No results for input(s): AMMONIA in the last 168 hours. Coagulation Profile: Recent Labs  Lab 04/09/24 1801  INR 1.0   Cardiac Enzymes: No results for input(s): CKTOTAL, CKMB, CKMBINDEX, TROPONINI in the last 168 hours. BNP (last 3 results) No results for input(s): PROBNP in the last 8760 hours. HbA1C: No results for input(s): HGBA1C in  the last 72 hours. CBG: No results for input(s): GLUCAP in the last 168 hours. Lipid Profile: No results for input(s): CHOL, HDL, LDLCALC, TRIG, CHOLHDL, LDLDIRECT in the last 72 hours. Thyroid Function Tests: No results for input(s): TSH, T4TOTAL, FREET4, T3FREE, THYROIDAB in the last 72 hours. Anemia Panel: No results for input(s): VITAMINB12, FOLATE, FERRITIN, TIBC, IRON, RETICCTPCT in the last 72 hours. Urine analysis:    Component Value Date/Time   COLORURINE YELLOW 11/28/2019 0426   APPEARANCEUR CLEAR 11/28/2019 0426   LABSPEC 1.033 (H) 11/28/2019 0426   PHURINE 5.0 11/28/2019 0426   GLUCOSEU NEGATIVE 11/28/2019 0426   HGBUR NEGATIVE 11/28/2019 0426   BILIRUBINUR NEGATIVE 11/28/2019 0426   BILIRUBINUR negative 07/17/2016 1342   KETONESUR NEGATIVE 11/28/2019 0426   PROTEINUR NEGATIVE 11/28/2019 0426   UROBILINOGEN 0.2 07/17/2016 1342   NITRITE NEGATIVE 11/28/2019 0426   LEUKOCYTESUR NEGATIVE 11/28/2019 0426   Sepsis Labs: @LABRCNTIP (procalcitonin:4,lacticidven:4) )No results found for this or any previous visit (from the past 240 hours).   Radiological Exams on Admission: MR BRAIN WO CONTRAST Result Date: 04/09/2024 CLINICAL DATA:  Neuro deficit, acute, stroke suspected EXAM: MRI HEAD  WITHOUT CONTRAST TECHNIQUE: Multiplanar, multiecho pulse sequences of the brain and surrounding structures were obtained without intravenous contrast. COMPARISON:  CT head from earlier today. FINDINGS: Brain: Small focus of mild restricted diffusion in the left posterior frontal white matter (for example see series 9, image 84 and series 10, image 33), compatible with acute or early subacute infarct. No significant edema or mass effect. Mild scattered T2/FLAIR hyperintensities in the white matter compatible with chronic microvascular ischemic change. No acute hemorrhage, hydrocephalus, extra-axial collection or mass lesion. Vascular: Normal flow voids. Skull and upper cervical spine: Normal marrow signal. Sinuses/Orbits: Clear sinuses.  No acute orbital findings. Other: No mastoid effusions. IMPRESSION: Small acute or early subacute infarct in the left posterior frontal white matter. Electronically Signed   By: Gilmore GORMAN Molt M.D.   On: 04/09/2024 21:49   CT ANGIO HEAD NECK W WO CM (CODE STROKE) Result Date: 04/09/2024 CLINICAL DATA:  Provided history: Neuro deficit, acute, stroke suspected. Additional history obtained from electronic MEDICAL RECORD NUMBERSlurred speech, facial asymmetry EXAM: CT ANGIOGRAPHY HEAD AND NECK WITH AND WITHOUT CONTRAST TECHNIQUE: Multidetector CT imaging of the head and neck was performed using the standard protocol during bolus administration of intravenous contrast. Multiplanar CT image reconstructions and MIPs were obtained to evaluate the vascular anatomy. Carotid stenosis measurements (when applicable) are obtained utilizing NASCET criteria, using the distal internal carotid diameter as the denominator. RADIATION DOSE REDUCTION: This exam was performed according to the departmental dose-optimization program which includes automated exposure control, adjustment of the mA and/or kV according to patient size and/or use of iterative reconstruction technique. CONTRAST:  75mL OMNIPAQUE   IOHEXOL  350 MG/ML SOLN COMPARISON:  Non-contrast head CT performed earlier today 04/09/2024. Chest CT 11/26/2019. FINDINGS: CTA NECK FINDINGS Aortic arch: Common origin of the innominate and left common carotid arteries. Atherosclerotic plaque within the visualized thoracic aorta and proximal major branch vessels of the neck. Right carotid system: CCA and ICA patent within the neck without hemodynamically significant stenosis (50% or greater). Atherosclerotic plaque scattered within the common carotid artery, about the carotid bifurcation and within the proximal ICA resulting in less than 50% stenosis. Focus of ulcerated plaque at the carotid bulb with defect measuring 4 mm (series 9, image 131). Left carotid system: CCA and ICA patent within the neck. Atherosclerotic plaque. Most notably, calcified and noncalcified plaque within the distal common  carotid artery results in a severe (greater than 80%) stenosis (for instance as seen on series 5, image 217). Vertebral arteries: Patent within the neck without stenosis. Nonstenotic calcified plaque within the left vertebral artery V3 segment. Skeleton: Poor dentition.  Cervical spondylosis. Other neck: 10 mm nodule within the left thyroid lobe not meeting consensus criteria for ultrasound follow-up based on size. No follow-up imaging recommended. Reference: J Am Coll Radiol. 2015 Feb;12(2): 143-50. Upper chest: Foci of nodular and bandlike scarring within the bilateral lungs at the imaged levels. Prior median sternotomy. Review of the MIP images confirms the above findings CTA HEAD FINDINGS Anterior circulation: The intracranial internal carotid arteries are patent. Atherosclerotic plaque with sites of moderate stenosis within the intracranial internal carotid arteries, bilaterally. The M1 middle cerebral arteries are patent. No M2 proximal branch occlusion or high-grade proximal stenosis. The anterior cerebral arteries are patent. Moderate-to-severe stenosis within the  left anterior cerebral artery at the A2/A3 junction. No intracranial aneurysm is identified. Posterior circulation: The intracranial vertebral arteries are patent. The non-dominant right vertebral artery terminates predominantly as the right PICA (with only a small contribution to the basilar artery). Nonstenotic atherosclerotic plaque within the proximal left V4 segment. The left anterior inferior cerebellar artery (AICA) appears to supply both the left AICA and PICA territories. The basilar artery is patent. The posterior cerebral arteries are patent. The left PCA is fetal in origin. The right posterior communicating artery is diminutive or absent. Venous sinuses: Within the limitations of contrast timing, no convincing thrombus. Anatomic variants: As described. Review of the MIP images confirms the above findings No proximal intracranial large vessel occlusion identified. These results were communicated to Dr. Voncile at 7:12 pmon 6/29/2025by text page via the [facility] messaging system. IMPRESSION: CTA neck: 1. The common carotid and internal carotid arteries are patent in the neck. Atherosclerotic plaque bilaterally as described. Most notably, calcified and noncalcified plaque results in a severe (estimated greater than 80%) stenosis of the distal left common carotid artery. Additionally, there is moderate atherosclerotic plaque within the distal right common carotid artery and about the right carotid bifurcation (with a focus of ulcerated plaque at this site). 2. Vertebral arteries patent within the neck without stenosis. 3. Aortic Atherosclerosis (ICD10-I70.0). 4. Foci of bandlike and nodular pulmonary scarring within the bilateral lungs at the imaged levels. Consider a dedicated chest CT for further evaluation. CTA head: 1. No proximal intracranial large vessel occlusion identified. 2. Intracranial atherosclerotic disease most notably as follows. 3. Sites of moderate stenosis within the bilateral intracranial  internal carotid arteries. 4. Moderate-to-severe stenosis within the left anterior cerebral artery at the A2/A3 junction. Electronically Signed   By: Rockey Childs D.O.   On: 04/09/2024 19:13   CT HEAD CODE STROKE WO CONTRAST Result Date: 04/09/2024 CLINICAL DATA:  Code stroke. Provided history: Neuro deficit, acute, stroke suspected. EXAM: CT HEAD WITHOUT CONTRAST TECHNIQUE: Contiguous axial images were obtained from the base of the skull through the vertex without intravenous contrast. RADIATION DOSE REDUCTION: This exam was performed according to the departmental dose-optimization program which includes automated exposure control, adjustment of the mA and/or kV according to patient size and/or use of iterative reconstruction technique. COMPARISON:  None. FINDINGS: Brain: Mild generalized cerebral atrophy. There is no acute intracranial hemorrhage. No demarcated cortical infarct. No extra-axial fluid collection. No evidence of an intracranial mass. No midline shift. Vascular: No hyperdense vessel.  Atherosclerotic calcifications. Skull: No calvarial fracture or aggressive osseous lesion. Sinuses/Orbits: No mass or acute finding within the  imaged orbits. Small-volume frothy secretions within a posterior left ethmoid air cell. ASPECTS [facility] Stroke Program Early CT Score) - Ganglionic level infarction (caudate, lentiform nuclei, internal capsule, insula, M1-M3 cortex): 7 - Supraganglionic infarction (M4-M6 cortex): 3 Total score (0-10 with 10 being normal): 10 No evidence of an acute intracranial abnormality. These results were communicated to Dr. Voncile at 6:35 pmon 6/29/2025by text page via the [facility] messaging system. IMPRESSION: 1.  No evidence of an acute intracranial abnormality. 2. Mild generalized cerebral atrophy. 3. Mild left ethmoid sinusitis. Electronically Signed   By: Rockey Childs D.O.   On: 04/09/2024 18:36    EKG: Independently reviewed.  Normal sinus rhythm.  Assessment/Plan Principal  Problem:   Acute CVA (cerebrovascular accident) (HCC) Active Problems:   Hypertension   S/P CABG x 4   ARF (acute renal failure) (HCC)    Acute CVA -      appreciate neurology consult.  Patient has been started on aspirin  Plavix .  Patient is intolerant to statins.  Patient has been placed on neurochecks.  Patient passed stroke swallow screen.  Check 2D echo.  Patient's CT angiogram head and neck shows multifocal stenosis in the neck and head most notably moderate stenosis in the right carotid with some plaque ulceration at the right carotid bifurcation as well as 80% stenosis of the left common carotid artery.  Further recommendations per neurology.  Physical therapy consult. Acute renal failure with creatinine increased from baseline.  Last labs available are in 2023.  Will hold patient's losartan hydrochlorothiazide and gently hydrate.  Follow metabolic panel. Hypertension holding antihypertensive with acute stroke allowing for permissive hypertension.  Holding beta-blockers ARB and hydrochlorothiazide.  Follow blood pressure trends. History of CAD status post CABG denies any chest pain.  On antiplatelet agents.  Intolerant to statins.  Holding beta-blockers allowing for permissive hypertension. Prior history of PE not on anticoagulation.  Check Dopplers of the lower extremity.  Since patient has acute CVA will need further workup and more than 2 midnight stay.   DVT prophylaxis: Lovenox. Code Status: Full code. Family Communication: Discussed with patient. Disposition Plan: Monitored bed. Consults called: Neurology. Admission status: Observation.

## 2024-04-09 NOTE — ED Notes (Signed)
 Patient transported to MRI

## 2024-04-09 NOTE — ED Notes (Signed)
 CT states no open table to call back in 5 minutes.

## 2024-04-10 ENCOUNTER — Observation Stay (HOSPITAL_BASED_OUTPATIENT_CLINIC_OR_DEPARTMENT_OTHER)

## 2024-04-10 ENCOUNTER — Other Ambulatory Visit (HOSPITAL_COMMUNITY): Payer: Self-pay

## 2024-04-10 DIAGNOSIS — I2510 Atherosclerotic heart disease of native coronary artery without angina pectoris: Secondary | ICD-10-CM | POA: Diagnosis not present

## 2024-04-10 DIAGNOSIS — R29700 NIHSS score 0: Secondary | ICD-10-CM | POA: Diagnosis not present

## 2024-04-10 DIAGNOSIS — I6381 Other cerebral infarction due to occlusion or stenosis of small artery: Secondary | ICD-10-CM | POA: Diagnosis not present

## 2024-04-10 DIAGNOSIS — Z86711 Personal history of pulmonary embolism: Secondary | ICD-10-CM | POA: Insufficient documentation

## 2024-04-10 DIAGNOSIS — Z951 Presence of aortocoronary bypass graft: Secondary | ICD-10-CM

## 2024-04-10 DIAGNOSIS — I6523 Occlusion and stenosis of bilateral carotid arteries: Secondary | ICD-10-CM

## 2024-04-10 DIAGNOSIS — Z87891 Personal history of nicotine dependence: Secondary | ICD-10-CM

## 2024-04-10 DIAGNOSIS — I10 Essential (primary) hypertension: Secondary | ICD-10-CM

## 2024-04-10 DIAGNOSIS — Z79899 Other long term (current) drug therapy: Secondary | ICD-10-CM

## 2024-04-10 DIAGNOSIS — E1165 Type 2 diabetes mellitus with hyperglycemia: Secondary | ICD-10-CM

## 2024-04-10 DIAGNOSIS — E785 Hyperlipidemia, unspecified: Secondary | ICD-10-CM

## 2024-04-10 DIAGNOSIS — I639 Cerebral infarction, unspecified: Secondary | ICD-10-CM | POA: Diagnosis not present

## 2024-04-10 LAB — COMPREHENSIVE METABOLIC PANEL WITH GFR
ALT: 16 U/L (ref 0–44)
AST: 20 U/L (ref 15–41)
Albumin: 3.3 g/dL — ABNORMAL LOW (ref 3.5–5.0)
Alkaline Phosphatase: 53 U/L (ref 38–126)
Anion gap: 12 (ref 5–15)
BUN: 23 mg/dL (ref 8–23)
CO2: 23 mmol/L (ref 22–32)
Calcium: 9 mg/dL (ref 8.9–10.3)
Chloride: 105 mmol/L (ref 98–111)
Creatinine, Ser: 1.03 mg/dL — ABNORMAL HIGH (ref 0.44–1.00)
GFR, Estimated: 58 mL/min — ABNORMAL LOW (ref >60)
Glucose, Bld: 156 mg/dL — ABNORMAL HIGH (ref 70–99)
Potassium: 3.8 mmol/L (ref 3.5–5.1)
Sodium: 140 mmol/L (ref 135–145)
Total Bilirubin: 0.6 mg/dL (ref 0.0–1.2)
Total Protein: 6.7 g/dL (ref 6.5–8.1)

## 2024-04-10 LAB — POCT I-STAT, CHEM 8
BUN: 25 mg/dL — ABNORMAL HIGH (ref 8–23)
Calcium, Ion: 1.2 mmol/L (ref 1.15–1.40)
Chloride: 106 mmol/L (ref 98–111)
Creatinine, Ser: 1.3 mg/dL — ABNORMAL HIGH (ref 0.44–1.00)
Glucose, Bld: 144 mg/dL — ABNORMAL HIGH (ref 70–99)
HCT: 37 % (ref 36.0–46.0)
Hemoglobin: 12.6 g/dL (ref 12.0–15.0)
Potassium: 3.8 mmol/L (ref 3.5–5.1)
Sodium: 142 mmol/L (ref 135–145)
TCO2: 22 mmol/L (ref 22–32)

## 2024-04-10 LAB — CBC
HCT: 34.6 % — ABNORMAL LOW (ref 36.0–46.0)
Hemoglobin: 11.4 g/dL — ABNORMAL LOW (ref 12.0–15.0)
MCH: 31.1 pg (ref 26.0–34.0)
MCHC: 32.9 g/dL (ref 30.0–36.0)
MCV: 94.5 fL (ref 80.0–100.0)
Platelets: 219 10*3/uL (ref 150–400)
RBC: 3.66 MIL/uL — ABNORMAL LOW (ref 3.87–5.11)
RDW: 12.9 % (ref 11.5–15.5)
WBC: 6.2 10*3/uL (ref 4.0–10.5)
nRBC: 0 % (ref 0.0–0.2)

## 2024-04-10 LAB — ECHOCARDIOGRAM COMPLETE
Area-P 1/2: 5.66 cm2
Calc EF: 55.9 %
Height: 68 in
S' Lateral: 2.9 cm
Single Plane A2C EF: 63.6 %
Single Plane A4C EF: 45.4 %
Weight: 3195.79 [oz_av]

## 2024-04-10 LAB — LIPID PANEL
Cholesterol: 234 mg/dL — ABNORMAL HIGH (ref 0–200)
HDL: 55 mg/dL (ref >40)
LDL Cholesterol: 157 mg/dL — ABNORMAL HIGH (ref 0–99)
Total CHOL/HDL Ratio: 4.3 ratio
Triglycerides: 110 mg/dL (ref <150)
VLDL: 22 mg/dL (ref 0–40)

## 2024-04-10 LAB — HEMOGLOBIN A1C
Hgb A1c MFr Bld: 7.8 % — ABNORMAL HIGH (ref 4.8–5.6)
Mean Plasma Glucose: 177.16 mg/dL

## 2024-04-10 MED ORDER — MIDODRINE HCL 5 MG PO TABS: 5.0000 mg | ORAL_TABLET | Freq: Once | ORAL | Status: DC

## 2024-04-10 MED ORDER — ORAL CARE MOUTH RINSE: 15.0000 mL | OROMUCOSAL | Status: DC | PRN

## 2024-04-10 MED ORDER — ASPIRIN 81 MG PO TBEC
81.0000 mg | DELAYED_RELEASE_TABLET | Freq: Every day | ORAL | 12 refills | Status: AC
  Filled 2024-04-10: qty 30, 30d supply, fill #0

## 2024-04-10 MED ORDER — CLOPIDOGREL BISULFATE 75 MG PO TABS
75.0000 mg | ORAL_TABLET | Freq: Every day | ORAL | 0 refills | Status: AC
Start: 2024-04-11 — End: ?
  Filled 2024-04-10: qty 21, 21d supply, fill #0

## 2024-04-10 NOTE — Progress Notes (Signed)
 VASCULAR LAB    Carotid duplex has been performed.  See CV proc for preliminary results.   Oday Ridings, RVT 04/10/2024, 1:49 PM

## 2024-04-10 NOTE — Discharge Summary (Signed)
 Physician Discharge Summary   Patient: Miranda [name] MRN: [id number] DOB: Sep 11, 1951  Admit date:     04/09/2024  Discharge date: 04/10/24  Discharge Physician: Lonni SHAUNNA Dalton   PCP: Leontine Cramp, NP     Recommendations at discharge:  Follow up with Vascular surgery Dr. Lanis for CEA scheduled on 7/8 Follow up with Neurology Dr. Rosemarie for cerebrovascular disease, TIA in 6-8 weeks Follow up with PCP Cramp Leontine in 1 week for new diagnosis diabetes, discussed with patient     Discharge Diagnoses: Principal Problem:   TIA and silent lacunar infarct Active Problems:   Peripheral vascular disease of the carotids   Hypertension   Coronary artery disease S/P CABG x 4   Obesity class 1   Acute renal failure ruled out   History of pulmonary embolism   Hyperlipidemia   Diabetes        Hospital Course: 73 y.o. F with CAD s/p CABG, HTN, HLD, carotid disease who presented with slurred speech, left facial droop.  Symptoms lasted 20 minutes then resolved.  In the ER, CT head normal, MRI brain showed left frontal lobe silent lacunar infarct.  Admitted for work up.     TIA MRI brain showed lacunar infarct in left frontal lobe. - Non-invasive angiography showed stenoses in bilateral carotids - Echocardiogram showed no cardiogenic source of embolism - Carotid ultrasound described below - Lipids ordered: LDL elevated, discharged with Leqvio referral - Aspirin  and Plavix  for 3 weeks then aspirin  alone indefinitely - Evaluation for arrhythmia/atrial fibrillation: none on monitoring - tPA not given because symptoms resolved   Carotid artery disease Previously known during work up for CABG but asymptomatic.  Now with right hemispheric symptoms (slurred speech, left facial droop, left facial numbness) and silent small vessel infarct on left.    US  carotid showed 80-99% stenosis on left, 60-79% on right.    Discussed with Neurology and vascular surgery.  Given radiographic  infarct on left and greater stenosis there, will revascularize that side first.    Scheduled for 7/8   Diabetes New diagnosis.  A1c 7.8%.  Recommend target <7%.  Referred to PCP for anti-glycemics.   Hyperlipidemia Previously statin intolerant to multiple statins.  Referred to pharmacy program for [facility], follow up with Neurology.            The [place]  Controlled Substances Registry was reviewed for this patient prior to discharge.  Consultants: Neurology Vascular surgery    Disposition: Home Diet recommendation:  Discharge Diet Orders (From admission, onward)     Start     Ordered   04/10/24 0000  Diet - low sodium heart healthy        04/10/24 1644             DISCHARGE MEDICATION: Allergies as of 04/10/2024   No Known Allergies      Medication List     TAKE these medications    acetaminophen  500 MG tablet Commonly known as: TYLENOL  Take 1,000 mg by mouth every 6 (six) hours as needed for moderate pain or headache.   albuterol  108 (90 Base) MCG/ACT inhaler Commonly known as: VENTOLIN  HFA Inhale 2 puffs into the lungs every 4 (four) hours as needed for wheezing or shortness of breath.   aspirin  EC 81 MG tablet Take 1 tablet (81 mg total) by mouth daily. Swallow whole. Start taking on: April 11, 2024   clopidogrel  75 MG tablet Commonly known as: PLAVIX  Take 1 tablet (75 mg total) by  mouth daily. Start taking on: April 11, 2024   colchicine  0.6 MG tablet Take 0.6 mg by mouth daily as needed (gout flares).   indomethacin  25 MG capsule Commonly known as: INDOCIN  Take 1 capsule (25 mg total) by mouth 3 (three) times daily as needed.   losartan-hydrochlorothiazide 50-12.5 MG tablet Commonly known as: HYZAAR Take 1 tablet by mouth daily.   metoprolol  succinate 25 MG 24 hr tablet Commonly known as: Toprol  XL Take 1 tablet (25 mg total) by mouth daily.        Follow-up Information     Lanis Fonda BRAVO, MD Follow up.   Specialty:  Vascular Surgery Contact information: [address] [place] [address] [phone]         Leontine Cramp, NP Follow up.   Specialty: Nurse Practitioner Contact information: [address] [place] [address] [phone]         [facility] Follow up.   Contact information: [address]     [address] [place]  [address] [phone]                Discharge Instructions     Ambulatory referral to Neurology   Complete by: As directed    An appointment is requested in approximately: 8 weeks TIA   Diet - low sodium heart healthy   Complete by: As directed    Discharge instructions   Complete by: As directed    **IMPORTANT DISCHARGE INSTRUCTIONS**   From Dr. Jonel: You were admitted for a TIA (transient ischemic attack, or ministroke)  You had a CT scan of the head, that showed no Internal bleeding in the brain.   You had a CT angiogram (picture of the blood vessels) that showed plaque build up in both carotid arteries of the neck  You had an MRI brain that showed a small silent lacunar infarct which means a stroke of the small vessels of the brain, but one which didn't cause symptoms.  These are usually caused by risk factors like high blood pressure, high cholesterol, smoking, alcohol or diabetes.  You had an echocardiogram (ultrasound of the heart) that was normal.  Your heart was monitored, and you had some brief bigeminy and occasional premature ventricular contractions, which are benign common findings in someone who has had a bypass surgery.  Lastly, you had the carotid ulrasound of the arteries of your neck, that showed high grade blockages  You were evaluatd by Vascular Surgery, Dr. Lanis, and they will follow up with you regarding surgery  For now: Take aspirin  81 mg daily and Plavix  75 mg Continue these medicines through surgery Eventually, you will reduce this to just aspirin  alone.   Ask Dr. Lanis after surgery if he has specific recommendations for this, or just the 21 days after the mini-stroke  We have referred you for the cholesterol medicine Leqvio.  If they don't contact you about this, call Dr. Bucky office  Go see Dr. Rosemarie in 6-8 weeks (they will call you)  Go see your primary doctor in 1 week  Take your normal home blood pressure medicines (you may take the metoprolol  this afternoon when you get home even, then resume both tomorrow morning per your usual schedule)   Increase activity slowly   Complete by: As directed        Discharge Exam: Filed Weights   04/09/24 1808 04/10/24 0301  Weight: 88.9 kg 90.6 kg    General: Pt is alert, awake, not in acute  distress Cardiovascular: RRR, nl S1-S2, no murmurs appreciated.   No LE edema.   Respiratory: Normal respiratory rate and rhythm.  CTAB without rales or wheezes. Abdominal: Abdomen soft and non-tender.  No distension or HSM.   Neuro/Psych: Strength symmetric in upper and lower extremities.  Judgment and insight appear normal.   Condition at discharge: good  The results of significant diagnostics from this hospitalization (including imaging, microbiology, ancillary and laboratory) are listed below for reference.   Imaging Studies: VAS US  CAROTID Result Date: 04/10/2024 Carotid Arterial Duplex Study Patient Name:  Miranda [name] [facility]  Date of Exam:   04/10/2024 Medical Rec #: [id number]        Accession #:    [id number] Date of Birth: [birth year]         Patient Gender: F Patient Age:   72 years Exam Location:  [facility] Procedure:      VAS US  CAROTID Referring Phys: LONNI DALTON --------------------------------------------------------------------------------  Indications:       CVA, Speech disturbance and Left-sided facial droop. Risk Factors:      Hypertension, hyperlipidemia, past history of smoking,                    coronary artery disease (history of CABG). Other Factors:     CTA of neck  04/09/2024 indicated no hemodynamically                    significant stenosis (<50%) on the right and >80% ICA                    stenosis on the left. Comparison Study:  Prior carotid duplex done 05/23/21 indicated 40-49% ICA                    stenosis and <50% ECA stenosis, bilaterally. Performing Technologist: Alberta Lis RVS  Examination Guidelines: A complete evaluation includes B-mode imaging, spectral Doppler, color Doppler, and power Doppler as needed of all accessible portions of each vessel. Bilateral testing is considered an integral part of a complete examination. Limited examinations for reoccurring indications may be performed as noted.  Right Carotid Findings: +----------+--------+--------+--------+-------------------------------+--------+           PSV cm/sEDV cm/sStenosisPlaque Description             Comments +----------+--------+--------+--------+-------------------------------+--------+ CCA Prox  85      16              calcific, irregular and diffuse         +----------+--------+--------+--------+-------------------------------+--------+ CCA Mid   120     17              calcific, irregular and diffuse         +----------+--------+--------+--------+-------------------------------+--------+ CCA Distal127     17              calcific, irregular and diffuse         +----------+--------+--------+--------+-------------------------------+--------+ ICA Prox  236     63      60-79%  irregular and heterogenous              +----------+--------+--------+--------+-------------------------------+--------+ ICA Mid   88      21                                                      +----------+--------+--------+--------+-------------------------------+--------+  ICA Distal122     24                                                      +----------+--------+--------+--------+-------------------------------+--------+ ECA       334     37      >50%     calcific                                +----------+--------+--------+--------+-------------------------------+--------+ +----------+--------+-------+--------------+-------------------+           PSV cm/sEDV cmsDescribe      Arm Pressure (mmHG) +----------+--------+-------+--------------+-------------------+ Subclavian               Not identified                    +----------+--------+-------+--------------+-------------------+ +---------+--------+--+--------+--+ VertebralPSV cm/s52EDV cm/s10 +---------+--------+--+--------+--+  Left Carotid Findings: +----------+--------+--------+--------+-------------------------------+--------+           PSV cm/sEDV cm/sStenosisPlaque Description             Comments +----------+--------+--------+--------+-------------------------------+--------+ CCA Prox  75      12              heterogenous                            +----------+--------+--------+--------+-------------------------------+--------+ CCA Distal112     18              homogeneous                             +----------+--------+--------+--------+-------------------------------+--------+ ICA Prox  468     125     80-99%  calcific, irregular and diffuse         +----------+--------+--------+--------+-------------------------------+--------+ ICA Mid   117     21                                                      +----------+--------+--------+--------+-------------------------------+--------+ ICA Distal117     24                                                      +----------+--------+--------+--------+-------------------------------+--------+ ECA       303     48      >50%    calcific                                +----------+--------+--------+--------+-------------------------------+--------+ +----------+--------+--------+--------------+-------------------+           PSV cm/sEDV cm/sDescribe      Arm Pressure (mmHG)  +----------+--------+--------+--------------+-------------------+ Subclavian                Not identified                    +----------+--------+--------+--------------+-------------------+ +---------+--------+--+--------+--+ VertebralPSV cm/s87EDV cm/s16 +---------+--------+--+--------+--+   Summary: Right Carotid: Velocities in the  right ICA are consistent with a 60-79%                stenosis. The ECA appears >50% stenosed. Left Carotid: Velocities in the left ICA are consistent with a 80-99% stenosis.               The ECA appears >50% stenosed. Vertebrals:  Bilateral vertebral arteries demonstrate antegrade flow. Subclavians: Not assessed. *See table(s) above for measurements and observations.  Electronically signed by Eather Popp MD on 04/10/2024 at 4:16:41 PM.    Final    ECHOCARDIOGRAM COMPLETE Result Date: 04/10/2024    ECHOCARDIOGRAM REPORT   Patient Name:   Miranda [name] Date of Exam: 04/10/2024 Medical Rec #:  [id number]       Height:       68.0 in Accession #:    [id number]      Weight:       199.7 lb Date of Birth:  [birth year]        BSA:          2.043 m Patient Age:    72 years        BP:           154/98 mmHg Patient Gender: F               HR:           43 bpm. Exam Location:  Inpatient Procedure: 2D Echo, Cardiac Doppler and Color Doppler (Both Spectral and Color            Flow Doppler were utilized during procedure). Indications:    Stroke  History:        Patient has no prior history of Echocardiogram examinations.                 Signs/Symptoms:Bacteremia; Risk Factors:Hypertension.  Sonographer:    Vella Key Referring Phys: FRANKY HALEY, N IMPRESSIONS  1. Left ventricular ejection fraction, by estimation, is 55 to 60%. The left ventricle has normal function. The left ventricle has no regional wall motion abnormalities. Left ventricular diastolic parameters were normal.  2. Right ventricular systolic function is normal. The right ventricular size is normal.  3. Left  atrial size was mildly dilated.  4. The mitral valve is normal in structure. Mild mitral valve regurgitation. No evidence of mitral stenosis.  5. The aortic valve is normal in structure. Aortic valve regurgitation is not visualized. No aortic stenosis is present.  6. The inferior vena cava is normal in size with greater than 50% respiratory variability, suggesting right atrial pressure of 3 mmHg.  7. Frequent ectopy during study. FINDINGS  Left Ventricle: Left ventricular ejection fraction, by estimation, is 55 to 60%. The left ventricle has normal function. The left ventricle has no regional wall motion abnormalities. The left ventricular internal cavity size was normal in size. There is  no left ventricular hypertrophy. Left ventricular diastolic parameters were normal. Right Ventricle: The right ventricular size is normal. No increase in right ventricular wall thickness. Right ventricular systolic function is normal. Left Atrium: Left atrial size was mildly dilated. Right Atrium: Right atrial size was normal in size. Pericardium: There is no evidence of pericardial effusion. Mitral Valve: The mitral valve is normal in structure. There is mild thickening of the mitral valve leaflet(s). Mild mitral valve regurgitation. No evidence of mitral valve stenosis. Tricuspid Valve: The tricuspid valve is normal in structure. Tricuspid valve regurgitation is trivial. No evidence of tricuspid stenosis. Aortic Valve: The aortic  valve is normal in structure. Aortic valve regurgitation is not visualized. No aortic stenosis is present. Pulmonic Valve: The pulmonic valve was normal in structure. Pulmonic valve regurgitation is mild. No evidence of pulmonic stenosis. Aorta: The aortic root is normal in size and structure. Venous: The inferior vena cava is normal in size with greater than 50% respiratory variability, suggesting right atrial pressure of 3 mmHg. IAS/Shunts: No atrial level shunt detected by color flow Doppler.  LEFT  VENTRICLE PLAX 2D LVIDd:         4.10 cm      Diastology LVIDs:         2.90 cm      LV e' medial:    9.36 cm/s LV PW:         0.90 cm      LV E/e' medial:  13.7 LV IVS:        0.90 cm      LV e' lateral:   10.80 cm/s LVOT diam:     1.70 cm      LV E/e' lateral: 11.9 LV SV:         38 LV SV Index:   19 LVOT Area:     2.27 cm  LV Volumes (MOD) LV vol d, MOD A2C: 122.0 ml LV vol d, MOD A4C: 100.0 ml LV vol s, MOD A2C: 44.4 ml LV vol s, MOD A4C: 54.6 ml LV SV MOD A2C:     77.6 ml LV SV MOD A4C:     100.0 ml LV SV MOD BP:      62.8 ml RIGHT VENTRICLE RV Basal diam:  4.10 cm RV S prime:     11.50 cm/s TAPSE (M-mode): 1.3 cm LEFT ATRIUM             Index        RIGHT ATRIUM           Index LA diam:        4.30 cm 2.11 cm/m   RA Area:     16.40 cm LA Vol (A2C):   83.6 ml 40.93 ml/m  RA Volume:   46.00 ml  22.52 ml/m LA Vol (A4C):   76.1 ml 37.26 ml/m LA Biplane Vol: 82.2 ml 40.24 ml/m  AORTIC VALVE LVOT Vmax:   97.28 cm/s LVOT Vmean:  64.150 cm/s LVOT VTI:    0.168 m  AORTA Ao Root diam: 3.10 cm Ao Asc diam:  3.00 cm MITRAL VALVE MV Area (PHT): 5.66 cm     SHUNTS MV Decel Time: 134 msec     Systemic VTI:  0.17 m MV E velocity: 128.00 cm/s  Systemic Diam: 1.70 cm MV A velocity: 118.00 cm/s MV E/A ratio:  1.08 Aditya Sabharwal Electronically signed by Ria Commander Signature Date/Time: 04/10/2024/2:53:31 PM    Final    VAS US  LOWER EXTREMITY VENOUS (DVT) Result Date: 04/10/2024  Lower Venous DVT Study Patient Name:  Miranda [name] [facility]  Date of Exam:   04/10/2024 Medical Rec #: [id number]        Accession #:    [id number] Date of Birth: 1950-11-04         Patient Gender: F Patient Age:   41 years Exam Location:  [facility] Procedure:      VAS US  LOWER EXTREMITY VENOUS (DVT) Referring Phys: REDIA CLEAVER --------------------------------------------------------------------------------  Indications: Stroke.  Limitations: Poor ultrasound/tissue interface and musculoskeletal features. Comparison Study: Prior  negative bilateral LEV done 10/16/21 Performing Technologist: Alberta Lis RVS  Examination Guidelines: A complete evaluation includes B-mode imaging, spectral Doppler, color Doppler, and power Doppler as needed of all accessible portions of each vessel. Bilateral testing is considered an integral part of a complete examination. Limited examinations for reoccurring indications may be performed as noted. The reflux portion of the exam is performed with the patient in reverse Trendelenburg.  +---------+---------------+---------+-----------+----------+-------------------+ RIGHT    CompressibilityPhasicitySpontaneityPropertiesThrombus Aging      +---------+---------------+---------+-----------+----------+-------------------+ CFV      Full           Yes      No                                       +---------+---------------+---------+-----------+----------+-------------------+ SFJ      Full                                                             +---------+---------------+---------+-----------+----------+-------------------+ FV Prox  Full                                                             +---------+---------------+---------+-----------+----------+-------------------+ FV Mid   Full                                                             +---------+---------------+---------+-----------+----------+-------------------+ FV DistalFull           Yes      No                                       +---------+---------------+---------+-----------+----------+-------------------+ PFV      Full                                                             +---------+---------------+---------+-----------+----------+-------------------+ POP      Full           Yes      No                                       +---------+---------------+---------+-----------+----------+-------------------+ PTV      Full                                                              +---------+---------------+---------+-----------+----------+-------------------+ PERO  Not well visualized +---------+---------------+---------+-----------+----------+-------------------+   +---------+---------------+---------+-----------+----------+-------------------+ LEFT     CompressibilityPhasicitySpontaneityPropertiesThrombus Aging      +---------+---------------+---------+-----------+----------+-------------------+ CFV      Full           Yes      No                                       +---------+---------------+---------+-----------+----------+-------------------+ SFJ      Full                                                             +---------+---------------+---------+-----------+----------+-------------------+ FV Prox  Full                                                             +---------+---------------+---------+-----------+----------+-------------------+ FV Mid   Full                                                             +---------+---------------+---------+-----------+----------+-------------------+ FV DistalFull                                                             +---------+---------------+---------+-----------+----------+-------------------+ PFV      Full                                                             +---------+---------------+---------+-----------+----------+-------------------+ POP      Full           Yes      No                                       +---------+---------------+---------+-----------+----------+-------------------+ PTV      Full                                                             +---------+---------------+---------+-----------+----------+-------------------+ PERO                                                  Not well visualized +---------+---------------+---------+-----------+----------+-------------------+  Area  of mixed echoes noted in the left popliteal fossa creating acoustic shadowing. Etiology unknown   Summary: RIGHT: - There is no evidence of deep vein thrombosis in the lower extremity.  - Ultrasound characteristics of enlarged lymph nodes are noted in the groin.  LEFT: - There is no evidence of deep vein thrombosis in the lower extremity.  - Area of mixed echoes noted in the left popliteal fossa creating acoustic shadowing. Etiology unknown - Ultrasound characteristics of enlarged lymph nodes noted in the groin.  *See table(s) above for measurements and observations.    Preliminary    MR BRAIN WO CONTRAST Result Date: 04/09/2024 CLINICAL DATA:  Neuro deficit, acute, stroke suspected EXAM: MRI HEAD WITHOUT CONTRAST TECHNIQUE: Multiplanar, multiecho pulse sequences of the brain and surrounding structures were obtained without intravenous contrast. COMPARISON:  CT head from earlier today. FINDINGS: Brain: Small focus of mild restricted diffusion in the left posterior frontal white matter (for example see series 9, image 84 and series 10, image 33), compatible with acute or early subacute infarct. No significant edema or mass effect. Mild scattered T2/FLAIR hyperintensities in the white matter compatible with chronic microvascular ischemic change. No acute hemorrhage, hydrocephalus, extra-axial collection or mass lesion. Vascular: Normal flow voids. Skull and upper cervical spine: Normal marrow signal. Sinuses/Orbits: Clear sinuses.  No acute orbital findings. Other: No mastoid effusions. IMPRESSION: Small acute or early subacute infarct in the left posterior frontal white matter. Electronically Signed   By: Gilmore GORMAN Molt M.D.   On: 04/09/2024 21:49   CT ANGIO HEAD NECK W WO CM (CODE STROKE) Result Date: 04/09/2024 CLINICAL DATA:  Provided history: Neuro deficit, acute, stroke suspected. Additional history obtained from electronic MEDICAL RECORD NUMBERSlurred speech, facial asymmetry EXAM: CT ANGIOGRAPHY HEAD  AND NECK WITH AND WITHOUT CONTRAST TECHNIQUE: Multidetector CT imaging of the head and neck was performed using the standard protocol during bolus administration of intravenous contrast. Multiplanar CT image reconstructions and MIPs were obtained to evaluate the vascular anatomy. Carotid stenosis measurements (when applicable) are obtained utilizing NASCET criteria, using the distal internal carotid diameter as the denominator. RADIATION DOSE REDUCTION: This exam was performed according to the departmental dose-optimization program which includes automated exposure control, adjustment of the mA and/or kV according to patient size and/or use of iterative reconstruction technique. CONTRAST:  75mL OMNIPAQUE  IOHEXOL  350 MG/ML SOLN COMPARISON:  Non-contrast head CT performed earlier today 04/09/2024. Chest CT 11/26/2019. FINDINGS: CTA NECK FINDINGS Aortic arch: Common origin of the innominate and left common carotid arteries. Atherosclerotic plaque within the visualized thoracic aorta and proximal major branch vessels of the neck. Right carotid system: CCA and ICA patent within the neck without hemodynamically significant stenosis (50% or greater). Atherosclerotic plaque scattered within the common carotid artery, about the carotid bifurcation and within the proximal ICA resulting in less than 50% stenosis. Focus of ulcerated plaque at the carotid bulb with defect measuring 4 mm (series 9, image 131). Left carotid system: CCA and ICA patent within the neck. Atherosclerotic plaque. Most notably, calcified and noncalcified plaque within the distal common carotid artery results in a severe (greater than 80%) stenosis (for instance as seen on series 5, image 217). Vertebral arteries: Patent within the neck without stenosis. Nonstenotic calcified plaque within the left vertebral artery V3 segment. Skeleton: Poor dentition.  Cervical spondylosis. Other neck: 10 mm nodule within the left thyroid lobe not meeting consensus  criteria for ultrasound follow-up based on size. No follow-up imaging recommended. Reference: J Am Coll Radiol. 2015 Feb;12(2): 143-50. Upper  chest: Foci of nodular and bandlike scarring within the bilateral lungs at the imaged levels. Prior median sternotomy. Review of the MIP images confirms the above findings CTA HEAD FINDINGS Anterior circulation: The intracranial internal carotid arteries are patent. Atherosclerotic plaque with sites of moderate stenosis within the intracranial internal carotid arteries, bilaterally. The M1 middle cerebral arteries are patent. No M2 proximal branch occlusion or high-grade proximal stenosis. The anterior cerebral arteries are patent. Moderate-to-severe stenosis within the left anterior cerebral artery at the A2/A3 junction. No intracranial aneurysm is identified. Posterior circulation: The intracranial vertebral arteries are patent. The non-dominant right vertebral artery terminates predominantly as the right PICA (with only a small contribution to the basilar artery). Nonstenotic atherosclerotic plaque within the proximal left V4 segment. The left anterior inferior cerebellar artery (AICA) appears to supply both the left AICA and PICA territories. The basilar artery is patent. The posterior cerebral arteries are patent. The left PCA is fetal in origin. The right posterior communicating artery is diminutive or absent. Venous sinuses: Within the limitations of contrast timing, no convincing thrombus. Anatomic variants: As described. Review of the MIP images confirms the above findings No proximal intracranial large vessel occlusion identified. These results were communicated to Dr. Voncile at 7:12 pmon 6/29/2025by text page via the [facility] messaging system. IMPRESSION: CTA neck: 1. The common carotid and internal carotid arteries are patent in the neck. Atherosclerotic plaque bilaterally as described. 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 Additionally, there is moderate atherosclerotic plaque within the distal right common carotid artery and about the right carotid bifurcation (with a focus of ulcerated plaque at this site). 2. Vertebral arteries patent within the neck without stenosis. 3. Aortic Atherosclerosis (ICD10-I70.0). 4. Foci of bandlike and nodular pulmonary scarring within the bilateral lungs at the imaged levels. Consider a dedicated chest CT for further evaluation. CTA head: 1. No proximal intracranial large vessel occlusion identified. 2. Intracranial atherosclerotic disease most notably as follows. 3. Sites of moderate stenosis within the bilateral intracranial internal carotid arteries. 4. Moderate-to-severe stenosis within the left anterior cerebral artery at the A2/A3 junction. Electronically Signed   By: Rockey Childs D.O.   On: 04/09/2024 19:13   CT HEAD CODE STROKE WO CONTRAST Result Date: 04/09/2024 CLINICAL DATA:  Code stroke. Provided history: Neuro deficit, acute, stroke suspected. EXAM: CT HEAD WITHOUT CONTRAST TECHNIQUE: Contiguous axial images were obtained from the base of the skull through the vertex without intravenous contrast. RADIATION DOSE REDUCTION: This exam was performed according to the departmental dose-optimization program which includes automated exposure control, adjustment of the mA and/or kV according to patient size and/or use of iterative reconstruction technique. COMPARISON:  None. FINDINGS: Brain: Mild generalized cerebral atrophy. There is no acute intracranial hemorrhage. No demarcated cortical infarct. No extra-axial fluid collection. No evidence of an intracranial mass. No midline shift. Vascular: No hyperdense vessel.  Atherosclerotic calcifications. Skull: No calvarial fracture or aggressive osseous lesion. Sinuses/Orbits: No mass or acute finding within the imaged orbits. Small-volume frothy secretions within a posterior left  ethmoid air cell. ASPECTS [facility] Stroke Program Early CT Score) - Ganglionic level infarction (caudate, lentiform nuclei, internal capsule, insula, M1-M3 cortex): 7 - Supraganglionic infarction (M4-M6 cortex): 3 Total score (0-10 with 10 being normal): 10 No evidence of an acute intracranial abnormality. These results were communicated to Dr. Voncile at 6:35 pmon 6/29/2025by text page via the [facility] messaging system. IMPRESSION: 1.  No evidence  of an acute intracranial abnormality. 2. Mild generalized cerebral atrophy. 3. Mild left ethmoid sinusitis. Electronically Signed   By: Rockey Childs D.O.   On: 04/09/2024 18:36    Microbiology: Results for orders placed or performed during the hospital encounter of 11/25/19  SARS CORONAVIRUS 2 (TAT 6-24 HRS) Nasopharyngeal Nasopharyngeal Swab     Status: None   Collection Time: 11/26/19  1:33 AM   Specimen: Nasopharyngeal Swab  Result Value Ref Range Status   SARS Coronavirus 2 NEGATIVE NEGATIVE Final    Comment: (NOTE) SARS-CoV-2 target nucleic acids are NOT DETECTED. The SARS-CoV-2 RNA is generally detectable in upper and lower respiratory specimens during the acute phase of infection. Negative results do not preclude SARS-CoV-2 infection, do not rule out co-infections with other pathogens, and should not be used as the sole basis for treatment or other patient management decisions. Negative results must be combined with clinical observations, patient history, and epidemiological information. The expected result is Negative. Fact Sheet for Patients: HairSlick.no Fact Sheet for Healthcare Providers: quierodirigir.com This test is not yet approved or cleared by the [place]  FDA and  has been authorized for detection and/or diagnosis of SARS-CoV-2 by FDA under an Emergency Use Authorization (EUA). This EUA will remain  in effect (meaning this test can be used) for the duration of the COVID-19  declaration under Section 56 4(b)(1) of the Act, 21 U.S.C. section 360bbb-3(b)(1), unless the authorization is terminated or revoked sooner. Performed at [facility], [address], [place], [place] [address]   Respiratory Panel by RT PCR (Flu A&B, Covid) - Nasopharyngeal Swab     Status: None   Collection Time: 11/26/19  4:21 AM   Specimen: Nasopharyngeal Swab  Result Value Ref Range Status   SARS Coronavirus 2 by RT PCR NEGATIVE NEGATIVE Final    Comment: (NOTE) SARS-CoV-2 target nucleic acids are NOT DETECTED. The SARS-CoV-2 RNA is generally detectable in upper respiratoy specimens during the acute phase of infection. The lowest concentration of SARS-CoV-2 viral copies this assay can detect is 131 copies/mL. A negative result does not preclude SARS-Cov-2 infection and should not be used as the sole basis for treatment or other patient management decisions. A negative result may occur with  improper specimen collection/handling, submission of specimen other than nasopharyngeal swab, presence of viral mutation(s) within the areas targeted by this assay, and inadequate number of viral copies (<131 copies/mL). A negative result must be combined with clinical observations, patient history, and epidemiological information. The expected result is Negative. Fact Sheet for Patients:  https://www.moore.com/ Fact Sheet for Healthcare Providers:  https://www.young.biz/ This test is not yet ap proved or cleared by the [place]  FDA and  has been authorized for detection and/or diagnosis of SARS-CoV-2 by FDA under an Emergency Use Authorization (EUA). This EUA will remain  in effect (meaning this test can be used) for the duration of the COVID-19 declaration under Section 564(b)(1) of the Act, 21 U.S.C. section 360bbb-3(b)(1), unless the authorization is terminated or revoked sooner.    Influenza A by PCR NEGATIVE NEGATIVE Final   Influenza  B by PCR NEGATIVE NEGATIVE Final    Comment: (NOTE) The Xpert Xpress SARS-CoV-2/FLU/RSV assay is intended as an aid in  the diagnosis of influenza from Nasopharyngeal swab specimens and  should not be used as a sole basis for treatment. Nasal washings and  aspirates are unacceptable for Xpert Xpress SARS-CoV-2/FLU/RSV  testing. Fact Sheet for Patients: https://www.moore.com/ Fact Sheet for Healthcare Providers: https://www.young.biz/ This test  is not yet approved or cleared by the [place]  FDA and  has been authorized for detection and/or diagnosis of SARS-CoV-2 by  FDA under an Emergency Use Authorization (EUA). This EUA will remain  in effect (meaning this test can be used) for the duration of the  Covid-19 declaration under Section 564(b)(1) of the Act, 21  U.S.C. section 360bbb-3(b)(1), unless the authorization is  terminated or revoked. Performed at [facility], [address] [place], [place] [address]   Surgical pcr screen     Status: None   Collection Time: 11/28/19  7:54 PM   Specimen: Nasal Mucosa; Nasal Swab  Result Value Ref Range Status   MRSA, PCR NEGATIVE NEGATIVE Final   Staphylococcus aureus NEGATIVE NEGATIVE Final    Comment: (NOTE) The Xpert SA Assay (FDA approved for NASAL specimens in patients 31 years of age and older), is one component of a comprehensive surveillance program. It is not intended to diagnose infection nor to guide or monitor treatment. Performed at [facility], [address] [place], [place] [address]     Labs: CBC: Recent Labs  Lab 04/09/24 1801 04/09/24 1824 04/10/24 0228  WBC 7.4  --  6.2  NEUTROABS 4.2  --   --   HGB 12.5 12.6 11.4*  HCT 37.2 37.0 34.6*  MCV 93.0  --  94.5  PLT 239  --  219   Basic Metabolic Panel: Recent Labs  Lab 04/09/24 1801 04/09/24 1824 04/10/24 0228  NA 140 142 140  K 3.7 3.8 3.8  CL 106 106 105  CO2 23  --  23  GLUCOSE 142* 144*  156*  BUN 23 25* 23  CREATININE 1.25* 1.30* 1.03*  CALCIUM  9.3  --  9.0   Liver Function Tests: Recent Labs  Lab 04/09/24 1801 04/10/24 0228  AST 22 20  ALT 17 16  ALKPHOS 59 53  BILITOT 0.4 0.6  PROT 7.4 6.7  ALBUMIN  3.7 3.3*   CBG: No results for input(s): GLUCAP in the last 168 hours.  Discharge time spent: approximately 45 minutes spent on discharge counseling, evaluation of patient on day of discharge, and coordination of discharge planning with nursing, social work, pharmacy and case management  Signed: Lonni SHAUNNA Dalton, MD [facility] 04/10/2024

## 2024-04-10 NOTE — Progress Notes (Addendum)
 STROKE TEAM PROGRESS NOTE    INTERIM HISTORY/SUBJECTIVE Patient has remained hemodynamically stable and afebrile overnight.  She reports that her symptoms have completely resolved. Patient presented with transient slurred speech and left facial paresthesias which have resolved.  MRI shows tiny subacute left subcortical infarct which is unrelated to her symptoms.  CT angiogram had suggested 80% calcified proximal left ICA stenosis.  Carotid ultrasound confirms 60-79% right ICA and 80-99% left ICA stenosis. OBJECTIVE  CBC    Component Value Date/Time   WBC 6.2 04/10/2024 0228   RBC 3.66 (L) 04/10/2024 0228   HGB 11.4 (L) 04/10/2024 0228   HCT 34.6 (L) 04/10/2024 0228   PLT 219 04/10/2024 0228   MCV 94.5 04/10/2024 0228   MCV 88.3 09/17/2016 1209   MCH 31.1 04/10/2024 0228   MCHC 32.9 04/10/2024 0228   RDW 12.9 04/10/2024 0228   LYMPHSABS 2.2 04/09/2024 1801   MONOABS 0.9 04/09/2024 1801   EOSABS 0.1 04/09/2024 1801   BASOSABS 0.0 04/09/2024 1801    BMET    Component Value Date/Time   NA 140 04/10/2024 0228   NA 142 02/06/2020 0807   K 3.8 04/10/2024 0228   CL 105 04/10/2024 0228   CO2 23 04/10/2024 0228   GLUCOSE 156 (H) 04/10/2024 0228   BUN 23 04/10/2024 0228   BUN 16 02/06/2020 0807   CREATININE 1.03 (H) 04/10/2024 0228   CREATININE 0.90 07/17/2016 1334   CALCIUM  9.0 04/10/2024 0228   GFRNONAA 58 (L) 04/10/2024 0228   GFRNONAA 66 03/12/2016 1040    IMAGING past 24 hours MR BRAIN WO CONTRAST Result Date: 04/09/2024 CLINICAL DATA:  Neuro deficit, acute, stroke suspected EXAM: MRI HEAD WITHOUT CONTRAST TECHNIQUE: Multiplanar, multiecho pulse sequences of the brain and surrounding structures were obtained without intravenous contrast. COMPARISON:  CT head from earlier today. FINDINGS: Brain: Small focus of mild restricted diffusion in the left posterior frontal white matter (for example see series 9, image 84 and series 10, image 33), compatible with acute or early  subacute infarct. No significant edema or mass effect. Mild scattered T2/FLAIR hyperintensities in the white matter compatible with chronic microvascular ischemic change. No acute hemorrhage, hydrocephalus, extra-axial collection or mass lesion. Vascular: Normal flow voids. Skull and upper cervical spine: Normal marrow signal. Sinuses/Orbits: Clear sinuses.  No acute orbital findings. Other: No mastoid effusions. IMPRESSION: Small acute or early subacute infarct in the left posterior frontal white matter. Electronically Signed   By: Gilmore GORMAN Molt M.D.   On: 04/09/2024 21:49   CT ANGIO HEAD NECK W WO CM (CODE STROKE) Result Date: 04/09/2024 CLINICAL DATA:  Provided history: Neuro deficit, acute, stroke suspected. Additional history obtained from electronic MEDICAL RECORD NUMBERSlurred speech, facial asymmetry EXAM: CT ANGIOGRAPHY HEAD AND NECK WITH AND WITHOUT CONTRAST TECHNIQUE: Multidetector CT imaging of the head and neck was performed using the standard protocol during bolus administration of intravenous contrast. Multiplanar CT image reconstructions and MIPs were obtained to evaluate the vascular anatomy. Carotid stenosis measurements (when applicable) are obtained utilizing NASCET criteria, using the distal internal carotid diameter as the denominator. RADIATION DOSE REDUCTION: This exam was performed according to the departmental dose-optimization program which includes automated exposure control, adjustment of the mA and/or kV according to patient size and/or use of iterative reconstruction technique. CONTRAST:  75mL OMNIPAQUE  IOHEXOL  350 MG/ML SOLN COMPARISON:  Non-contrast head CT performed earlier today 04/09/2024. Chest CT 11/26/2019. FINDINGS: CTA NECK FINDINGS Aortic arch: Common origin of the innominate and left common carotid arteries. Atherosclerotic plaque within  the visualized thoracic aorta and proximal major branch vessels of the neck. Right carotid system: CCA and ICA patent within the neck  without hemodynamically significant stenosis (50% or greater). Atherosclerotic plaque scattered within the common carotid artery, about the carotid bifurcation and within the proximal ICA resulting in less than 50% stenosis. Focus of ulcerated plaque at the carotid bulb with defect measuring 4 mm (series 9, image 131). Left carotid system: CCA and ICA patent within the neck. Atherosclerotic plaque. Most notably, calcified and noncalcified plaque within the distal common carotid artery results in a severe (greater than 80%) stenosis (for instance as seen on series 5, image 217). Vertebral arteries: Patent within the neck without stenosis. Nonstenotic calcified plaque within the left vertebral artery V3 segment. Skeleton: Poor dentition.  Cervical spondylosis. Other neck: 10 mm nodule within the left thyroid lobe not meeting consensus criteria for ultrasound follow-up based on size. No follow-up imaging recommended. Reference: J Am Coll Radiol. 2015 Feb;12(2): 143-50. Upper chest: Foci of nodular and bandlike scarring within the bilateral lungs at the imaged levels. Prior median sternotomy. Review of the MIP images confirms the above findings CTA HEAD FINDINGS Anterior circulation: The intracranial internal carotid arteries are patent. Atherosclerotic plaque with sites of moderate stenosis within the intracranial internal carotid arteries, bilaterally. The M1 middle cerebral arteries are patent. No M2 proximal branch occlusion or high-grade proximal stenosis. The anterior cerebral arteries are patent. Moderate-to-severe stenosis within the left anterior cerebral artery at the A2/A3 junction. No intracranial aneurysm is identified. Posterior circulation: The intracranial vertebral arteries are patent. The non-dominant right vertebral artery terminates predominantly as the right PICA (with only a small contribution to the basilar artery). Nonstenotic atherosclerotic plaque within the proximal left V4 segment. The left  anterior inferior cerebellar artery (AICA) appears to supply both the left AICA and PICA territories. The basilar artery is patent. The posterior cerebral arteries are patent. The left PCA is fetal in origin. The right posterior communicating artery is diminutive or absent. Venous sinuses: Within the limitations of contrast timing, no convincing thrombus. Anatomic variants: As described. Review of the MIP images confirms the above findings No proximal intracranial large vessel occlusion identified. These results were communicated to Dr. Voncile at 7:12 pmon 6/29/2025by text page via the [facility] messaging system. IMPRESSION: CTA neck: 1. The common carotid and internal carotid arteries are patent in the neck. Atherosclerotic plaque bilaterally as described. Most notably, calcified and noncalcified plaque results in a severe (estimated greater than 80%) stenosis of the distal left common carotid artery. Additionally, there is moderate atherosclerotic plaque within the distal right common carotid artery and about the right carotid bifurcation (with a focus of ulcerated plaque at this site). 2. Vertebral arteries patent within the neck without stenosis. 3. Aortic Atherosclerosis (ICD10-I70.0). 4. Foci of bandlike and nodular pulmonary scarring within the bilateral lungs at the imaged levels. Consider a dedicated chest CT for further evaluation. CTA head: 1. No proximal intracranial large vessel occlusion identified. 2. Intracranial atherosclerotic disease most notably as follows. 3. Sites of moderate stenosis within the bilateral intracranial internal carotid arteries. 4. Moderate-to-severe stenosis within the left anterior cerebral artery at the A2/A3 junction. Electronically Signed   By: Rockey Childs D.O.   On: 04/09/2024 19:13   CT HEAD CODE STROKE WO CONTRAST Result Date: 04/09/2024 CLINICAL DATA:  Code stroke. Provided history: Neuro deficit, acute, stroke suspected. EXAM: CT HEAD WITHOUT CONTRAST TECHNIQUE:  Contiguous axial images were obtained from the base of the skull through the vertex  without intravenous contrast. RADIATION DOSE REDUCTION: This exam was performed according to the departmental dose-optimization program which includes automated exposure control, adjustment of the mA and/or kV according to patient size and/or use of iterative reconstruction technique. COMPARISON:  None. FINDINGS: Brain: Mild generalized cerebral atrophy. There is no acute intracranial hemorrhage. No demarcated cortical infarct. No extra-axial fluid collection. No evidence of an intracranial mass. No midline shift. Vascular: No hyperdense vessel.  Atherosclerotic calcifications. Skull: No calvarial fracture or aggressive osseous lesion. Sinuses/Orbits: No mass or acute finding within the imaged orbits. Small-volume frothy secretions within a posterior left ethmoid air cell. ASPECTS [facility] Stroke Program Early CT Score) - Ganglionic level infarction (caudate, lentiform nuclei, internal capsule, insula, M1-M3 cortex): 7 - Supraganglionic infarction (M4-M6 cortex): 3 Total score (0-10 with 10 being normal): 10 No evidence of an acute intracranial abnormality. These results were communicated to Dr. Voncile at 6:35 pmon 6/29/2025by text page via the [facility] messaging system. IMPRESSION: 1.  No evidence of an acute intracranial abnormality. 2. Mild generalized cerebral atrophy. 3. Mild left ethmoid sinusitis. Electronically Signed   By: Rockey Childs D.O.   On: 04/09/2024 18:36    Vitals:   04/10/24 0223 04/10/24 0301 04/10/24 0744 04/10/24 1112  BP:  (!) 154/98 134/77 (!) 182/71  Pulse:  65 62 72  Resp:  18 14 18   Temp: 98.3 F (36.8 C) 98.1 F (36.7 C) 98 F (36.7 C) 98.1 F (36.7 C)  TempSrc: Oral Oral Oral Oral  SpO2:  100% 98% 100%  Weight:  90.6 kg    Height:  5' 8 (1.727 m)       PHYSICAL EXAM General:  Alert, well-nourished, well-developed patient in no acute distress Psych:  Mood and affect appropriate for  situation CV: Regular rate and rhythm on monitor Respiratory:  Regular, unlabored respirations on room air   NEURO:  Mental Status: AA&Ox3, patient is able to give clear and coherent history Speech/Language: speech is without dysarthria or aphasia.    Cranial Nerves:  II: PERRL. Visual fields full.  III, IV, VI: EOMI. Eyelids elevate symmetrically.  V: Sensation is intact to light touch and symmetrical to face.  VII: Face is symmetrical resting and smiling VIII: hearing intact to voice. IX, X: Phonation is normal.  XII: tongue is midline without fasciculations. Motor: Able to move all 4 extremities with good antigravity strength Tone: is normal and bulk is normal Sensation- Intact to light touch bilaterally.  Coordination: FTN intact bilaterally Gait- deferred  Most Recent NIH 0  ASSESSMENT/PLAN  Ms. Miranda [name] is a 73 y.o. female with history of CAD status post CABG, hypertension, hyperlipidemia and statin intolerance admitted for acute onset slurred speech, left facial numbness and left facial droop.  MRI demonstrates acute to early subacute infarct in left posterior frontal white matter.  CT angiogram revealed atherosclerosis in left common carotid artery, would recommend outpatient follow-up with vascular surgeon, no need to address this urgently.  NIH on Admission 1  Acute ischemic infarct: Small acute or early subacute infarct in left posterior frontal white matter Etiology: Small vessel disease but patient has significant left greater than right extracranial carotid stenosis which is also symptomatic Code Stroke CT head No acute abnormality.  Atrophy. ASPECTS 10.    CTA head & neck no LVO, severe stenosis of distal left common carotid artery MRI small acute or early subacute infarct in left posterior frontal white matter Carotid Doppler 60-79% right ICA and 80-99% left ICA stenosis Bilateral lower extremity  ultrasound pending 2D Echo pending LDL 157 HgbA1c 7.8 VTE  prophylaxis -Lovenox No antithrombotic prior to admission, now on aspirin  81 mg daily and clopidogrel  75 mg daily for 4 weeks and then aspirin  alone or per cardiology. Therapy recommendations:  No follow up needed  Disposition: Pending, likely home  Hypertension Home meds: Metoprolol  XL 25 mg daily, losartan-hydrochlorothiazide 50-12.5 mg daily Stable Blood Pressure Goal: BP less than 220/110   Hyperlipidemia Home meds: None LDL 157, goal < 70 Will initiate process for obtaining Leqvio High intensity statin not indicated due to previous intolerance to multiple statins and ezetimibe Continue statin at discharge  Diabetes type II poorly controlled Home meds: None HgbA1c 7.8, goal < 7.0 CBGs SSI Recommend close follow-up with PCP for better DM control  Other Stroke Risk Factors ETOH use, alcohol level <15, advised to drink no more than 1 drink a day Obesity, Body mass index is 30.37 kg/m., BMI >/= 30 associated with increased stroke risk, recommend weight loss, diet and exercise as appropriate  Coronary artery disease   Other Active Problems None  Hospital day # 0  Patient seen by NP with MD, MD to edit note as needed. Cortney E Everitt Clint Kill , MSN, AGACNP-BC [facility] See Amion for schedule and pager information 04/10/2024 1:46 PM  I have personally obtained history,examined this patient, reviewed notes, independently viewed imaging studies, participated in medical decision making and plan of care.ROS completed by me personally and pertinent positives fully documented  I have made any additions or clarifications directly to the above note. Agree with note above.  Patient presented with transient left face weakness and slurred speech due to moderate proximal right ICA stenosis but also has severe left extracranial carotid stenosis with silent left subcortical infarct.  She will need elective bilateral carotid revascularization initially left side and followed by a  left right side.  Later.  Aspirin  and Plavix  for 3 weeks followed by aspirin  alone and aggressive risk factor modification.  Long discussion with patient and answered questions.  Discussed with Dr. Jonel. Greater than 50% time during this 50-minute visit was spent in counseling and coordination of care about a TIA and silent lacunar stroke as well as bilateral symptomatic carotid stenosis answering questions Eather Popp, MD Medical Director [facility] Pager: [phone] 04/10/2024 2:37 PM   To contact Stroke Continuity provider, please refer to [email or web address]. After hours, contact General Neurology

## 2024-04-10 NOTE — Evaluation (Signed)
 Occupational Therapy Evaluation and DC Summary   Patient Details Name: Miranda [name] MRN: [id number] DOB: September 16, 1951 Today's Date: 04/10/2024   History of Present Illness   Pt is a 73 yo female [facility] ED on [facility]/09/24 with c/o slurred speech and L sided facial droop. MRI shows subacute L frontal infarct, CTA shows multifocal stenosis of R ICA with 80% stenosis of L common carotid. PMH of Aortic atherosclerosis, CAD, COPD, DM, gout, hx PE, HLD, HTN, osteopenia.     Clinical Impressions Pt admitted for above, PTA pt reports being fully ind in mobility and ADLs. Pt without any noted deficits in vision, BUE coordination or strength. Administered medicog to which she scored satisfactory on the assessment 8/10, needed a bit more time with question 2 on the medication transfer log. Pt overall at her baseline, completing ADLs and mobility independently, she verbalized understanding CVA education from the PT session. Asked RN about procuring CVA booklet for pt. Pt has no further acute skilled OT needs, no post acute OT recommended.      If plan is discharge home, recommend the following:   Other (comment) (prn)     Functional Status Assessment   Patient has not had a recent decline in their functional status     Equipment Recommendations   None recommended by OT     Recommendations for Other Services    (n/a)     Precautions/Restrictions   Precautions Precautions: None Restrictions Weight Bearing Restrictions Per Provider Order: No     Mobility Bed Mobility               General bed mobility comments: pt sitting EOB on arrival    Transfers                          Balance Overall balance assessment: Mild deficits observed, not formally tested                                         ADL either performed or assessed with clinical judgement   ADL Overall ADL's : At baseline;Independent                                        General ADL Comments: Pt functioning at ind level for ADLs, ambulating OOB ind per PT. no deficts noted     Vision Baseline Vision/History: 0 No visual deficits Ability to See in Adequate Light: 0 Adequate Patient Visual Report: No change from baseline Vision Assessment?: Yes Eye Alignment: Within Functional Limits Ocular Range of Motion: Within Functional Limits Alignment/Gaze Preference: Within Defined Limits Tracking/Visual Pursuits: Able to track stimulus in all quads without difficulty Saccades: Within functional limits Convergence: Within functional limits Visual Fields: No apparent deficits     Perception Perception: Within Functional Limits       Praxis Praxis: WFL       Pertinent Vitals/Pain Pain Assessment Pain Assessment: No/denies pain     Extremity/Trunk Assessment Upper Extremity Assessment Upper Extremity Assessment: Overall WFL for tasks assessed;Right hand dominant (coordination, strength, ROM WFL)   Lower Extremity Assessment Lower Extremity Assessment: Overall WFL for tasks assessed       Communication Communication Communication: No apparent difficulties   Cognition Arousal: Alert Behavior During Therapy: [facility] for  tasks assessed/performed Cognition: No apparent impairments             OT - Cognition Comments: Pt completing simple math equations accurately, scored 8/10 on the medicog. Missed 1 on the [facility] recall section and made 1 mistake on medication transfer log-pt reports that she didn't know how to correct a mistake so she just left it there. Otherwise pt demonstrated good interpretation of questions and problem solving.                 Following commands: Intact       Cueing  General Comments          Exercises     Shoulder Instructions      Home Living Family/patient expects to be discharged to:: Private residence Living Arrangements: Alone   Type of Home: House Home Access: Stairs to  enter Secretary/administrator of Steps: 4 Entrance Stairs-Rails: Left Home Layout: One level     Bathroom Shower/Tub: Chief Strategy Officer: Standard     Home Equipment: None          Prior Functioning/Environment Prior Level of Function : Independent/Modified Independent;Driving                    OT Problem List: Other (comment) (resolved deficits)   OT Treatment/Interventions:        OT Goals(Current goals can be found in the care plan section)   Acute Rehab OT Goals OT Goal Formulation: All assessment and education complete, DC therapy Time For Goal Achievement: 04/23/24 Potential to Achieve Goals: Good   OT Frequency:       Co-evaluation              AM-PAC OT 6 Clicks Daily Activity     Outcome Measure Help from another person eating meals?: None Help from another person taking care of personal grooming?: None Help from another person toileting, which includes using toliet, bedpan, or urinal?: None Help from another person bathing (including washing, rinsing, drying)?: None Help from another person to put on and taking off regular upper body clothing?: None Help from another person to put on and taking off regular lower body clothing?: None 6 Click Score: 24   End of Session Nurse Communication: Mobility status  Activity Tolerance: Patient tolerated treatment well Patient left: in bed;with call bell/phone within reach;Other (comment) (sitting EOB)  OT Visit Diagnosis: Cognitive communication deficit (R41.841) Symptoms and signs involving cognitive functions: Cerebral infarction (subacute L frontal)                Time: 9143-9072 OT Time Calculation (min): 31 min Charges:  OT General Charges $OT Visit: 1 Visit OT Evaluation $OT Eval Low Complexity: 1 Low OT Treatments $Cognitive Funtion inital: Initial 15 mins  04/10/2024  AB, OTR/L  Acute Rehabilitation Services  Office: [phone]   Curtistine JONETTA Das 04/10/2024,  10:39 AM

## 2024-04-10 NOTE — Progress Notes (Signed)
 Reviewed discharge instructions with patient. She voiced complete understanding. TOC meds handed to her. All belongings gathered by patient. Nurse secretary Virlinda wheeled patient out.

## 2024-04-10 NOTE — Progress Notes (Signed)
 VASCULAR LAB    Bilateral lower extremity venous duplex has been performed.  See CV proc for preliminary results.   Valyncia Wiens, RVT 04/10/2024, 1:49 PM

## 2024-04-10 NOTE — Consult Note (Addendum)
 Hospital Consult    Reason for Consult:  bilateral ICA stenosis Requesting Physician:  Dr. Jonel MRN #:  [id number]  History of Present Illness: This is a 73 y.o. female with pertinent past medical history of CAD s/p CABG, HTN, and HLD who presented yesterday to ER with sudden onset of slurred speech and left facial droop. She says she was Facetiming with her sister when suddenly her mouth twisted to the left and her speech was slurred. She says it lasted just several minutes and resolved. She has never experienced anything like this before. Her sister encouraged her to get evaluated right away. She says she felt find and drove herself to the ER. She denies any visual changes or amaurosis, no upper or lower extremity weakness or numbness. She feels she is at her normal baseline. She does endorse some irregular heart beats/ palpitations. Code stroke was activated on her arrival. Neurology was consulted. MRI shows tiny subacute left subcortical infarct, which is felt to be unrelated to her symptoms.  CT angiogram had suggested 80% calcified proximal left ICA stenosis.  Carotid ultrasound confirms 60-79% right ICA and 80-99% left ICA stenosis. Patient explains that she has known about her carotid stenosis and has previously been followed by her Cardiologist. Vascular surgery was consulted for evaluation of her carotid stenosis.   Past Medical History:  Diagnosis Date   Aortic atherosclerosis (HCC)    Asthma    Carotid artery disease    Carotid US  11/2019: R 1-39; L 60-79   Carotid stenosis    Chronic obstructive pulmonary disease    Coronary artery disease    s/p NSTEMI 11/2019 >> s/p CABG // Echo 2.14.2021: EF 50-55, Gr 1 DD, RVSP 31.9   Diabetes (HCC)    Gout    History of cigarette smoking    History of pulmonary embolism    Pt notes episode in her 46s // Dx with RLL PE at time of NSTEMI in 11/2019 >> Apixaban    HLD (hyperlipidemia)    Hypertension    Hypertensive heart disease with  other congestive heart failure (HCC)    NSVT (nonsustained ventricular tachycardia) (HCC)    Obese    Osteochondral lesion of talar dome    Osteopenia    Pneumonia 11/2019   PVC (premature ventricular contraction)    Skin mass    Stage B ACC/AHA asymptomatic heart failure (HCC)    SVT (supraventricular tachycardia) (HCC)    Vitamin D deficiency     Past Surgical History:  Procedure Laterality Date   ABDOMINAL HYSTERECTOMY     CORONARY ARTERY BYPASS GRAFT N/A 11/29/2019   Procedure: CORONARY ARTERY BYPASS GRAFTING (CABG) using the LIMA to LAD; RIMA to PL; Endoscopic harvested right greater saphenous vein: SVC to Diag; SVC to OM1.;  Surgeon: German Bartlett PEDLAR, MD;  Location: [facility] OR;  Service: Open Heart Surgery;  Laterality: N/A;  bilateral IMA   ENDOVEIN HARVEST OF GREATER SAPHENOUS VEIN Right 11/29/2019   Procedure: Jethro Rubins Of Greater Saphenous Vein;  Surgeon: German Bartlett PEDLAR, MD;  Location: [facility] OR;  Service: Open Heart Surgery;  Laterality: Right;   LEFT HEART CATH AND CORONARY ANGIOGRAPHY N/A 11/27/2019   Procedure: LEFT HEART CATH AND CORONARY ANGIOGRAPHY;  Surgeon: Swaziland, Peter M, MD;  Location: [facility] INVASIVE CV LAB;  Service: Cardiovascular;  Laterality: N/A;   TEE WITHOUT CARDIOVERSION N/A 11/29/2019   Procedure: TRANSESOPHAGEAL ECHOCARDIOGRAM (TEE);  Surgeon: German Bartlett PEDLAR, MD;  Location: [facility] OR;  Service: Open Heart Surgery;  Laterality:  N/A;    No Known Allergies  Prior to Admission medications   Medication Sig Start Date End Date Taking? Authorizing Provider  acetaminophen  (TYLENOL ) 500 MG tablet Take 1,000 mg by mouth every 6 (six) hours as needed for moderate pain or headache.   Yes [provider]  albuterol  (VENTOLIN  HFA) 108 (90 Base) MCG/ACT inhaler Inhale 2 puffs into the lungs every 4 (four) hours as needed for wheezing or shortness of breath.  11/03/19  Yes [provider]  colchicine  0.6 MG tablet Take 0.6 mg by mouth daily as needed (gout  flares). 06/30/21  Yes [provider]  indomethacin  (INDOCIN ) 25 MG capsule Take 1 capsule (25 mg total) by mouth 3 (three) times daily as needed. 10/17/21  Yes Khatri, Hina, PA-C  losartan-hydrochlorothiazide (HYZAAR) 50-12.5 MG tablet Take 1 tablet by mouth daily. 08/14/21  Yes [provider]  metoprolol  succinate (TOPROL  XL) 25 MG 24 hr tablet Take 1 tablet (25 mg total) by mouth daily. 02/23/24  Yes Swaziland, Peter M, MD    Social History   Socioeconomic History   Marital status: Single    Spouse name: Not on file   Number of children: Not on file   Years of education: Not on file   Highest education level: Not on file  Occupational History   Not on file  Tobacco Use   Smoking status: Former    Types: Cigarettes   Smokeless tobacco: Never  Vaping Use   Vaping status: Never Used  Substance and Sexual Activity   Alcohol use: Yes    Alcohol/week: 0.0 standard drinks of alcohol    Comment: ocaasional   Drug use: Never   Sexual activity: Not on file  Other Topics Concern   Not on file  Social History Narrative   Not on file   Social Drivers of Health   Financial Resource Strain: Not on file  Food Insecurity: No Food Insecurity (04/10/2024)   Hunger Vital Sign    Worried About Running Out of Food in the Last Year: Never true    Ran Out of Food in the Last Year: Never true  Transportation Needs: No Transportation Needs (04/10/2024)   PRAPARE - Administrator, Civil Service (Medical): No    Lack of Transportation (Non-Medical): No  Physical Activity: Not on file  Stress: Not on file  Social Connections: Moderately Integrated (04/10/2024)   Social Connection and Isolation Panel    Frequency of Communication with Friends and Family: Three times a week    Frequency of Social Gatherings with Friends and Family: Three times a week    Attends Religious Services: More than 4 times per year    Active Member of Clubs or Organizations: Yes    Attends Tax inspector Meetings: More than 4 times per year    Marital Status: Never married  Intimate Partner Violence: Not At Risk (04/10/2024)   Humiliation, Afraid, Rape, and Kick questionnaire    Fear of Current or Ex-Partner: No    Emotionally Abused: No    Physically Abused: No    Sexually Abused: No     Family History  Problem Relation Age of Onset   Coronary artery disease Mother     ROS: Otherwise negative unless mentioned in HPI  Physical Examination  Vitals:   04/10/24 0744 04/10/24 1112  BP: 134/77 (!) 182/71  Pulse: 62 72  Resp: 14 18  Temp: 98 F (36.7 C) 98.1 F (36.7 C)  SpO2: 98%  100%   Body mass index is 30.37 kg/m.  General:  WDWN in NAD Gait: Not observed HENT: WNL, normocephalic Pulmonary: normal non-labored breathing, without wheezing Cardiac: regular Abdomen:  soft Vascular Exam/Pulses: 2+ radial, 2+ DP bilaterally Extremities: moving all extremities without deficits Musculoskeletal: no muscle wasting or atrophy  Neurologic: A&O X 3;  No focal weakness or paresthesias are detected; speech is fluent/normal Psychiatric:  The pt has Normal affect. Lymph:  Unremarkable  CBC    Component Value Date/Time   WBC 6.2 04/10/2024 0228   RBC 3.66 (L) 04/10/2024 0228   HGB 11.4 (L) 04/10/2024 0228   HCT 34.6 (L) 04/10/2024 0228   PLT 219 04/10/2024 0228   MCV 94.5 04/10/2024 0228   MCV 88.3 09/17/2016 1209   MCH 31.1 04/10/2024 0228   MCHC 32.9 04/10/2024 0228   RDW 12.9 04/10/2024 0228   LYMPHSABS 2.2 04/09/2024 1801   MONOABS 0.9 04/09/2024 1801   EOSABS 0.1 04/09/2024 1801   BASOSABS 0.0 04/09/2024 1801    BMET    Component Value Date/Time   NA 140 04/10/2024 0228   NA 142 02/06/2020 0807   K 3.8 04/10/2024 0228   CL 105 04/10/2024 0228   CO2 23 04/10/2024 0228   GLUCOSE 156 (H) 04/10/2024 0228   BUN 23 04/10/2024 0228   BUN 16 02/06/2020 0807   CREATININE 1.03 (H) 04/10/2024 0228   CREATININE 0.90 07/17/2016 1334   CALCIUM  9.0  04/10/2024 0228   GFRNONAA 58 (L) 04/10/2024 0228   GFRNONAA 66 03/12/2016 1040   GFRAA 77 02/06/2020 0807   GFRAA 76 03/12/2016 1040    COAGS: Lab Results  Component Value Date   INR 1.0 04/09/2024   INR 1.5 (H) 11/29/2019   INR 1.2 11/28/2019     Non-Invasive Vascular Imaging:   VAS US  Carotid duplex: Summary:  Right Carotid: Velocities in the right ICA are consistent with a 60-79% stenosis. The ECA appears >50% stenosed.   Left Carotid: Velocities in the left ICA are consistent with a 80-99% stenosis. The ECA appears >50% stenosed.   Vertebrals:  Bilateral vertebral arteries demonstrate antegrade flow.  Subclavians: Not assessed  CT Angio head neck w wo CM: IMPRESSION: CTA neck:   1. The common carotid and internal carotid arteries are patent in the neck. Atherosclerotic plaque bilaterally as described. Most notably, calcified and noncalcified plaque results in a severe (estimated greater than 80%) stenosis of the distal left common carotid artery. Additionally, there is moderate atherosclerotic plaque within the distal right common carotid artery and about the right carotid bifurcation (with a focus of ulcerated plaque at this site). 2. Vertebral arteries patent within the neck without stenosis. 3. Aortic Atherosclerosis (ICD10-I70.0). 4. Foci of bandlike and nodular pulmonary scarring within the bilateral lungs at the imaged levels. Consider a dedicated chest CT for further evaluation.   CTA head:   1. No proximal intracranial large vessel occlusion identified. 2. Intracranial atherosclerotic disease most notably as follows. 3. Sites of moderate stenosis within the bilateral intracranial internal carotid arteries. 4. Moderate-to-severe stenosis within the left anterior cerebral artery at the A2/A3 junction.  MRI small acute or early subacute infarct in left posterior frontal white matter  Statin:  No. Has intolerance Beta Blocker:  Yes.   Aspirin :  Yes.    ACEI:  No. ARB:  Yes.   CCB use:  No Other antiplatelets/anticoagulants:  Yes.   Plavix    ASSESSMENT/PLAN: This is a 73 y.o. female who presented with sudden onset of  left sided facial droop and slurred speech. This quickly resolved. She has no other neurological deficits. CTA showing severe left > right ICA calcific stenosis. Carotid duplex confirms 60-79% right ICA and 80-99% left ICA stenosis. MRI small acute or early subacute infarct in left posterior frontal white matter. Suspect she is only symptomatic from her high grade left ICA stenosis. She will need outpatient follow up to continue to monitor the right ICA stenosis. She has been initiated on Asprin and Plavix . She has a statin intolerance. Discussed importance of adequate blood pressure control with her as well. Recommend left TCAR vs Carotid Endarterectomy for stroke risk reduction.  Based on OR availability/ Holiday this week likely will be scheduled for next week. She could be discharged from our standpoint and return next week for her surgery. The on cal surgeon, Dr. Lanis will review her imaging studies and evaluate patient later this afternoon to discuss further surgical planning.   Teretha Damme PA-C [facility] [phone] 04/10/2024  3:31 PM\  VASCULAR STAFF ADDENDUM: I have independently interviewed and examined the patient. I agree with the above.   In short, patient is a 73 year old female from the [place] area who moved down to   to take care of her mother years ago.  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 She presents after TIA with slurred speech and left facial droop.  MRI showed right sided infarct, no left-sided infarct with bilateral carotid stenosis, left greater than right.  I had a long conversation with Jordynne regarding the above.  I also reached out and discussed her case with Dr. Rosemarie.   Being that the left side has a greater percent stenosis, and was also symptomatic with lesion identified in the cortex, he asked that I revascularize the left prior to proceeding with the right.  After discussing the risks and benefits of the above, Keylie elected to proceed. This will be performed in the outpatient setting, likely next Tuesday.  I will have my office reach out to her.  I asked that she continue her current medication regimen.  She does not need to hold dual antiplatelet therapy prior to surgery.  [name] Lanis MD [facility] Phone Number: [phone] 04/10/2024 4:31 PM

## 2024-04-10 NOTE — Evaluation (Signed)
 Speech Language Pathology Evaluation Patient Details Name: Miranda [name] MRN: [id number] DOB: [birth year] Today's Date: 04/10/2024 Time: 8854-8843 SLP Time Calculation (min) (ACUTE ONLY): 11 min  Problem List:  Patient Active Problem List   Diagnosis Date Noted   History of pulmonary embolism 04/10/2024   Acute CVA (cerebrovascular accident) (HCC) 04/09/2024   ARF (acute renal failure) (HCC) 04/09/2024   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   NSTEMI (non-ST elevated myocardial infarction) (HCC) 11/26/2019   Multifocal pneumonia 11/26/2019   Hyperlipidemia 11/26/2019   Anemia 11/26/2019   COPD exacerbation (HCC) 08/02/2017   History of gout 09/18/2016   Hypertension 03/12/2016   Past Medical History:  Past Medical History:  Diagnosis Date   Aortic atherosclerosis (HCC)    Asthma    Carotid artery disease    Carotid US  11/2019: R 1-39; L 60-79   Carotid stenosis    Chronic obstructive pulmonary disease    Coronary artery disease    s/p NSTEMI 11/2019 >> s/p CABG // Echo 2.14.2021: EF 50-55, Gr 1 DD, RVSP 31.9   Diabetes (HCC)    Gout    History of cigarette smoking    History of pulmonary embolism    Pt notes episode in her 80s // Dx with RLL PE at time of NSTEMI in 11/2019 >> Apixaban    HLD (hyperlipidemia)    Hypertension    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)    NSVT (nonsustained ventricular tachycardia) (HCC)    Obese    Osteochondral lesion of talar dome    Osteopenia    Pneumonia 11/2019   PVC (premature ventricular contraction)    Skin mass    Stage B ACC/AHA asymptomatic heart failure (HCC)    SVT (supraventricular tachycardia) (HCC)    Vitamin D deficiency    Past Surgical History:  Past Surgical History:  Procedure Laterality Date   ABDOMINAL HYSTERECTOMY     CORONARY ARTERY BYPASS GRAFT N/A 11/29/2019   Procedure: CORONARY ARTERY BYPASS GRAFTING (CABG) using the LIMA to LAD; RIMA to PL; Endoscopic harvested right greater saphenous  [name]: SVC to Diag; SVC to OM1.;  Surgeon: Miranda Bartlett PEDLAR, MD;  Location: [facility] OR;  Service: Open Heart Surgery;  Laterality: N/A;  bilateral IMA   ENDOVEIN HARVEST OF GREATER SAPHENOUS [name] Right 11/29/2019   Procedure: Miranda [name];  Surgeon: Miranda Bartlett PEDLAR, MD;  Location: [facility];  Service: Open Heart Surgery;  Laterality: Right;   LEFT HEART CATH AND CORONARY ANGIOGRAPHY N/A 11/27/2019   Procedure: LEFT HEART CATH AND CORONARY ANGIOGRAPHY;  Surgeon: Swaziland, Peter M, MD;  Location: [facility] INVASIVE CV LAB;  Service: Cardiovascular;  Laterality: N/A;   TEE WITHOUT CARDIOVERSION N/A 11/29/2019   Procedure: TRANSESOPHAGEAL ECHOCARDIOGRAM (TEE);  Surgeon: Miranda Bartlett PEDLAR, MD;  Location: [facility] OR;  Service: Open Heart Surgery;  Laterality: N/A;   HPI:  Pt is a 73 yo female presenting to [facility] ED on 04/09/24 with c/o slurred speech and L sided facial droop. MRI shows subacute L frontal infarct, CTA shows multifocal stenosis of R ICA with 80% stenosis of L common carotid. PMH of Aortic atherosclerosis, CAD, COPD, DM, gout, hx PE, HLD, HTN, osteopenia.   Assessment / Plan / Recommendation Clinical Impression  Pt, who lives alone, voiced no concern with speech-language-cognition. Oromotor abilities are [facility]- stated droop is back to baseline from yesterday. Her language and speech are within normal limits. Pt given the SLUMS and scored a 26/30 with 27/30 considered normal  range. Pt missed 2 points on memory subtest and 2 points on  answering question on paragraph recall. Discussed memory compensatory strategies and pt states she writes information down she needs to remember and checks phone and text when Dr's offices text with appointment reminders. Pt appears to be functional with her cognition and does need additional ST services and pt in agreement. She does not forsee difficulty managing her finances or medications.    SLP Assessment  SLP Recommendation/Assessment: Patient does not need  any further Speech Language Pathology Services SLP Visit Diagnosis: Cognitive communication deficit (R41.841)     Assistance Recommended at Discharge  None  Functional Status Assessment Patient has not had a recent decline in their functional status  Frequency and Duration           SLP Evaluation Cognition  Overall Cognitive Status: Within Functional Limits for tasks assessed (was one point below normal range- overall functional, see impression statement) Arousal/Alertness: Awake/alert Orientation Level: Oriented to person;Oriented to time;Oriented to place (did not ask situation) Year: 2025 Day of Week: Correct Attention: Sustained Sustained Attention: Appears intact Memory:  (recalled 3/5 words, other 2 with choice and semantic cue) Awareness: Appears intact Problem Solving: Appears intact Safety/Judgment: Appears intact       Comprehension  Auditory Comprehension Overall Auditory Comprehension: Appears within functional limits for tasks assessed Commands: Within Functional Limits Visual Recognition/Discrimination Discrimination: Not tested Reading Comprehension Reading Status: Not tested    Expression Expression Primary Mode of Expression: Verbal Verbal Expression Overall Verbal Expression: Appears within functional limits for tasks assessed Initiation: No impairment Level of Generative/Spontaneous Verbalization: Conversation Repetition:  (NT) Naming: No impairment Pragmatics: No impairment Written Expression Dominant Hand: Right Written Expression: Not tested   Oral / Motor  Oral Motor/Sensory Function Overall Oral Motor/Sensory Function: Within functional limits Motor Speech Overall Motor Speech: Appears within functional limits for tasks assessed Respiration: Within functional limits Phonation: Normal Resonance: Within functional limits Articulation: Within functional limitis Intelligibility: Intelligible Motor Planning: Within functional limits Motor  Speech Errors: Not applicable            Miranda [name] 04/10/2024, 12:17 PM

## 2024-04-10 NOTE — Evaluation (Addendum)
 Physical Therapy Brief Evaluation and Discharge Note Patient Details Name: Miranda [name] MRN: [id number] DOB: July 22, 1951 Today's Date: 04/10/2024   History of Present Illness  Pt is a 73 yo female presenting to [facility] ED on 04/09/24 with c/o slurred speech and L sided facial droop. MRI shows subacute L frontal infarct, CTA shows multifocal stenosis of R ICA with 80% stenosis of L common carotid. PMH of Aortic atherosclerosis, CAD, COPD, DM, gout, hx PE, HLD, HTN, osteopenia.   Clinical Impression  Patient evaluated by Physical Therapy with no further acute PT needs identified. Pt reports resolution of her symptoms and feeling 100% back to her baseline. Pt overall is mobilizing well, ambulating hallway distances and negotiated steps independently. Scored 30/30 on the Functional Gait Assessment. Reviewed BEFAST stroke signs/symptoms and activity recommendations according to [facility] guidelines. All education has been completed and the patient has no further questions. No follow-up Physical Therapy or equipment needs. PT is signing off. Thank you for this referral.      PT Assessment Patient does not need any further PT services  Assistance Needed at Discharge  None    Equipment Recommendations None recommended by PT  Recommendations for Other Services       Precautions/Restrictions Precautions Precautions: None Restrictions Weight Bearing Restrictions Per Provider Order: No        Mobility  Bed Mobility       General bed mobility comments: Sitting EOB upon arrival  Transfers Overall transfer level: Independent                      Ambulation/Gait Ambulation/Gait assistance: Independent Gait Distance (Feet): 400 Feet Assistive device: None Gait Pattern/deviations: WFL(Within Functional Limits)      Home Activity Instructions    Stairs Stairs: Yes Stairs assistance: Independent Stair Management: No rails Number of Stairs: 2    Modified Rankin (Stroke Patients  Only) Modified Rankin (Stroke Patients Only) Pre-Morbid Rankin Score: No symptoms Modified Rankin: No symptoms      Balance Overall balance assessment: Independent                        Pertinent Vitals/Pain PT - Brief Vital Signs All Vital Signs Stable: Yes Pain Assessment Pain Assessment: No/denies pain     Home Living Family/patient expects to be discharged to:: Private residence Living Arrangements: Alone Available Help at Discharge: Family Home Environment: Stairs to enter  [employer] of Steps: 4 Home Equipment: None        Prior Function Level of Independence: Independent      UE/LE Assessment   UE ROM/Strength/Tone/Coordination: WFL    LE ROM/Strength/Tone/Coordination: [facility]      Communication   Communication Communication: No apparent difficulties     Cognition Overall Cognitive Status: Appears within functional limits for tasks assessed/performed       General Comments      Exercises     Assessment/Plan    PT Problem List         PT Visit Diagnosis Other symptoms and signs involving the nervous system (R29.898)    No Skilled PT All education completed;Patient at baseline level of functioning;Patient is independent with all acitivity/mobility   Co-evaluation                AMPAC 6 Clicks Help needed turning from your back to your side while in a flat bed without using bedrails?: None Help needed moving from lying on your back to sitting on the  side of a flat bed without using bedrails?: None Help needed moving to and from a bed to a chair (including a wheelchair)?: None Help needed standing up from a chair using your arms (e.g., wheelchair or bedside chair)?: None Help needed to walk in hospital room?: None Help needed climbing 3-5 steps with a railing? : None 6 Click Score: 24      End of Session   Activity Tolerance: Patient tolerated treatment well Patient left: in bed;with call bell/phone within  reach Nurse Communication: Mobility status PT Visit Diagnosis: Other symptoms and signs involving the nervous system (R29.898)     Time: 9171-9150 PT Time Calculation (min) (ACUTE ONLY): 21 min  Charges:   PT Evaluation $PT Eval Low Complexity: 1 Low      Aleck [name], PT, DPT Acute Rehabilitation Services Office [phone]   Miranda [name]  04/10/2024, 9:07 AM

## 2024-04-10 NOTE — Progress Notes (Signed)
 TOC meds left with Diplomatic Services operational officer. Placed  at charge nurse computer

## 2024-04-11 ENCOUNTER — Other Ambulatory Visit: Payer: Self-pay

## 2024-04-11 ENCOUNTER — Other Ambulatory Visit: Payer: Self-pay | Admitting: Neurology

## 2024-04-11 ENCOUNTER — Telehealth (HOSPITAL_COMMUNITY): Payer: Self-pay | Admitting: Cardiology

## 2024-04-11 DIAGNOSIS — N186 End stage renal disease: Secondary | ICD-10-CM

## 2024-04-11 DIAGNOSIS — I6522 Occlusion and stenosis of left carotid artery: Secondary | ICD-10-CM

## 2024-04-11 NOTE — Telephone Encounter (Signed)
 Patient called and cancelled echocardiogram scheduled for 04/12/24 and di not wish to reschedule. Order will be removed from the echo WQ. Thank you.

## 2024-04-12 ENCOUNTER — Encounter: Payer: Self-pay | Admitting: Neurology

## 2024-04-12 ENCOUNTER — Other Ambulatory Visit: Payer: Self-pay

## 2024-04-12 ENCOUNTER — Ambulatory Visit (HOSPITAL_COMMUNITY)

## 2024-04-12 ENCOUNTER — Ambulatory Visit: Admitting: Pharmacist

## 2024-04-12 ENCOUNTER — Encounter (HOSPITAL_COMMUNITY): Payer: Self-pay | Admitting: Vascular Surgery

## 2024-04-12 NOTE — Progress Notes (Signed)
 PCP - Luke Miyamoto, NP Cardiologist - [facility] ([place])  Chest x-ray - n/a EKG - 04/09/24 Stress Test - n/a ECHO - 04/10/24 Cardiac Cath - 11/27/19  ICD Pacemaker/Loop - n/a  Sleep Study -  n/a  Diabetes Type 2, no meds, does not check blood sugar   Aspirin  & Plavix :  Continue per MD.  NPO  Anesthesia review: Yes  STOP now taking any Aspirin  (unless otherwise instructed by your surgeon), Aleve , Naproxen , Ibuprofen, Motrin, Advil, Indocin , Goody's, BC's, all herbal medications, fish oil, and all vitamins.   Coronavirus Screening Do you have any of the following symptoms:  Cough Occasional Fever (>100.21F)  yes/no: No Runny nose Occasional r/t allergies Sore throat yes/no: No Difficulty breathing/shortness of breath  yes/no: No  Have you traveled in the last 14 days and where? yes/no: No  Patient verbalized understanding of instructions that were given via phone.

## 2024-04-13 NOTE — Anesthesia Preprocedure Evaluation (Addendum)
 Anesthesia Evaluation  Patient identified by MRN, date of birth, ID band Patient awake    Reviewed: Allergy & Precautions, NPO status , Patient's Chart, lab work & pertinent test results  Airway Mallampati: III  TM Distance: >3 FB Neck ROM: Full    Dental  (+) Dental Advisory Given, Teeth Intact, Loose, Poor Dentition   Pulmonary asthma , pneumonia, COPD, former smoker   Pulmonary exam normal breath sounds clear to auscultation       Cardiovascular hypertension, Pt. on medications and Pt. on home beta blockers + CAD, + Past MI, + CABG and +CHF  + Valvular Problems/Murmurs MR  Rhythm:Irregular Rate:Normal  Echo 04/10/2024  1. Left ventricular ejection fraction, by estimation, is 55 to 60%. The left ventricle has normal function. The left ventricle has no regional wall motion abnormalities. Left ventricular diastolic parameters were normal.   2. Right ventricular systolic function is normal. The right ventricular size is normal.   3. Left atrial size was mildly dilated.   4. The mitral valve is normal in structure. Mild mitral valve regurgitation. No evidence of mitral stenosis.   5. The aortic valve is normal in structure. Aortic valve regurgitation is not visualized. No aortic stenosis is present.   6. The inferior vena cava is normal in size with greater than 50% respiratory variability, suggesting right atrial pressure of 3 mmHg.   7. Frequent ectopy during study.     Neuro/Psych CVA    GI/Hepatic negative GI ROS, Neg liver ROS,,,  Endo/Other  diabetes    Renal/GU Renal disease     Musculoskeletal  (+) Arthritis ,    Abdominal   Peds  Hematology  (+) Blood dyscrasia, anemia   Anesthesia Other Findings   Reproductive/Obstetrics                              Lab Results  Component Value Date   WBC 6.2 04/10/2024   HGB 11.4 (L) 04/10/2024   HCT 34.6 (L) 04/10/2024   MCV 94.5 04/10/2024    PLT 219 04/10/2024   Lab Results  Component Value Date   CREATININE 1.03 (H) 04/10/2024   BUN 23 04/10/2024   NA 140 04/10/2024   K 3.8 04/10/2024   CL 105 04/10/2024   CO2 23 04/10/2024    Anesthesia Physical Anesthesia Plan  ASA: 3  Anesthesia Plan: General   Post-op Pain Management: Tylenol  PO (pre-op)*   Induction: Intravenous  PONV Risk Score and Plan: 3 and Treatment may vary due to age or medical condition, Ondansetron  and Dexamethasone   Airway Management Planned: Oral ETT and Video Laryngoscope Planned  Additional Equipment: Arterial line  Intra-op Plan:   Post-operative Plan: Extubation in OR  Informed Consent: I have reviewed the patients History and Physical, chart, labs and discussed the procedure including the risks, benefits and alternatives for the proposed anesthesia with the patient or authorized representative who has indicated his/her understanding and acceptance.     Dental advisory given  Plan Discussed with: CRNA  Anesthesia Plan Comments: (Pt had a single saltine cracker with meds ad ~4 am. I had a long discussion with the patient and Dr. Lanis regarding increased risk of aspiration with induction. I explained that I couldn't quantify the risk, but that I felt is was minimal at 4 hours. Delayed to OR for NPO.  Risks of anesthesia explained at length. This includes, but is not limited to, sore throat, damage to teeth, lips  gums, tongue and vocal cords, nausea and vomiting, reactions to medications, stroke, heart attack, and death. All patient questions were answered and the patient wishes to proceed.\    2 x piv  PAT note by Lynwood Hope, PA-C: 73 year old female with pertinent history including CAD s/p CABG x 4 in 2021, HTN, HLD, frequent PVCs, carotid disease.  Recent admission 6/29 through 04/10/2024 after presenting with slurred speech, left facial droop.  Symptoms lasted 20 minutes then resolved.  MRI of the brain showed lacunar infarct in  left frontal lobe.  Noninvasive angiography showed stenosis in bilateral carotids.  Echo showed no cardiogenic source of embolism.  No arrhythmia/atrial fibrillation on monitoring.  Carotid ultrasound showed 80 to 99% stenosis on the left, 60 to 79% on the right.  She was scheduled for outpatient carotid revascularization on 04/18/2024.  During admission she also had new diagnosis of diabetes with A1c of 7.8.  Initiation of antidiabetic medication was deferred to PCP follow-up.  CMP and CBC 04/10/2024 reviewed, mild anemia hemoglobin 11.4, otherwise unremarkable.  Patient will need day of surgery evaluation.  EKG 04/09/2024: Sinus rhythm.  Rate 74. Multiple ventricular premature complexes. Borderline right axis deviation. Borderline T abnormalities, lateral leads  TTE 04/10/2024: 1. Left ventricular ejection fraction, by estimation, is 55 to 60%. The  left ventricle has normal function. The left ventricle has no regional  wall motion abnormalities. Left ventricular diastolic parameters were  normal.  2. Right ventricular systolic function is normal. The right ventricular  size is normal.  3. Left atrial size was mildly dilated.  4. The mitral valve is normal in structure. Mild mitral valve  regurgitation. No evidence of mitral stenosis.  5. The aortic valve is normal in structure. Aortic valve regurgitation is  not visualized. No aortic stenosis is present.  6. The inferior vena cava is normal in size with greater than 50%  respiratory variability, suggesting right atrial pressure of 3 mmHg.  7. Frequent ectopy during study.    )         Anesthesia Quick Evaluation

## 2024-04-13 NOTE — Progress Notes (Signed)
 Anesthesia Chart Review: Same day workup  73 year old female with pertinent history including CAD s/p CABG x 4 in 2021, HTN, HLD, frequent PVCs, carotid disease.  Recent admission 6/29 through 04/10/2024 after presenting with slurred speech, left facial droop.  Symptoms lasted 20 minutes then resolved.  MRI of the brain showed lacunar infarct in left frontal lobe.  Noninvasive angiography showed stenosis in bilateral carotids.  Echo showed no cardiogenic source of embolism.  No arrhythmia/atrial fibrillation on monitoring.  Carotid ultrasound showed 80 to 99% stenosis on the left, 60 to 79% on the right.  She was scheduled for outpatient carotid revascularization on 04/18/2024.  During admission she also had new diagnosis of diabetes with A1c of 7.8.  Initiation of antidiabetic medication was deferred to PCP follow-up.  CMP and CBC 04/10/2024 reviewed, mild anemia hemoglobin 11.4, otherwise unremarkable.  Patient will need day of surgery evaluation.  EKG 04/09/2024: Sinus rhythm.  Rate 74. Multiple ventricular premature complexes. Borderline right axis deviation. Borderline T abnormalities, lateral leads  TTE 04/10/2024: 1. Left ventricular ejection fraction, by estimation, is 55 to 60%. The  left ventricle has normal function. The left ventricle has no regional  wall motion abnormalities. Left ventricular diastolic parameters were  normal.   2. Right ventricular systolic function is normal. The right ventricular  size is normal.   3. Left atrial size was mildly dilated.   4. The mitral valve is normal in structure. Mild mitral valve  regurgitation. No evidence of mitral stenosis.   5. The aortic valve is normal in structure. Aortic valve regurgitation is  not visualized. No aortic stenosis is present.   6. The inferior vena cava is normal in size with greater than 50%  respiratory variability, suggesting right atrial pressure of 3 mmHg.   7. Frequent ectopy during study.      Lynwood Geofm RIGGERS [facility] Short Stay Center/Anesthesiology Phone [phone] 04/13/2024 4:20 PM

## 2024-04-18 ENCOUNTER — Inpatient Hospital Stay (HOSPITAL_COMMUNITY): Payer: Self-pay | Admitting: Physician Assistant

## 2024-04-18 ENCOUNTER — Encounter (HOSPITAL_COMMUNITY)
Admission: RE | Disposition: A | Discharge status: Home / Self Care | Payer: Self-pay | Source: Home / Self Care | Attending: Vascular Surgery

## 2024-04-18 ENCOUNTER — Other Ambulatory Visit: Payer: Self-pay

## 2024-04-18 ENCOUNTER — Inpatient Hospital Stay (HOSPITAL_COMMUNITY)
Admission: RE | Admit: 2024-04-18 | Discharge: 2024-04-20 | DRG: 039 | Disposition: A | Discharge status: Home / Self Care | Attending: Vascular Surgery | Admitting: Vascular Surgery

## 2024-04-18 ENCOUNTER — Encounter (HOSPITAL_COMMUNITY): Payer: Self-pay | Admitting: Vascular Surgery

## 2024-04-18 DIAGNOSIS — Z6830 Body mass index (BMI) 30.0-30.9, adult: Secondary | ICD-10-CM | POA: Diagnosis not present

## 2024-04-18 DIAGNOSIS — Z8673 Personal history of transient ischemic attack (TIA), and cerebral infarction without residual deficits: Secondary | ICD-10-CM

## 2024-04-18 DIAGNOSIS — J4489 Other specified chronic obstructive pulmonary disease: Secondary | ICD-10-CM | POA: Diagnosis present

## 2024-04-18 DIAGNOSIS — Z7902 Long term (current) use of antithrombotics/antiplatelets: Secondary | ICD-10-CM | POA: Diagnosis not present

## 2024-04-18 DIAGNOSIS — Z7982 Long term (current) use of aspirin: Secondary | ICD-10-CM | POA: Diagnosis not present

## 2024-04-18 DIAGNOSIS — I110 Hypertensive heart disease with heart failure: Secondary | ICD-10-CM

## 2024-04-18 DIAGNOSIS — E119 Type 2 diabetes mellitus without complications: Secondary | ICD-10-CM | POA: Diagnosis present

## 2024-04-18 DIAGNOSIS — I6523 Occlusion and stenosis of bilateral carotid arteries: Principal | ICD-10-CM | POA: Diagnosis present

## 2024-04-18 DIAGNOSIS — I2510 Atherosclerotic heart disease of native coronary artery without angina pectoris: Secondary | ICD-10-CM | POA: Diagnosis present

## 2024-04-18 DIAGNOSIS — Z951 Presence of aortocoronary bypass graft: Secondary | ICD-10-CM | POA: Diagnosis not present

## 2024-04-18 DIAGNOSIS — E669 Obesity, unspecified: Secondary | ICD-10-CM | POA: Diagnosis present

## 2024-04-18 DIAGNOSIS — I6529 Occlusion and stenosis of unspecified carotid artery: Principal | ICD-10-CM | POA: Diagnosis present

## 2024-04-18 DIAGNOSIS — Z86711 Personal history of pulmonary embolism: Secondary | ICD-10-CM

## 2024-04-18 DIAGNOSIS — I6522 Occlusion and stenosis of left carotid artery: Secondary | ICD-10-CM

## 2024-04-18 DIAGNOSIS — M109 Gout, unspecified: Secondary | ICD-10-CM | POA: Diagnosis present

## 2024-04-18 DIAGNOSIS — I509 Heart failure, unspecified: Secondary | ICD-10-CM

## 2024-04-18 DIAGNOSIS — I252 Old myocardial infarction: Secondary | ICD-10-CM

## 2024-04-18 DIAGNOSIS — Z8249 Family history of ischemic heart disease and other diseases of the circulatory system: Secondary | ICD-10-CM | POA: Diagnosis not present

## 2024-04-18 DIAGNOSIS — Z888 Allergy status to other drugs, medicaments and biological substances status: Secondary | ICD-10-CM

## 2024-04-18 DIAGNOSIS — E785 Hyperlipidemia, unspecified: Secondary | ICD-10-CM | POA: Diagnosis present

## 2024-04-18 DIAGNOSIS — I10 Essential (primary) hypertension: Secondary | ICD-10-CM | POA: Diagnosis present

## 2024-04-18 DIAGNOSIS — Z87891 Personal history of nicotine dependence: Secondary | ICD-10-CM

## 2024-04-18 DIAGNOSIS — Z79899 Other long term (current) drug therapy: Secondary | ICD-10-CM | POA: Diagnosis not present

## 2024-04-18 HISTORY — DX: Unspecified osteoarthritis, unspecified site: M19.90

## 2024-04-18 HISTORY — PX: PATCH ANGIOPLASTY: SHX6230

## 2024-04-18 HISTORY — PX: ENDARTERECTOMY: SHX5162

## 2024-04-18 LAB — SURGICAL PCR SCREEN
MRSA, PCR: NEGATIVE
Staphylococcus aureus: NEGATIVE

## 2024-04-18 LAB — TYPE AND SCREEN
ABO/RH(D): A POS
Antibody Screen: NEGATIVE

## 2024-04-18 LAB — GLUCOSE, CAPILLARY
Glucose-Capillary: 156 mg/dL — ABNORMAL HIGH (ref 70–99)
Glucose-Capillary: 145 mg/dL — ABNORMAL HIGH (ref 70–99)

## 2024-04-18 SURGERY — ENDARTERECTOMY, CAROTID
Anesthesia: General | Site: Neck | Laterality: Left

## 2024-04-18 MED ORDER — PROTAMINE SULFATE 10 MG/ML IV SOLN
INTRAVENOUS | Status: DC | PRN
  Administered 2024-04-18: 50 mg via INTRAVENOUS

## 2024-04-18 MED ORDER — FENTANYL CITRATE (PF) 250 MCG/5ML IJ SOLN
INTRAMUSCULAR | Status: AC
  Filled 2024-04-18: qty 5

## 2024-04-18 MED ORDER — ALBUMIN HUMAN 5 % IV SOLN
12.5000 g | Freq: Once | INTRAVENOUS | Status: AC
  Administered 2024-04-18: 12.5 g via INTRAVENOUS

## 2024-04-18 MED ORDER — HEPARIN SODIUM (PORCINE) 1000 UNIT/ML IJ SOLN
INTRAMUSCULAR | Status: DC | PRN
  Administered 2024-04-18: 9000 [IU] via INTRAVENOUS
  Administered 2024-04-18: 3000 [IU] via INTRAVENOUS
  Administered 2024-04-18: 1000 [IU] via INTRAVENOUS

## 2024-04-18 MED ORDER — ONDANSETRON HCL 4 MG/2ML IJ SOLN
INTRAMUSCULAR | Status: DC | PRN
  Administered 2024-04-18: 4 mg via INTRAVENOUS

## 2024-04-18 MED ORDER — ACETAMINOPHEN 500 MG PO TABS
1000.0000 mg | ORAL_TABLET | Freq: Once | ORAL | Status: AC
  Administered 2024-04-18: 1000 mg via ORAL
  Filled 2024-04-18: qty 2

## 2024-04-18 MED ORDER — DEXAMETHASONE SODIUM PHOSPHATE 10 MG/ML IJ SOLN
INTRAMUSCULAR | Status: DC | PRN
  Administered 2024-04-18: 10 mg via INTRAVENOUS

## 2024-04-18 MED ORDER — LACTATED RINGERS IV SOLN: INTRAVENOUS | Status: DC | PRN

## 2024-04-18 MED ORDER — SODIUM CHLORIDE 0.9 % IV SOLN
0.1500 ug/kg/min | INTRAVENOUS | Status: AC
  Administered 2024-04-18: .05 ug/kg/min via INTRAVENOUS
  Filled 2024-04-18: qty 2000

## 2024-04-18 MED ORDER — EPHEDRINE SULFATE-NACL 50-0.9 MG/10ML-% IV SOSY
PREFILLED_SYRINGE | INTRAVENOUS | Status: DC | PRN
  Administered 2024-04-18: 10 mg via INTRAVENOUS

## 2024-04-18 MED ORDER — HYDRALAZINE HCL 20 MG/ML IJ SOLN: 5.0000 mg | INTRAMUSCULAR | Status: DC | PRN

## 2024-04-18 MED ORDER — SUCCINYLCHOLINE CHLORIDE 200 MG/10ML IV SOSY
PREFILLED_SYRINGE | INTRAVENOUS | Status: DC | PRN
  Administered 2024-04-18: 100 mg via INTRAVENOUS

## 2024-04-18 MED ORDER — SODIUM CHLORIDE 0.9 % IV SOLN: INTRAVENOUS | Status: DC

## 2024-04-18 MED ORDER — DOCUSATE SODIUM 100 MG PO CAPS
100.0000 mg | ORAL_CAPSULE | Freq: Every day | ORAL | Status: DC
  Filled 2024-04-18 (×2): qty 1

## 2024-04-18 MED ORDER — MIDAZOLAM HCL 2 MG/2ML IJ SOLN
INTRAMUSCULAR | Status: DC | PRN
  Administered 2024-04-18: 2 mg via INTRAVENOUS

## 2024-04-18 MED ORDER — HEMOSTATIC AGENTS (NO CHARGE) OPTIME
TOPICAL | Status: DC | PRN
  Administered 2024-04-18 (×2): 1 via TOPICAL

## 2024-04-18 MED ORDER — LIDOCAINE-EPINEPHRINE (PF) 1 %-1:200000 IJ SOLN
INTRAMUSCULAR | Status: DC | PRN
  Administered 2024-04-18: 5 mL

## 2024-04-18 MED ORDER — MIDAZOLAM HCL 2 MG/2ML IJ SOLN
INTRAMUSCULAR | Status: AC
  Filled 2024-04-18: qty 2

## 2024-04-18 MED ORDER — CHLORHEXIDINE GLUCONATE CLOTH 2 % EX PADS: 6.0000 | MEDICATED_PAD | Freq: Once | CUTANEOUS | Status: DC

## 2024-04-18 MED ORDER — ASPIRIN 81 MG PO TBEC
81.0000 mg | DELAYED_RELEASE_TABLET | Freq: Every day | ORAL | Status: DC
  Administered 2024-04-19 – 2024-04-20 (×2): 81 mg via ORAL
  Filled 2024-04-18 (×2): qty 1

## 2024-04-18 MED ORDER — LIDOCAINE 2% (20 MG/ML) 5 ML SYRINGE
INTRAMUSCULAR | Status: DC | PRN
  Administered 2024-04-18: 80 mg via INTRAVENOUS

## 2024-04-18 MED ORDER — ACETAMINOPHEN 325 MG PO TABS: 325.0000 mg | ORAL_TABLET | ORAL | Status: DC | PRN

## 2024-04-18 MED ORDER — LOSARTAN POTASSIUM 50 MG PO TABS
50.0000 mg | ORAL_TABLET | Freq: Every day | ORAL | Status: DC
  Administered 2024-04-18: 50 mg via ORAL
  Filled 2024-04-18 (×2): qty 1

## 2024-04-18 MED ORDER — HYDROCHLOROTHIAZIDE 12.5 MG PO TABS
12.5000 mg | ORAL_TABLET | Freq: Every day | ORAL | Status: DC
  Administered 2024-04-18: 12.5 mg via ORAL
  Filled 2024-04-18 (×2): qty 1

## 2024-04-18 MED ORDER — INDOMETHACIN 25 MG PO CAPS: 25.0000 mg | ORAL_CAPSULE | Freq: Three times a day (TID) | ORAL | Status: DC | PRN

## 2024-04-18 MED ORDER — FENTANYL CITRATE (PF) 250 MCG/5ML IJ SOLN
INTRAMUSCULAR | Status: DC | PRN
  Administered 2024-04-18: 100 ug via INTRAVENOUS

## 2024-04-18 MED ORDER — CLOPIDOGREL BISULFATE 75 MG PO TABS
75.0000 mg | ORAL_TABLET | Freq: Every day | ORAL | Status: DC
  Administered 2024-04-19 – 2024-04-20 (×2): 75 mg via ORAL
  Filled 2024-04-18 (×2): qty 1

## 2024-04-18 MED ORDER — SUGAMMADEX SODIUM 200 MG/2ML IV SOLN
INTRAVENOUS | Status: DC | PRN
  Administered 2024-04-18: 200 mg via INTRAVENOUS

## 2024-04-18 MED ORDER — OXYCODONE-ACETAMINOPHEN 5-325 MG PO TABS
1.0000 | ORAL_TABLET | ORAL | Status: DC | PRN
  Administered 2024-04-19 (×2): 1 via ORAL
  Filled 2024-04-18 (×2): qty 1

## 2024-04-18 MED ORDER — SODIUM CHLORIDE 0.9 % IV SOLN
500.0000 mL | Freq: Once | INTRAVENOUS | Status: AC | PRN
  Administered 2024-04-18: 500 mL via INTRAVENOUS

## 2024-04-18 MED ORDER — HEPARIN 6000 UNIT IRRIGATION SOLUTION
Status: DC | PRN
  Administered 2024-04-18 (×2): 1

## 2024-04-18 MED ORDER — CEFAZOLIN SODIUM-DEXTROSE 2-4 GM/100ML-% IV SOLN
2.0000 g | INTRAVENOUS | Status: AC
  Administered 2024-04-18: 2 g via INTRAVENOUS
  Filled 2024-04-18: qty 100

## 2024-04-18 MED ORDER — CHLORHEXIDINE GLUCONATE 0.12 % MT SOLN
OROMUCOSAL | Status: AC
  Administered 2024-04-18: 15 mL
  Filled 2024-04-18: qty 15

## 2024-04-18 MED ORDER — POTASSIUM CHLORIDE CRYS ER 20 MEQ PO TBCR: 40.0000 meq | EXTENDED_RELEASE_TABLET | Freq: Every day | ORAL | Status: DC | PRN

## 2024-04-18 MED ORDER — LACTATED RINGERS IV SOLN: INTRAVENOUS | Status: AC

## 2024-04-18 MED ORDER — HEPARIN SODIUM (PORCINE) 1000 UNIT/ML IJ SOLN
INTRAMUSCULAR | Status: AC
  Filled 2024-04-18: qty 20

## 2024-04-18 MED ORDER — PROPOFOL 10 MG/ML IV BOLUS
INTRAVENOUS | Status: DC | PRN
  Administered 2024-04-18: 40 mg via INTRAVENOUS
  Administered 2024-04-18: 150 mg via INTRAVENOUS

## 2024-04-18 MED ORDER — LABETALOL HCL 5 MG/ML IV SOLN: 10.0000 mg | INTRAVENOUS | Status: DC | PRN

## 2024-04-18 MED ORDER — PHENYLEPHRINE 80 MCG/ML (10ML) SYRINGE FOR IV PUSH (FOR BLOOD PRESSURE SUPPORT)
PREFILLED_SYRINGE | INTRAVENOUS | Status: DC | PRN
  Administered 2024-04-18 (×2): 160 ug via INTRAVENOUS

## 2024-04-18 MED ORDER — LORATADINE 10 MG PO TABS
10.0000 mg | ORAL_TABLET | Freq: Every day | ORAL | Status: DC
  Administered 2024-04-18 – 2024-04-20 (×3): 10 mg via ORAL
  Filled 2024-04-18 (×3): qty 1

## 2024-04-18 MED ORDER — CHLORHEXIDINE GLUCONATE 0.12 % MT SOLN
OROMUCOSAL | Status: AC
  Filled 2024-04-18: qty 15

## 2024-04-18 MED ORDER — PHENYLEPHRINE HCL-NACL 20-0.9 MG/250ML-% IV SOLN
INTRAVENOUS | Status: DC | PRN
  Administered 2024-04-18: 20 ug/min via INTRAVENOUS

## 2024-04-18 MED ORDER — PHENOL 1.4 % MT LIQD: 1.0000 | OROMUCOSAL | Status: DC | PRN

## 2024-04-18 MED ORDER — METOPROLOL TARTRATE 5 MG/5ML IV SOLN: 2.5000 mg | INTRAVENOUS | Status: DC | PRN

## 2024-04-18 MED ORDER — ALBUTEROL SULFATE (2.5 MG/3ML) 0.083% IN NEBU: 3.0000 mL | INHALATION_SOLUTION | RESPIRATORY_TRACT | Status: DC | PRN

## 2024-04-18 MED ORDER — ACETAMINOPHEN 650 MG RE SUPP: 325.0000 mg | RECTAL | Status: DC | PRN

## 2024-04-18 MED ORDER — LOSARTAN POTASSIUM-HCTZ 50-12.5 MG PO TABS: 1.0000 | ORAL_TABLET | Freq: Every day | ORAL | Status: DC

## 2024-04-18 MED ORDER — POLYETHYLENE GLYCOL 3350 17 G PO PACK: 17.0000 g | PACK | Freq: Every day | ORAL | Status: DC | PRN

## 2024-04-18 MED ORDER — 0.9 % SODIUM CHLORIDE (POUR BTL) OPTIME
TOPICAL | Status: DC | PRN
  Administered 2024-04-18 (×2): 1000 mL

## 2024-04-18 MED ORDER — MORPHINE SULFATE (PF) 2 MG/ML IV SOLN: 2.0000 mg | INTRAVENOUS | Status: DC | PRN

## 2024-04-18 MED ORDER — PROPOFOL 10 MG/ML IV BOLUS
INTRAVENOUS | Status: AC
  Filled 2024-04-18: qty 20

## 2024-04-18 MED ORDER — BISACODYL 10 MG RE SUPP: 10.0000 mg | Freq: Every day | RECTAL | Status: DC | PRN

## 2024-04-18 MED ORDER — FENTANYL CITRATE (PF) 100 MCG/2ML IJ SOLN
INTRAMUSCULAR | Status: AC
  Filled 2024-04-18: qty 2

## 2024-04-18 MED ORDER — METOPROLOL SUCCINATE ER 25 MG PO TB24
25.0000 mg | ORAL_TABLET | Freq: Every day | ORAL | Status: DC
  Filled 2024-04-18: qty 1

## 2024-04-18 MED ORDER — DROPERIDOL 2.5 MG/ML IJ SOLN: 0.6250 mg | Freq: Once | INTRAMUSCULAR | Status: DC | PRN

## 2024-04-18 MED ORDER — ONDANSETRON HCL 4 MG/2ML IJ SOLN: 4.0000 mg | Freq: Four times a day (QID) | INTRAMUSCULAR | Status: DC | PRN

## 2024-04-18 MED ORDER — ROCURONIUM BROMIDE 10 MG/ML (PF) SYRINGE
PREFILLED_SYRINGE | INTRAVENOUS | Status: DC | PRN
  Administered 2024-04-18 (×3): 10 mg via INTRAVENOUS
  Administered 2024-04-18: 50 mg via INTRAVENOUS

## 2024-04-18 MED ORDER — CEFAZOLIN SODIUM-DEXTROSE 2-4 GM/100ML-% IV SOLN
2.0000 g | Freq: Three times a day (TID) | INTRAVENOUS | Status: AC
  Administered 2024-04-18 (×2): 2 g via INTRAVENOUS
  Filled 2024-04-18 (×2): qty 100

## 2024-04-18 MED ORDER — LIDOCAINE 2% (20 MG/ML) 5 ML SYRINGE
INTRAMUSCULAR | Status: AC
  Filled 2024-04-18: qty 5

## 2024-04-18 MED ORDER — GLYCOPYRROLATE PF 0.2 MG/ML IJ SOSY
PREFILLED_SYRINGE | INTRAMUSCULAR | Status: DC | PRN
  Administered 2024-04-18: .4 mg via INTRAVENOUS

## 2024-04-18 MED ORDER — ALBUMIN HUMAN 5 % IV SOLN
INTRAVENOUS | Status: AC
  Filled 2024-04-18: qty 250

## 2024-04-18 MED ORDER — COLCHICINE 0.6 MG PO TABS: 0.6000 mg | ORAL_TABLET | Freq: Every day | ORAL | Status: DC | PRN

## 2024-04-18 MED ORDER — ROCURONIUM BROMIDE 10 MG/ML (PF) SYRINGE
PREFILLED_SYRINGE | INTRAVENOUS | Status: AC
  Filled 2024-04-18: qty 10

## 2024-04-18 MED ORDER — FENTANYL CITRATE (PF) 100 MCG/2ML IJ SOLN
25.0000 ug | INTRAMUSCULAR | Status: DC | PRN
  Administered 2024-04-18: 25 ug via INTRAVENOUS

## 2024-04-18 SURGICAL SUPPLY — 44 items
BAG COUNTER SPONGE SURGICOUNT (BAG) ×1 IMPLANT
BIOPATCH RED 1 DISK 7.0 (GAUZE/BANDAGES/DRESSINGS) IMPLANT
CANISTER SUCTION 3000ML PPV (SUCTIONS) ×1 IMPLANT
CANNULA VESSEL 3MM 2 BLNT TIP (CANNULA) ×1 IMPLANT
CATH ROBINSON RED A/P 18FR (CATHETERS) ×1 IMPLANT
CLIP TI WIDE RED SMALL 24 (CLIP) ×1 IMPLANT
CLIP TI WIDE RED SMALL 6 (CLIP) ×1 IMPLANT
COVER PROBE W GEL 5X96 (DRAPES) ×1 IMPLANT
COVER TRANSDUCER ULTRASND GEL (DISPOSABLE) ×1 IMPLANT
DERMABOND ADVANCED .7 DNX12 (GAUZE/BANDAGES/DRESSINGS) ×1 IMPLANT
DERMABOND ADVANCED .7 DNX6 (GAUZE/BANDAGES/DRESSINGS) IMPLANT
DRAIN CHANNEL 15F RND FF W/TCR (WOUND CARE) IMPLANT
DRAPE SURG ORHT 6 SPLT 77X108 (DRAPES) IMPLANT
DRSG COVADERM 4X6 (GAUZE/BANDAGES/DRESSINGS) IMPLANT
ELECTRODE REM PT RTRN 9FT ADLT (ELECTROSURGICAL) ×1 IMPLANT
EVACUATOR SILICONE 100CC (DRAIN) IMPLANT
GLOVE BIOGEL PI IND STRL 8 (GLOVE) ×1 IMPLANT
GOWN STRL REUS W/ TWL LRG LVL3 (GOWN DISPOSABLE) ×2 IMPLANT
GOWN STRL REUS W/TWL 2XL LVL3 (GOWN DISPOSABLE) ×2 IMPLANT
HEMOSTAT HEMOBLAST BELLOWS (HEMOSTASIS) IMPLANT
HEMOSTAT SNOW SURGICEL 2X4 (HEMOSTASIS) IMPLANT
KIT BASIN OR (CUSTOM PROCEDURE TRAY) ×1 IMPLANT
KIT SHUNT ARGYLE CAROTID ART 6 (VASCULAR PRODUCTS) IMPLANT
KIT TURNOVER KIT B (KITS) ×1 IMPLANT
NDL HYPO 25GX1X1/2 BEV (NEEDLE) IMPLANT
NEEDLE HYPO 25GX1X1/2 BEV (NEEDLE) ×1 IMPLANT
NS IRRIG 1000ML POUR BTL (IV SOLUTION) ×3 IMPLANT
PACK CAROTID (CUSTOM PROCEDURE TRAY) ×1 IMPLANT
PAD ARMBOARD POSITIONER FOAM (MISCELLANEOUS) ×2 IMPLANT
PATCH VASC XENOSURE 1X6 (Vascular Products) IMPLANT
POSITIONER HEAD DONUT 9IN (MISCELLANEOUS) ×1 IMPLANT
SET WALTER ACTIVATION W/DRAPE (SET/KITS/TRAYS/PACK) ×1 IMPLANT
SUT ETHILON 3 0 PS 1 (SUTURE) IMPLANT
SUT MNCRL AB 4-0 PS2 18 (SUTURE) ×1 IMPLANT
SUT PROLENE 6 0 BV (SUTURE) ×1 IMPLANT
SUT PROLENE 7 0 BV1 MDA (SUTURE) IMPLANT
SUT SILK 3-0 18XBRD TIE 12 (SUTURE) IMPLANT
SUT VIC AB 2-0 CT1 TAPERPNT 27 (SUTURE) ×1 IMPLANT
SUT VIC AB 3-0 SH 27X BRD (SUTURE) ×1 IMPLANT
SYR 20CC LL (SYRINGE) ×2 IMPLANT
SYR CONTROL 10ML LL (SYRINGE) IMPLANT
TOWEL GREEN STERILE (TOWEL DISPOSABLE) ×1 IMPLANT
VASCULAR TIE MINI RED 18IN STL (MISCELLANEOUS) IMPLANT
WATER STERILE IRR 1000ML POUR (IV SOLUTION) ×1 IMPLANT

## 2024-04-18 NOTE — Op Note (Addendum)
 NAME: Miranda [name]    MRN: [id number] DOB: [birth year]    DATE OF OPERATION: 04/18/2024  PREOP DIAGNOSIS:    Symptomatic left internal carotid artery stenosis  POSTOP DIAGNOSIS:    Same  PROCEDURE:    Left carotid endarterectomy with bovine pericardial patch plasty  SURGEON: [name]  ASSIST: Lucie Apt, PA  ANESTHESIA: General  EBL: 75 mL  INDICATIONS:    Miranda [name] is a 73 y.o. female with bilateral symptomatic ICA stenosis.  The left carotid had a high-grade of stenosis, therefore I was asked by neurology to revascularize the left carotid artery prior to the right.  After discussing risks and benefits of left-sided carotid endarterectomy with bovine pericardial patch plasty, care elected to proceed.  FINDINGS:   Severe calcific disease with greater than 95% stenosis  TECHNIQUE:   After full informed written consent was obtained from the patient, the patient was brought back to the operating room and placed supine upon the operating table.  Prior to induction, the patient received IV antibiotics.  After obtaining adequate anesthesia, the patient was placed into semi-Fowler position with a shoulder roll in place and the patient's neck slightly hyperextended and rotated away from the surgical site.  The patient was prepped in the standard fashion for a left carotid endarterectomy.    I made an incision anterior to the sternocleidomastoid muscle and dissected down through the subcutaneous tissue.  The platysmas was opened with electrocautery.  Then I dissected down to the internal jugular vein.  This was dissected posteriorly until I obtained visualization of the common carotid artery.  This was dissected out and then an umbilical tape was placed around the common carotid artery and I loosely applied a Rumel tourniquet.  I then dissected in a periadventitial fashion along the common carotid artery up to the bifurcation.  I then identified the external carotid  artery and the superior thyroid artery.  A 2-0 silk tie was looped around the superior thyroid artery, and I also dissected out the external carotid artery and placed a vessel loop around it.  In continuing the dissection to the internal carotid artery, I identified the facial vein.  This was ligated and then transected, giving me improved exposure of the internal carotid artery.  In the process of this dissection, the hypoglossal nerve was identified.  I then dissected out the internal carotid artery until I identified an area of soft tissue in the internal carotid artery.  I dissected slightly distal to this area, and placed an umbilical tape around the artery and loosely applied a Rumel tourniquet.  At this point, we gave the patient a therapeutic bolus of Heparin  intravenously (roughly 100 units/kg).  After confirming an ACT greater than 250, in this case it was 298, I clamped the internal carotid artery, external carotid artery and then the common carotid artery.  I then made an arteriotomy in the common carotid artery with a 11 blade, and extended the arteriotomy with a Potts scissor down into the common carotid artery, then I carried the arteriotomy through the bifurcation into the internal carotid artery until I reached an area that was not diseased.    At this point, I started the endarterectomy in the common carotid artery with a Cytogeneticist and carried this dissection down into the common carotid artery circumferentially.  Then I transected the plaque at a segment where it was adherent.  I then carried this dissection up into the external carotid artery.  The plaque was extracted by unclamping the external carotid artery and everting the artery.  The dissection was then carried into the internal carotid artery, extracting the remaining portion of the carotid plaque.  I passed the plaque off the field as a specimen.  I then backbled the left internal carotid artery which demonstrated brisk,  pulsatile backbleeding.  I elected not to clamp.  After verifying that there was no more loose intimal flaps or debris, I re-interrogated the entirety of this carotid artery.  At this point, I was satisfied that the minimal remaining disease was densely adherent to the wall and wall integrity was intact.  At this point, I then fashioned a bovine pericardial patch for the geometry of this artery and sewed it in place with two running stitch of 6-0 Prolene, one from each end.  Prior to completing this patch angioplasty, I backbled the internal carotid artery, external carotid artery, common carotid artery.  Copious amounts of heparinized saline were instilled into the patch.  First, I released the clamp on the external carotid artery, then I released it on the common carotid artery.  After waiting a few seconds, I then released it on the internal carotid artery.  I then interrogated this patient's arteries with the continuous Doppler.  The audible waveforms in each artery were consistent with the expected characteristics for each artery.  The Sonosite probe was then sterilely draped and used to interrogate the carotid artery in both longitudinal and transverse views.  There was no debris, no concern for intimal flap.   I washed out the wound, and placed thrombin and Gelfoam throughout.  I also gave the patient 50 mg of protamine  to reverse his anticoagulation.   After waiting a few minutes, I removed the thrombin and Gelfoam and washed out the wound.  Several sutures were placed as there was needle bleeding.  Due to some coagulopathy due to dual antiplatelet therapy, elected to leave a 15 [place] Blake drain.  I then reapproximated the platysma muscle with a running stitch of 3-0 Vicryl.  The skin was then reapproximated with a running subcuticular 4-0 Monocryl stitch.  The skin was then cleaned, dried and Dermabond was used to reinforce the skin closure.  The patient woke without any problems, neurologically  intact.    Given the complexity of the case,  the assistant was necessary in order to expedient the procedure and safely perform the technical aspects of the operation.  The assistant provided traction and countertraction to assist with exposure of the common carotid artery, external carotid artery, and internal carotid artery.  They also assisted with suture ligatures and dividing the facial vein and multiple small venous branches tethering the hypoglossal nerve. The assistant also played a critical role in placing the shunt safely.  In addition they were necessary to provide adequate traction and countertraction to perform a precise endarterectomy and precise closure.  These skills, especially following the Prolene suture for the anastomosis, could not have been adequately performed by a scrub tech assistant.     [name], MD [facility] DATE OF DICTATION:   04/18/2024

## 2024-04-18 NOTE — Anesthesia Procedure Notes (Signed)
 Procedure Name: Intubation Date/Time: 04/18/2024 8:33 AM  Performed by: Glenna Brunkow J, CRNAPre-anesthesia Checklist: Patient identified, Emergency Drugs available, Suction available and Patient being monitored Patient Re-evaluated:Patient Re-evaluated prior to induction Oxygen Delivery Method: Circle System Utilized Preoxygenation: Pre-oxygenation with 100% oxygen Induction Type: IV induction and Rapid sequence Laryngoscope Size: Glidescope and 3 Grade View: Grade I Tube type: Oral Tube size: 7.0 mm Number of attempts: 1 Airway Equipment and Method: Stylet and Oral airway Placement Confirmation: ETT inserted through vocal cords under direct vision, positive ETCO2 and breath sounds checked- equal and bilateral Secured at: 21 cm Tube secured with: Tape Dental Injury: Teeth and Oropharynx as per pre-operative assessment

## 2024-04-18 NOTE — Transfer of Care (Signed)
 Immediate Anesthesia Transfer of Care Note  Patient: Miranda [name]  Procedure(s) Performed: ENDARTERECTOMY, CAROTID (Left: Neck) ANGIOPLASTY, USING PATCH GRAFT (Left: Neck)  Patient Location: PACU  Anesthesia Type:General  Level of Consciousness: awake, alert , and oriented  Airway & Oxygen Therapy: Patient Spontanous Breathing and Patient connected to face mask oxygen  Post-op Assessment: Report given to RN and Post -op Vital signs reviewed and stable  Post vital signs: Reviewed and stable  Last Vitals:  Vitals Value Taken Time  BP 109/54 04/18/24 11:37  Temp    Pulse 59 04/18/24 11:43  Resp 15 04/18/24 11:43  SpO2 96 % 04/18/24 11:43  Vitals shown include unfiled device data.  Last Pain:  Vitals:   04/18/24 0712  TempSrc: (P) Oral         Complications: No notable events documented.

## 2024-04-18 NOTE — Discharge Instructions (Signed)
   [facility]  Discharge Instructions   Carotid Surgery  Please refer to the following instructions for your post-procedure care. Your surgeon or physician assistant will discuss any changes with you.  Activity  You are encouraged to walk as much as you can. You can slowly return to normal activities but must avoid strenuous activity and heavy lifting until your doctor tell you it's okay. Avoid activities such as vacuuming or swinging a golf club. You can drive after one week if you are comfortable and you are no longer taking prescription pain medications. It is normal to feel tired for serval weeks after your surgery. It is also normal to have difficulty with sleep habits, eating, and bowel movements after surgery. These will go away with time.  Bathing/Showering  Shower daily after you go home. Do not soak in a bathtub, hot tub, or swim until the incision heals completely.  Incision Care  Shower every day. Clean your incision with mild soap and water . Pat the area dry with a clean towel. You do not need a bandage unless otherwise instructed. Do not apply any ointments or creams to your incision. You may have skin glue on your incision. Do not peel it off. It will come off on its own in about one week. Your incision may feel thickened and raised for several weeks after your surgery. This is normal and the skin will soften over time.   For Men Only: It's okay to shave around the incision but do not shave the incision itself for 2 weeks. It is common to have numbness under your chin that could last for several months.  Diet  Resume your normal diet. There are no special food restrictions following this procedure. A low fat/low cholesterol diet is recommended for all patients with vascular disease. In order to heal from your surgery, it is CRITICAL to get adequate nutrition. Your body requires vitamins, minerals, and protein. Vegetables are the best source of  vitamins and minerals. Vegetables also provide the perfect balance of protein. Processed food has little nutritional value, so try to avoid this.  Medications  Resume taking all of your medications unless your doctor or physician assistant tells you not to. If your incision is causing pain, you may take over-the- counter pain relievers such as acetaminophen  (Tylenol ). If you were prescribed a stronger pain medication, please be aware these medications can cause nausea and constipation. Prevent nausea by taking the medication with a snack or meal. Avoid constipation by drinking plenty of fluids and eating foods with a high amount of fiber, such as fruits, vegetables, and grains.  Do not take Tylenol  if you are taking prescription pain medications.  Follow Up  Our office will schedule a follow up appointment in 1 week   Please call us  immediately for any of the following conditions  Increased pain, redness, drainage (pus) from your incision site. Fever of 101 degrees or higher. If you should develop stroke (slurred speech, difficulty swallowing, weakness on one side of your body, loss of vision) you should call 911 and go to the nearest emergency room.  Reduce your risk of vascular disease:  Stop smoking. If you would like help call [facility] at 1-800-QUIT-NOW (437-284-9517) or [place] at [phone]. Manage your cholesterol Maintain a desired weight Control your diabetes Keep your blood pressure down  If you have any questions, please call the office at [phone].

## 2024-04-18 NOTE — H&P (Signed)
 Hospital Consult  Patient seen and examined in preop holding.  No complaints. No changes to medication history or physical exam since last seen in clinic. After discussing the risks and benefits of Left carotid endarterectomy, Miranda [name] elected to proceed.   [name] MD   Reason for Consult:  bilateral ICA stenosis Requesting Physician:  Dr. Jonel MRN #:  [id number]  History of Present Illness: This is a 73 y.o. female with pertinent past medical history of CAD s/p CABG, HTN, and HLD who presented yesterday to ER with sudden onset of slurred speech and left facial droop. She says she was Facetiming with her sister when suddenly her mouth twisted to the left and her speech was slurred. She says it lasted just several minutes and resolved. She has never experienced anything like this before. Her sister encouraged her to get evaluated right away. She says she felt find and drove herself to the ER. She denies any visual changes or amaurosis, no upper or lower extremity weakness or numbness. She feels she is at her normal baseline. She does endorse some irregular heart beats/ palpitations. Code stroke was activated on her arrival. Neurology was consulted. MRI shows tiny subacute left subcortical infarct, which is felt to be unrelated to her symptoms.  CT angiogram had suggested 80% calcified proximal left ICA stenosis.  Carotid ultrasound confirms 60-79% right ICA and 80-99% left ICA stenosis. Patient explains that she has known about her carotid stenosis and has previously been followed by her Cardiologist. Vascular surgery was consulted for evaluation of her carotid stenosis.   Past Medical History:  Diagnosis Date   Aortic atherosclerosis (HCC)    Asthma    Carotid artery disease    Carotid US  11/2019: R 1-39; L 60-79   Carotid stenosis    Chronic obstructive pulmonary disease    Coronary artery disease    s/p NSTEMI 11/2019 >> s/p CABG // Echo 2.14.2021: EF 50-55, Gr 1 DD, RVSP  31.9   CVA (cerebral vascular accident) (HCC) 04/09/2024   Diabetes (HCC)    type 2, no meds   Gout    History of blood transfusion 11/2019   History of cigarette smoking    quit   History of pulmonary embolism    Pt notes episode in her 92s // Dx with RLL PE at time of NSTEMI in 11/2019 >> Apixaban    HLD (hyperlipidemia)    Hypertension    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)    NSVT (nonsustained ventricular tachycardia) (HCC)    Obese    Osteoarthritis    Osteochondral lesion of talar dome    Osteopenia    Pneumonia 11/2019   PVC (premature ventricular contraction)    Skin mass    Stage B ACC/AHA asymptomatic heart failure (HCC)    SVT (supraventricular tachycardia) (HCC)    Vitamin D deficiency     Past Surgical History:  Procedure Laterality Date   ABDOMINAL HYSTERECTOMY     CORONARY ARTERY BYPASS GRAFT N/A 11/29/2019   Procedure: CORONARY ARTERY BYPASS GRAFTING (CABG) using the LIMA to LAD; RIMA to PL; Endoscopic harvested right greater saphenous vein: SVC to Diag; SVC to OM1.;  Surgeon: German Bartlett PEDLAR, MD;  Location: [facility] OR;  Service: Open Heart Surgery;  Laterality: N/A;  bilateral IMA   ENDOVEIN HARVEST OF GREATER SAPHENOUS VEIN Right 11/29/2019   Procedure: Jethro Rubins Of Greater Saphenous Vein;  Surgeon: German Bartlett PEDLAR, MD;  Location: [facility] OR;  Service:  Open Heart Surgery;  Laterality: Right;   LEFT HEART CATH AND CORONARY ANGIOGRAPHY N/A 11/27/2019   Procedure: LEFT HEART CATH AND CORONARY ANGIOGRAPHY;  Surgeon: Swaziland, Peter M, MD;  Location: [facility] INVASIVE CV LAB;  Service: Cardiovascular;  Laterality: N/A;   TEE WITHOUT CARDIOVERSION N/A 11/29/2019   Procedure: TRANSESOPHAGEAL ECHOCARDIOGRAM (TEE);  Surgeon: German Bartlett PEDLAR, MD;  Location: [facility] OR;  Service: Open Heart Surgery;  Laterality: N/A;    No Known Allergies  Prior to Admission medications   Medication Sig Start Date End Date Taking? Authorizing Provider  acetaminophen   (TYLENOL ) 500 MG tablet Take 1,000 mg by mouth every 6 (six) hours as needed for moderate pain or headache.   Yes [provider]  albuterol  (VENTOLIN  HFA) 108 (90 Base) MCG/ACT inhaler Inhale 2 puffs into the lungs every 4 (four) hours as needed for wheezing or shortness of breath.  11/03/19  Yes [provider]  colchicine  0.6 MG tablet Take 0.6 mg by mouth daily as needed (gout flares). 06/30/21  Yes [provider]  indomethacin  (INDOCIN ) 25 MG capsule Take 1 capsule (25 mg total) by mouth 3 (three) times daily as needed. 10/17/21  Yes Khatri, Hina, PA-C  losartan -hydrochlorothiazide  (HYZAAR) 50-12.5 MG tablet Take 1 tablet by mouth daily. 08/14/21  Yes [provider]  metoprolol  succinate (TOPROL  XL) 25 MG 24 hr tablet Take 1 tablet (25 mg total) by mouth daily. 02/23/24  Yes Swaziland, Peter M, MD    Social History   Socioeconomic History   Marital status: Single    Spouse name: Not on file   Number of children: Not on file   Years of education: Not on file   Highest education level: Not on file  Occupational History   Not on file  Tobacco Use   Smoking status: Former    Types: Cigarettes   Smokeless tobacco: Never   Tobacco comments:    Quit smoking cigarettes in 2011/2012  Vaping Use   Vaping status: Never Used  Substance and Sexual Activity   Alcohol use: Not Currently    Comment: wine - 1 bottle per week   Drug use: Never   Sexual activity: Not Currently    Birth control/protection: Surgical    Comment: Hysterectomy  Other Topics Concern   Not on file  Social History Narrative   Not on file   Social Drivers of Health   Financial Resource Strain: Not on file  Food Insecurity: No Food Insecurity (04/10/2024)   Hunger Vital Sign    Worried About Running Out of Food in the Last Year: Never true    Ran Out of Food in the Last Year: Never true  Transportation Needs: No Transportation Needs (04/10/2024)   PRAPARE - Scientist, research (physical sciences) (Medical): No    Lack of Transportation (Non-Medical): No  Physical Activity: Not on file  Stress: Not on file  Social Connections: Moderately Integrated (04/10/2024)   Social Connection and Isolation Panel    Frequency of Communication with Friends and Family: Three times a week    Frequency of Social Gatherings with Friends and Family: Three times a week    Attends Religious Services: More than 4 times per year    Active Member of Clubs or Organizations: Yes    Attends Banker Meetings: More than 4 times per year    Marital Status: Never married  Intimate Partner Violence: Not At Risk (04/10/2024)   Humiliation, Afraid, Rape, and Kick questionnaire  Fear of Current or Ex-Partner: No    Emotionally Abused: No    Physically Abused: No    Sexually Abused: No     Family History  Problem Relation Age of Onset   Coronary artery disease Mother     ROS: Otherwise negative unless mentioned in HPI  Physical Examination  There were no vitals filed for this visit.  There is no height or weight on file to calculate BMI.  General:  WDWN in NAD Gait: Not observed HENT: WNL, normocephalic Pulmonary: normal non-labored breathing, without wheezing Cardiac: regular Abdomen:  soft Vascular Exam/Pulses: 2+ radial, 2+ DP bilaterally Extremities: moving all extremities without deficits Musculoskeletal: no muscle wasting or atrophy  Neurologic: A&O X 3;  No focal weakness or paresthesias are detected; speech is fluent/normal Psychiatric:  The pt has Normal affect. Lymph:  Unremarkable  CBC    Component Value Date/Time   WBC 6.2 04/10/2024 0228   RBC 3.66 (L) 04/10/2024 0228   HGB 11.4 (L) 04/10/2024 0228   HCT 34.6 (L) 04/10/2024 0228   PLT 219 04/10/2024 0228   MCV 94.5 04/10/2024 0228   MCV 88.3 09/17/2016 1209   MCH 31.1 04/10/2024 0228   MCHC 32.9 04/10/2024 0228   RDW 12.9 04/10/2024 0228   LYMPHSABS 2.2 04/09/2024 1801   MONOABS 0.9 04/09/2024  1801   EOSABS 0.1 04/09/2024 1801   BASOSABS 0.0 04/09/2024 1801    BMET    Component Value Date/Time   NA 140 04/10/2024 0228   NA 142 02/06/2020 0807   K 3.8 04/10/2024 0228   CL 105 04/10/2024 0228   CO2 23 04/10/2024 0228   GLUCOSE 156 (H) 04/10/2024 0228   BUN 23 04/10/2024 0228   BUN 16 02/06/2020 0807   CREATININE 1.03 (H) 04/10/2024 0228   CREATININE 0.90 07/17/2016 1334   CALCIUM  9.0 04/10/2024 0228   GFRNONAA 58 (L) 04/10/2024 0228   GFRNONAA 66 03/12/2016 1040   GFRAA 77 02/06/2020 0807   GFRAA 76 03/12/2016 1040    COAGS: Lab Results  Component Value Date   INR 1.0 04/09/2024   INR 1.5 (H) 11/29/2019   INR 1.2 11/28/2019     Non-Invasive Vascular Imaging:   VAS US  Carotid duplex: Summary:  Right Carotid: Velocities in the right ICA are consistent with a 60-79% stenosis. The ECA appears >50% stenosed.   Left Carotid: Velocities in the left ICA are consistent with a 80-99% stenosis. The ECA appears >50% stenosed.   Vertebrals:  Bilateral vertebral arteries demonstrate antegrade flow.  Subclavians: Not assessed  CT Angio head neck w wo CM: IMPRESSION: CTA neck:   1. The common carotid and internal carotid arteries are patent in the neck. Atherosclerotic plaque bilaterally as described. Most notably, calcified and noncalcified plaque results in a severe (estimated greater than 80%) stenosis of the distal left common carotid artery. Additionally, there is moderate atherosclerotic plaque within the distal right common carotid artery and about the right carotid bifurcation (with a focus of ulcerated plaque at this site). 2. Vertebral arteries patent within the neck without stenosis. 3. Aortic Atherosclerosis (ICD10-I70.0). 4. Foci of bandlike and nodular pulmonary scarring within the bilateral lungs at the imaged levels. Consider a dedicated chest CT for further evaluation.   CTA head:   1. No proximal intracranial large vessel occlusion  identified. 2. Intracranial atherosclerotic disease most notably as follows. 3. Sites of moderate stenosis within the bilateral intracranial internal carotid arteries. 4. Moderate-to-severe stenosis within the left anterior cerebral artery at  the A2/A3 junction.  MRI small acute or early subacute infarct in left posterior frontal white matter  Statin:  No. Has intolerance Beta Blocker:  Yes.   Aspirin :  Yes.   ACEI:  No. ARB:  Yes.   CCB use:  No Other antiplatelets/anticoagulants:  Yes.   Plavix    ASSESSMENT/PLAN: This is a 73 y.o. female who presented with sudden onset of left sided facial droop and slurred speech. This quickly resolved. She has no other neurological deficits. CTA showing severe left > right ICA calcific stenosis. Carotid duplex confirms 60-79% right ICA and 80-99% left ICA stenosis. MRI small acute or early subacute infarct in left posterior frontal white matter. Suspect she is only symptomatic from her high grade left ICA stenosis. She will need outpatient follow up to continue to monitor the right ICA stenosis. She has been initiated on Asprin and Plavix . She has a statin intolerance. Discussed importance of adequate blood pressure control with her as well. Recommend left TCAR vs Carotid Endarterectomy for stroke risk reduction.  Based on OR availability/ Holiday this week likely will be scheduled for next week. She could be discharged from our standpoint and return next week for her surgery. The on cal surgeon, Dr. Lanis will review her imaging studies and evaluate patient later this afternoon to discuss further surgical planning.   [name] Lanis PA-C [facility] [phone] 04/18/2024  7:08 AM\  VASCULAR STAFF ADDENDUM: I have independently interviewed and examined the patient. I agree with the above.   In short, patient is a 73 year old female from the [facility] area who moved down to [place]  to take care of her mother years ago.   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 She presents after TIA with slurred speech and left facial droop.  MRI showed no right sided infarct, left-sided infarct with bilateral carotid stenosis, left greater than right.  I had a long conversation with Miranda [name] regarding the above.  I also reached out and discussed her case with Dr. Rosemarie.  Being that the left side has a greater percent stenosis, and was also symptomatic with lesion identified in the cortex, he asked that I revascularize the left prior to proceeding with the right.  After discussing the risks and benefits of the above, Miranda [name] elected to proceed. This will be performed in the outpatient setting, likely next Tuesday.  I will have my office reach out to her.  I asked that she continue her current medication regimen.  She does not need to hold dual antiplatelet therapy prior to surgery.  [name] Lanis MD [facility] Phone Number: [phone] 04/18/2024 7:08 AM

## 2024-04-18 NOTE — Anesthesia Procedure Notes (Signed)
 Arterial Line Insertion Start/End7/05/2024 7:30 AM, 04/18/2024 7:40 AM Performed by: Darlyn Rush, MD, Vera Rochele PARAS, CRNA, CRNA  Patient location: Pre-op. Preanesthetic checklist: patient identified, IV checked, site marked, risks and benefits discussed, surgical consent, monitors and equipment checked, pre-op evaluation, timeout performed and anesthesia consent Lidocaine  1% used for infiltration Right, radial was placed Catheter size: 20 G Hand hygiene performed  and maximum sterile barriers used   Attempts: 1 Procedure performed without using ultrasound guided technique. Following insertion, dressing applied. Post procedure assessment: normal and unchanged

## 2024-04-18 NOTE — Plan of Care (Signed)

## 2024-04-18 NOTE — Progress Notes (Signed)
  Progress Note    04/18/2024 12:56 PM * Day of Surgery *  Subjective:  resting comfortably in PACU   Vitals:   04/18/24 1200 04/18/24 1215  BP: (!) 100/41 (!) 96/50  Pulse: (!) 47 (!) 47  Resp: 13 18  Temp:    SpO2: 93% 94%   Physical Exam: Cardiac:  regular Lungs:  non labored Incisions:  left neck incision is clean, dry and intact. No swelling or hematoma. JP with minimal output Extremities:  moving all extremities without deficits Neurologic: alert and oriented. Speech coherent. Tongue midline. Smile symmetric  CBC    Component Value Date/Time   WBC 6.2 04/10/2024 0228   RBC 3.66 (L) 04/10/2024 0228   HGB 11.4 (L) 04/10/2024 0228   HCT 34.6 (L) 04/10/2024 0228   PLT 219 04/10/2024 0228   MCV 94.5 04/10/2024 0228   MCV 88.3 09/17/2016 1209   MCH 31.1 04/10/2024 0228   MCHC 32.9 04/10/2024 0228   RDW 12.9 04/10/2024 0228   LYMPHSABS 2.2 04/09/2024 1801   MONOABS 0.9 04/09/2024 1801   EOSABS 0.1 04/09/2024 1801   BASOSABS 0.0 04/09/2024 1801    BMET    Component Value Date/Time   NA 140 04/10/2024 0228   NA 142 02/06/2020 0807   K 3.8 04/10/2024 0228   CL 105 04/10/2024 0228   CO2 23 04/10/2024 0228   GLUCOSE 156 (H) 04/10/2024 0228   BUN 23 04/10/2024 0228   BUN 16 02/06/2020 0807   CREATININE 1.03 (H) 04/10/2024 0228   CREATININE 0.90 07/17/2016 1334   CALCIUM  9.0 04/10/2024 0228   GFRNONAA 58 (L) 04/10/2024 0228   GFRNONAA 66 03/12/2016 1040   GFRAA 77 02/06/2020 0807   GFRAA 76 03/12/2016 1040    INR    Component Value Date/Time   INR 1.0 04/09/2024 1801     Intake/Output Summary (Last 24 hours) at 04/18/2024 1256 Last data filed at 04/18/2024 1142 Gross per 24 hour  Intake 1200 ml  Output 20 ml  Net 1180 ml     Assessment/Plan:  73 y.o. female is s/p left CEA * Day of Surgery *   Neurologically intact Left neck incision c/d/I without swelling or hematoma Minimal output in JP BP soft. Giving Albumin  and IV NS Does not need any  pressor support at this time If she remains stable plan is to go to 4E when bed available  Teretha Damme, PA-C [facility] [phone] 04/18/2024 12:56 PM

## 2024-04-18 NOTE — Progress Notes (Signed)
 No additional labs needed per Dr. Lanis

## 2024-04-19 ENCOUNTER — Encounter (HOSPITAL_COMMUNITY): Payer: Self-pay | Admitting: Vascular Surgery

## 2024-04-19 ENCOUNTER — Telehealth: Payer: Self-pay

## 2024-04-19 ENCOUNTER — Other Ambulatory Visit (HOSPITAL_COMMUNITY): Payer: Self-pay

## 2024-04-19 DIAGNOSIS — Z48812 Encounter for surgical aftercare following surgery on the circulatory system: Secondary | ICD-10-CM

## 2024-04-19 LAB — POCT ACTIVATED CLOTTING TIME
Activated Clotting Time: 250 s
Activated Clotting Time: 297 s
Activated Clotting Time: 158 s

## 2024-04-19 LAB — BASIC METABOLIC PANEL WITH GFR
Anion gap: 6 (ref 5–15)
BUN: 20 mg/dL (ref 8–23)
CO2: 21 mmol/L — ABNORMAL LOW (ref 22–32)
Calcium: 8.2 mg/dL — ABNORMAL LOW (ref 8.9–10.3)
Chloride: 107 mmol/L (ref 98–111)
Creatinine, Ser: 1.07 mg/dL — ABNORMAL HIGH (ref 0.44–1.00)
GFR, Estimated: 55 mL/min — ABNORMAL LOW (ref >60)
Glucose, Bld: 123 mg/dL — ABNORMAL HIGH (ref 70–99)
Potassium: 3.7 mmol/L (ref 3.5–5.1)
Sodium: 134 mmol/L — ABNORMAL LOW (ref 135–145)

## 2024-04-19 LAB — CBC
HCT: 29.6 % — ABNORMAL LOW (ref 36.0–46.0)
Hemoglobin: 9.5 g/dL — ABNORMAL LOW (ref 12.0–15.0)
MCH: 30.2 pg (ref 26.0–34.0)
MCHC: 32.1 g/dL (ref 30.0–36.0)
MCV: 94 fL (ref 80.0–100.0)
Platelets: 209 K/uL (ref 150–400)
RBC: 3.15 MIL/uL — ABNORMAL LOW (ref 3.87–5.11)
RDW: 12.5 % (ref 11.5–15.5)
WBC: 9.9 K/uL (ref 4.0–10.5)
nRBC: 0 % (ref 0.0–0.2)

## 2024-04-19 MED ORDER — PSEUDOEPHEDRINE HCL 30 MG PO TABS
30.0000 mg | ORAL_TABLET | Freq: Four times a day (QID) | ORAL | Status: DC
  Administered 2024-04-19: 30 mg via ORAL
  Filled 2024-04-19 (×4): qty 1

## 2024-04-19 MED ORDER — PSEUDOEPHEDRINE HCL 30 MG PO TABS
60.0000 mg | ORAL_TABLET | Freq: Four times a day (QID) | ORAL | Status: DC
  Administered 2024-04-19 – 2024-04-20 (×2): 60 mg via ORAL
  Filled 2024-04-19: qty 1
  Filled 2024-04-19: qty 2
  Filled 2024-04-19: qty 1
  Filled 2024-04-19 (×2): qty 2

## 2024-04-19 NOTE — Plan of Care (Signed)
  Problem: Education: Goal: Knowledge of General Education information will improve Description: Including pain rating scale, medication(s)/side effects and non-pharmacologic comfort measures Outcome: Progressing   Problem: Clinical Measurements: Goal: Ability to maintain clinical measurements within normal limits will improve Outcome: Progressing Goal: Will remain free from infection Outcome: Progressing Goal: Cardiovascular complication will be avoided Outcome: Progressing   Problem: Coping: Goal: Level of anxiety will decrease Outcome: Progressing   Problem: Pain Managment: Goal: General experience of comfort will improve and/or be controlled Outcome: Progressing

## 2024-04-19 NOTE — Telephone Encounter (Addendum)
 Dr. Rosemarie and Augustin, patient will be scheduled as soon as possible.  Auth Submission: NO AUTH NEEDED Site of care: Site of care: [facility] Payer: [employer] Medication & CPT/J Code(s) submitted: Leqvio (Inclisiran) [id number] Diagnosis Code:  Route of submission (phone, fax, portal): phone Phone # [phone] Fax # Auth type: Buy/Bill PB Units/visits requested: 284mg  x 2 doses Reference number:  Approval from: 04/19/24 to 10/11/24  I submitted this PA to [facility] on [employer] and received a notice that it had to go through [employer]. I called [employer] and they stated that it does not require a prior auth. I have attached the no PA letter from [employer] to the media tab.

## 2024-04-19 NOTE — Progress Notes (Addendum)
 Progress Note    04/19/2024 8:34 AM 1 Day Post-Op  Subjective:  no complaints    Vitals:   04/19/24 0338 04/19/24 0820  BP: (!) 101/46 (!) 97/48  Pulse: (!) 43 (!) 46  Resp: 18 18  Temp: 98 F (36.7 C) 97.9 F (36.6 C)  SpO2: 100% 100%    Physical Exam: General:  sitting up in bed, NAD Cardiac:  bradycardic, HR in 40s. SBPs soft in 90s-100 Lungs:  nonlabored Incisions:  left sided neck incision intact and soft without hematoma. JP drain w/20 cc output Extremities:  moving all extremities equally Neuro: no slurred speech, tongue deviation, or weakness/numbness   CBC    Component Value Date/Time   WBC 9.9 04/19/2024 0431   RBC 3.15 (L) 04/19/2024 0431   HGB 9.5 (L) 04/19/2024 0431   HCT 29.6 (L) 04/19/2024 0431   PLT 209 04/19/2024 0431   MCV 94.0 04/19/2024 0431   MCV 88.3 09/17/2016 1209   MCH 30.2 04/19/2024 0431   MCHC 32.1 04/19/2024 0431   RDW 12.5 04/19/2024 0431   LYMPHSABS 2.2 04/09/2024 1801   MONOABS 0.9 04/09/2024 1801   EOSABS 0.1 04/09/2024 1801   BASOSABS 0.0 04/09/2024 1801    BMET    Component Value Date/Time   NA 134 (L) 04/19/2024 0431   NA 142 02/06/2020 0807   K 3.7 04/19/2024 0431   CL 107 04/19/2024 0431   CO2 21 (L) 04/19/2024 0431   GLUCOSE 123 (H) 04/19/2024 0431   BUN 20 04/19/2024 0431   BUN 16 02/06/2020 0807   CREATININE 1.07 (H) 04/19/2024 0431   CREATININE 0.90 07/17/2016 1334   CALCIUM  8.2 (L) 04/19/2024 0431   GFRNONAA 55 (L) 04/19/2024 0431   GFRNONAA 66 03/12/2016 1040   GFRAA 77 02/06/2020 0807   GFRAA 76 03/12/2016 1040    INR    Component Value Date/Time   INR 1.0 04/09/2024 1801     Intake/Output Summary (Last 24 hours) at 04/19/2024 0834 Last data filed at 04/19/2024 0827 Gross per 24 hour  Intake 1758.41 ml  Output 50 ml  Net 1708.41 ml      Assessment/Plan:  73 y.o. female is 1 day post op, s/p: left CEA   -She is doing well this morning without any complaints -L sided neck incision intact  and soft without hematoma. JP drain with 20cc output. Will remove later this morning -She remains neurologically intact on exam. She denies any stroke like symptoms -She tolerated a normal diet last night without any swallowing difficulties. She has also gotten out of bed to go to the bathroom without any issue -Hemoglobin with slight drop from 11.4 to 9.5, likely due to intra-op blood loss. No signs of further blood loss. No indications for transfusion -Scr at baseline at 1.07 with good UO -Blood pressures have been soft post op in the 90s-100s systolic. Pre-op SBPs in the 130s-140s. Also bradycardic since surgery with HR in 40s. Normal resting HR in 60s-70s. She is asymptomatic. Will get her up and moving today to see how she does. Will also hold on BP meds -Continue asa and plavix  -May need to monitor another day to ensure BP and HR improve. Will reassess later this afternoon  [name], [place] [facility] [phone] 04/19/2024 8:34 AM  VASCULAR STAFF ADDENDUM: I have independently interviewed and examined the patient. I agree with the above.  Overall doing well Low blood pressure, low heart rate likely due to manipulation of left-sided carotid body.  This is not abnormal. No deficits Will start pseudoephedrine , plan to hold 1 more day. Drain pulled  [name] MD [facility] Phone Number: [phone] 04/19/2024 3:05 PM

## 2024-04-19 NOTE — Anesthesia Postprocedure Evaluation (Signed)
 Anesthesia Post Note  Patient: Miranda [name]  Procedure(s) Performed: ENDARTERECTOMY, CAROTID (Left: Neck) ANGIOPLASTY, USING PATCH GRAFT (Left: Neck)     Patient location during evaluation: PACU Anesthesia Type: General Level of consciousness: sedated and patient cooperative Pain management: pain level controlled Vital Signs Assessment: post-procedure vital signs reviewed and stable Respiratory status: spontaneous breathing Cardiovascular status: stable Anesthetic complications: no   No notable events documented.  Last Vitals:  Vitals:   04/19/24 1807 04/19/24 1900  BP: (!) 107/53 (!) 113/48  Pulse: (!) 43 (!) 49  Resp: 17 (!) 21  Temp: 36.5 C (!) 36.4 C  SpO2: 99% 100%    Last Pain:  Vitals:   04/19/24 2105  TempSrc:   PainSc: 4                  Norleen Pope

## 2024-04-19 NOTE — Progress Notes (Signed)
 Patients sister requested to have RN take BP manual. BP 108/46.   Birgit Nowling L Zelena Bushong, RN

## 2024-04-20 ENCOUNTER — Other Ambulatory Visit (HOSPITAL_COMMUNITY): Payer: Self-pay

## 2024-04-20 LAB — BASIC METABOLIC PANEL WITH GFR
Anion gap: 10 (ref 5–15)
BUN: 22 mg/dL (ref 8–23)
CO2: 22 mmol/L (ref 22–32)
Calcium: 9.2 mg/dL (ref 8.9–10.3)
Chloride: 107 mmol/L (ref 98–111)
Creatinine, Ser: 1.25 mg/dL — ABNORMAL HIGH (ref 0.44–1.00)
GFR, Estimated: 46 mL/min — ABNORMAL LOW (ref >60)
Glucose, Bld: 123 mg/dL — ABNORMAL HIGH (ref 70–99)
Potassium: 3.9 mmol/L (ref 3.5–5.1)
Sodium: 139 mmol/L (ref 135–145)

## 2024-04-20 MED ORDER — PSEUDOEPHEDRINE HCL 30 MG PO TABS
30.0000 mg | ORAL_TABLET | Freq: Four times a day (QID) | ORAL | 0 refills | Status: AC | PRN
  Filled 2024-04-20: qty 20, 5d supply, fill #0

## 2024-04-20 MED ORDER — PSEUDOEPHEDRINE HCL 30 MG PO TABS
30.0000 mg | ORAL_TABLET | Freq: Four times a day (QID) | ORAL | Status: DC
  Filled 2024-04-20 (×2): qty 1

## 2024-04-20 MED ORDER — OXYCODONE-ACETAMINOPHEN 5-325 MG PO TABS
1.0000 | ORAL_TABLET | Freq: Four times a day (QID) | ORAL | 0 refills | Status: AC | PRN
  Filled 2024-04-20: qty 15, 4d supply, fill #0

## 2024-04-20 NOTE — Plan of Care (Signed)
  Problem: Education: Goal: Knowledge of General Education information will improve Description: Including pain rating scale, medication(s)/side effects and non-pharmacologic comfort measures Outcome: Progressing   Problem: Clinical Measurements: Goal: Will remain free from infection Outcome: Progressing   Problem: Pain Managment: Goal: General experience of comfort will improve and/or be controlled Outcome: Progressing   Problem: Safety: Goal: Ability to remain free from injury will improve Outcome: Progressing

## 2024-04-20 NOTE — Plan of Care (Signed)
  Problem: Education: Goal: Knowledge of General Education information will improve Description: Including pain rating scale, medication(s)/side effects and non-pharmacologic comfort measures 04/20/2024 0705 by Dorrine Shasta SAUNDERS, RN Outcome: Progressing 04/20/2024 0705 by Dorrine Shasta SAUNDERS, RN Outcome: Progressing   Problem: Clinical Measurements: Goal: Ability to maintain clinical measurements within normal limits will improve Outcome: Progressing Goal: Will remain free from infection Outcome: Progressing   Problem: Pain Managment: Goal: General experience of comfort will improve and/or be controlled Outcome: Progressing   Problem: Safety: Goal: Ability to remain free from injury will improve Outcome: Progressing

## 2024-04-20 NOTE — Progress Notes (Addendum)
 Progress Note    04/20/2024 8:18 AM 2 Days Post-Op  Subjective:  she says she feels fine. She has gotten out of bed several times without any dizziness or lightheadedness. She endorses minimal soreness at her incision site    Vitals:   04/20/24 0300 04/20/24 0802  BP: (!) 101/43 (!) 130/48  Pulse: 63 (!) 50  Resp: (!) 21 20  Temp: (!) 97.5 F (36.4 C) 98.2 F (36.8 C)  SpO2: 100% 98%    Physical Exam: General:  sitting up in bed, NAD Cardiac:  HR in 50s-60s, SBPs this morning 100-130 Lungs:  nonlabored Incisions:  left sided neck incision c/d/l without hematoma Neuro: moving all extremities equally, no slurred speech, no facial droop   CBC    Component Value Date/Time   WBC 9.9 04/19/2024 0431   RBC 3.15 (L) 04/19/2024 0431   HGB 9.5 (L) 04/19/2024 0431   HCT 29.6 (L) 04/19/2024 0431   PLT 209 04/19/2024 0431   MCV 94.0 04/19/2024 0431   MCV 88.3 09/17/2016 1209   MCH 30.2 04/19/2024 0431   MCHC 32.1 04/19/2024 0431   RDW 12.5 04/19/2024 0431   LYMPHSABS 2.2 04/09/2024 1801   MONOABS 0.9 04/09/2024 1801   EOSABS 0.1 04/09/2024 1801   BASOSABS 0.0 04/09/2024 1801    BMET    Component Value Date/Time   NA 139 04/20/2024 0328   NA 142 02/06/2020 0807   K 3.9 04/20/2024 0328   CL 107 04/20/2024 0328   CO2 22 04/20/2024 0328   GLUCOSE 123 (H) 04/20/2024 0328   BUN 22 04/20/2024 0328   BUN 16 02/06/2020 0807   CREATININE 1.25 (H) 04/20/2024 0328   CREATININE 0.90 07/17/2016 1334   CALCIUM  9.2 04/20/2024 0328   GFRNONAA 46 (L) 04/20/2024 0328   GFRNONAA 66 03/12/2016 1040   GFRAA 77 02/06/2020 0807   GFRAA 76 03/12/2016 1040    INR    Component Value Date/Time   INR 1.0 04/09/2024 1801     Intake/Output Summary (Last 24 hours) at 04/20/2024 0818 Last data filed at 04/19/2024 1847 Gross per 24 hour  Intake 720 ml  Output --  Net 720 ml      Assessment/Plan:  73 y.o. female is 2 days postop, s/p: Left CEA   - She continues to do well this  morning without any complaints.  She denies any dizziness or lightheadedness -Left-sided neck incision is well-appearing without hematoma.  JP drain exit site is dry and bandaged -She is neurologically intact without any strokelike symptoms -She was started on pseudoephedrine  yesterday 30 mg every 6 hours.  In the evening I increased her dose to 60 mg.  Her heart rates have improved this morning between the 50s and 60s.  Overnight she did have some systolic blood pressures in the high 80s and 90s.  Most of her systolic pressures have continued to be around 100-163mmhg. we have held her home blood pressure medications -She has been moving around without any issue.  She is also tolerating a normal diet -Given that she has been stable and asymptomatic for 2 days, I believe she is safe for discharge today.  I have discussed this case with Dr. Lanis.  We have instructed the patient to continue to hold her blood pressure medications when she goes home.  She will also be sent home on Sudafed to maintain her pressures. -Will arrange close follow-up with the patient in 1 week to make sure that her blood pressures are doing okay.  I have also instructed the patient to arrange a 1 week follow-up with her PCP to discuss her blood pressure medications   [name], [place] [facility] [phone] 04/20/2024 8:18 AM  VASCULAR STAFF ADDENDUM: I have independently interviewed and examined the patient. I agree with the above.  Patient was seen and examined this afternoon.  She was doing well. Normotensive, normal heart rate. She is getting up and move around without any issue. Plan is to continue the low-dose Sudafed, we discussed the importance of taking her blood pressure at home.  We discussed that if her blood pressures are over 150, I would like for her to stop the medication, and continue taking her blood pressure with plans to restart home medications on Monday after discussing things  with her primary care.  I have send schedule follow-up with us  as well next week to ensure she is doing well.  [name] MD [facility] Phone Number: [phone] 04/20/2024 2:50 PM

## 2024-04-27 ENCOUNTER — Ambulatory Visit: Attending: Vascular Surgery | Admitting: Physician Assistant

## 2024-04-27 VITALS — BP 170/76 | HR 55 | Temp 98.7°F | Wt 196.2 lb

## 2024-04-27 DIAGNOSIS — I6522 Occlusion and stenosis of left carotid artery: Secondary | ICD-10-CM

## 2024-04-27 DIAGNOSIS — I6521 Occlusion and stenosis of right carotid artery: Secondary | ICD-10-CM

## 2024-04-27 NOTE — Progress Notes (Signed)
 POST OPERATIVE OFFICE NOTE    CC:  F/u for surgery  HPI:  This is a 73 y.o. female who is s/p left carotid endarterectomy with bovine pericardial patch angioplasty on 04/18/24 by Dr. Lanis. This was performed for symptomatic ICA stenosis. Post operatively she remained neurologically intact however she did have some hypotension as well as bradycardia requiring administration of pseudoephedrine . She was kept an extra day for observation. She remained stable for discharge POD#2. She was discharged on low dose Sudafed and given BP parameters to follow for restarting her home medications.  Pt returns today for short interval follow up.  Pt states she is doing well. She has no concerns today. She denies any visual changes, slurred speech, trouble swallowing, unilateral upper or lower extremity weakness or numbness. She does have a little numbness along her incision line otherwise minimal pain. She has been monitoring her blood pressures at home and reports that her SBP has been in the 120s-130s. At her PCP follow up yesterday and visit today her SBP has been in the 160-170's. Her PCP advised her to stop her ARB but discussed restarting her metoprolol  if her pressures remain elevated. She has been off the Sudafed now for a couple days. She is medically managed on Aspirin  and Plavix .   Allergies  Allergen Reactions   Ezetimibe      previous intolerance to multiple statins (rosuvastatin , atorvastatin ) and ezetimibe  per discharge summary note 04/10/2024   Statins      previous intolerance to multiple statins (rosuvastatin , atorvastatin ) and ezetimibe.     Current Outpatient Medications  Medication Sig Dispense Refill   albuterol  (VENTOLIN  HFA) 108 (90 Base) MCG/ACT inhaler Inhale 2 puffs into the lungs every 4 (four) hours as needed for wheezing or shortness of breath.      aspirin  EC 81 MG tablet Take 1 tablet (81 mg total) by mouth daily. Swallow whole. 30 tablet 12   cetirizine (ZYRTEC) 10 MG tablet  Take 10 mg by mouth daily as needed for allergies.     clopidogrel  (PLAVIX ) 75 MG tablet Take 1 tablet (75 mg total) by mouth daily. 21 tablet 0   colchicine  0.6 MG tablet Take 0.6 mg by mouth daily as needed (gout flares).     indomethacin  (INDOCIN ) 25 MG capsule Take 1 capsule (25 mg total) by mouth 3 (three) times daily as needed. 20 capsule 0   oxyCODONE -acetaminophen  (PERCOCET/ROXICET) 5-325 MG tablet Take 1 tablet by mouth every 6 (six) hours as needed for moderate pain (pain score 4-6). 15 tablet 0   No current facility-administered medications for this visit.     ROS:  See HPI  Physical Exam:  Vitals:   04/27/24 0955 04/27/24 0958  BP: (!) 161/80 (!) 170/76  Pulse: (!) 55   Temp: 98.7 F (37.1 C)     General: well appearing, well nourished, in no distress Cardiac: regular, no carotid bruits Lungs: non labored Incision:  left neck incision is clean, dry and intact without swelling or hematoma Extremities: moving all extremities without deficits, 2 + radial and DP pulses bilaterally Neuro: alert and oriented. Speech coherent   Assessment/Plan:  This is a 73 y.o. female who is s/p:left CEA on 04/18/24 by Dr. Lanis for symptomatic stenosis. She is doing very well post operatively. Her incision is intact and healing well. Neurologically she is intact. Her blood pressure has returned to her baseline. She will follow up with PCP regarding resuming her BB for management. At this time she is  just checking her BP regularly at home. She has known right ICA stenosis as well on prior CT and Duplex. Will arrange earlier follow up to monitor this.  - Per Neurology recommendations she will need Plavix  for 3 weeks and then transition to Aspirin  alone. She does not tolerate statins - She will follow up with Neurology outpatient - She will return in 6 months with carotid duplex. She knows to call for earlier follow up should she have any new or concerning symptoms   Curry Damme,  [facility] [phone]   Clinic MD:  Lanis

## 2024-04-27 NOTE — Discharge Summary (Signed)
 Discharge Summary     Miranda [name] Dec 29, 1950 73 y.o. female  [id number]  Admission Date: 04/18/2024  Discharge Date: 04/20/2024  Physician: Fonda Rim MD  Admission Diagnosis: Symptomatic carotid artery stenosis, left [I65.22] Carotid stenosis [I65.29]  Discharge Day Diagnosis: Symptomatic carotid artery stenosis, left [I65.22] Carotid stenosis [facility]  Hospital Course:  The patient was admitted to the hospital and taken to the operating room on 04/18/2024 and underwent left carotid endarterectomy. The pt tolerated the procedure well and was transported to the PACU in stable condition. In PACU her systolic blood pressures were soft in the 90s however she was asymptomatic. She received NS and albumin . She was transferred to the floor later that afternoon.  By POD 1, the pt neuro status was intact. Her left sided neck incision was intact without hematoma. JP drain from the incision site had 20cc output and was removed. She had no hoarseness or swallowing difficulties. She was tolerating a normal diet. She was mobilizing back to her baseline without any dizziness. Her systolic blood pressures remained soft in the 90s-110s. She was also bradycardic with HR in the 40s. This was felt to be due to carotid body irritation during surgery. She remained asymptomatic. She was started on Pseudoephedrine  30mg  q6h. We also held all of her home blood pressure medications.   On POD2 the patient continued to do well. Her systolic blood pressures were between 100-133mmhg on pseudoephedrine . Her HR also improved into the 60s. She continued to have no stroke like symptoms or dizziness.   She was stable for discharge home on POD2. We instructed her to continue to hold her blood pressure medications until she follows up with her PCP. She was also discharged home on Pseudoephedrine  30mg  q6h for one week.   We arranged for a close follow up with our office in 1 week to ensure her blood pressure and  heart rate remained stable.     No results for input(s): NA, K, CL, CO2, GLUCOSE, BUN, CALCIUM  in the last 72 hours.  Invalid input(s): CRATININE No results for input(s): WBC, HGB, HCT, PLT in the last 72 hours. No results for input(s): INR in the last 72 hours.   Discharge Instructions     Call MD for:  persistant dizziness or light-headedness   Complete by: As directed    Call MD for:  redness, tenderness, or signs of infection (pain, swelling, redness, odor or green/yellow discharge around incision site)   Complete by: As directed    Call MD for:  severe uncontrolled pain   Complete by: As directed    Call MD for:  temperature >100.4   Complete by: As directed    Diet - low sodium heart healthy   Complete by: As directed    Discharge wound care:   Complete by: As directed    Wash your incision daily with antibacterial soap and water  then pat dry   Increase activity slowly   Complete by: As directed        Discharge Diagnosis:  Symptomatic carotid artery stenosis, left [I65.22] Carotid stenosis [I65.29]  Secondary Diagnosis: Patient Active Problem List   Diagnosis Date Noted   Carotid stenosis 04/18/2024   Symptomatic carotid artery stenosis, left 04/18/2024   History of pulmonary embolism 04/10/2024   Acute CVA (cerebrovascular accident) (HCC) 04/09/2024   ARF (acute renal failure) (HCC) 04/09/2024   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   NSTEMI (non-ST elevated myocardial infarction) (HCC) 11/26/2019   Multifocal pneumonia 11/26/2019   Hyperlipidemia  11/26/2019   Anemia 11/26/2019   COPD exacerbation (HCC) 08/02/2017   History of gout 09/18/2016   Hypertension 03/12/2016   Past Medical History:  Diagnosis Date   Aortic atherosclerosis (HCC)    Asthma    Carotid artery disease    Carotid US  11/2019: R 1-39; L 60-79   Carotid stenosis    Chronic obstructive pulmonary disease    Coronary artery disease    s/p NSTEMI 11/2019 >> s/p CABG // Echo  2.14.2021: EF 50-55, Gr 1 DD, RVSP 31.9   CVA (cerebral vascular accident) (HCC) 04/09/2024   Diabetes (HCC)    type 2, no meds   Gout    History of blood transfusion 11/2019   History of cigarette smoking    quit   History of pulmonary embolism    Pt notes episode in her 69s // Dx with RLL PE at time of NSTEMI in 11/2019 >> Apixaban    HLD (hyperlipidemia)    Hypertension    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)    NSVT (nonsustained ventricular tachycardia) (HCC)    Obese    Osteoarthritis    Osteochondral lesion of talar dome    Osteopenia    Pneumonia 11/2019   PVC (premature ventricular contraction)    Skin mass    Stage B ACC/AHA asymptomatic heart failure (HCC)    SVT (supraventricular tachycardia) (HCC)    Vitamin D deficiency     Allergies as of 04/20/2024       Reactions   Ezetimibe     previous intolerance to multiple statins (rosuvastatin , atorvastatin ) and ezetimibe  per discharge summary note 04/10/2024   Statins     previous intolerance to multiple statins (rosuvastatin , atorvastatin ) and ezetimibe.         Medication List     STOP taking these medications    losartan -hydrochlorothiazide  50-12.5 MG tablet Commonly known as: HYZAAR   metoprolol  succinate 25 MG 24 hr tablet Commonly known as: Toprol  XL       TAKE these medications    albuterol  108 (90 Base) MCG/ACT inhaler Commonly known as: VENTOLIN  HFA Inhale 2 puffs into the lungs every 4 (four) hours as needed for wheezing or shortness of breath.   aspirin  EC 81 MG tablet Take 1 tablet (81 mg total) by mouth daily. Swallow whole.   cetirizine 10 MG tablet Commonly known as: ZYRTEC Take 10 mg by mouth daily as needed for allergies.   clopidogrel  75 MG tablet Commonly known as: PLAVIX  Take 1 tablet (75 mg total) by mouth daily.   colchicine  0.6 MG tablet Take 0.6 mg by mouth daily as needed (gout flares).   indomethacin  25 MG capsule Commonly known as:  INDOCIN  Take 1 capsule (25 mg total) by mouth 3 (three) times daily as needed.   oxyCODONE -acetaminophen  5-325 MG tablet Commonly known as: PERCOCET/ROXICET Take 1 tablet by mouth every 6 (six) hours as needed for moderate pain (pain score 4-6).       ASK your doctor about these medications    pseudoephedrine  30 MG tablet Commonly known as: SUDAFED Take 1 tablet (30 mg total) by mouth every 6 (six) hours as needed for up to 5 days for congestion. Ask about: Should I take this medication?               Discharge Care Instructions  (From admission, onward)           Start     Ordered   04/20/24 0000  Discharge wound care:       Comments: Wash your incision daily with antibacterial soap and water  then pat dry   04/20/24 1347             Discharge Instructions:   [facility] Discharge Instructions Carotid Revascularization  Please refer to the following instructions for your post-procedure care. Your surgeon or physician assistant will discuss any changes with you.  Activity  You are encouraged to walk as much as you can. You can slowly return to normal activities but must avoid strenuous activity and heavy lifting until your doctor tell you it's OK. Avoid activities such as vacuuming or swinging a golf club. You can drive after one week if you are comfortable and you are no longer taking prescription pain medications. It is normal to feel tired for serval weeks after your surgery. It is also normal to have difficulty with sleep habits, eating, and bowel movements after surgery. These will go away with time.  Bathing/Showering  You may shower after you come home. Do not soak in a bathtub, hot tub, or swim until the incision heals completely.  Incision Care  Shower every day. Clean your incision with mild soap and water . Pat the area dry with a clean towel. You do not need a bandage unless otherwise instructed. Do not apply any  ointments or creams to your incision. You may have skin glue on your incision. Do not peel it off. It will come off on its own in about one week. Your incision may feel thickened and raised for several weeks after your surgery. This is normal and the skin will soften over time. For Men Only: It's OK to shave around the incision but do not shave the incision itself for 2 weeks. It is common to have numbness under your chin that could last for several months.  Diet  Resume your normal diet. There are no special food restrictions following this procedure. A low fat/low cholesterol diet is recommended for all patients with vascular disease. In order to heal from your surgery, it is CRITICAL to get adequate nutrition. Your body requires vitamins, minerals, and protein. Vegetables are the best source of vitamins and minerals. Vegetables also provide the perfect balance of protein. Processed food has little nutritional value, so try to avoid this.  Medications  Resume taking all of your medications unless your doctor or physician assistant tells you not to.  If your incision is causing pain, you may take over-the- counter pain relievers such as acetaminophen  (Tylenol ). If you were prescribed a stronger pain medication, please be aware these medications can cause nausea and constipation.  Prevent nausea by taking the medication with a snack or meal. Avoid constipation by drinking plenty of fluids and eating foods with a high amount of fiber, such as fruits, vegetables, and grains. Do not take Tylenol  if you are taking prescription pain medications.  Follow Up  Our office will schedule a follow up appointment 2-3 weeks following discharge.  Please call us  immediately for any of the following conditions  Increased pain, redness, drainage (pus) from your incision site. Fever of 101 degrees or higher. If you should develop stroke (slurred speech, difficulty swallowing, weakness on one side of your body, loss  of vision) you should call 911 and go to the nearest emergency room.  Reduce your risk of vascular disease:  Stop smoking. If you would like help call [facility] at 1-800-QUIT-NOW (979 133 3216) or [place] at 7160877394. Manage  your cholesterol Maintain a desired weight Control your diabetes Keep your blood pressure down  If you have any questions, please call the office at [phone].  Disposition: Home  Patient's condition: is Good  Follow up: 1. [facility] in 1 week to check blood pressure and HR   [name], PA-C [facility] [phone]   --- For [facility] use ---   Modified Rankin score at D/C (0-6): 0  IV medication needed for:  1. Hypertension: No 2. Hypotension: No  Post-op Complications: No  1. Post-op CVA or TIA: No  If yes: Event classification (right eye, left eye, right cortical, left cortical, verterobasilar, other):   If yes: Timing of event (intra-op, <6 hrs post-op, >=6 hrs post-op, unknown):   2. CN injury: No  If yes: CN  injuried   3. Myocardial infarction: No  If yes: Dx by (EKG or clinical, Troponin):   4.  CHF: No  5.  Dysrhythmia (new): No  6. Wound infection: No  7. Reperfusion symptoms: No  8. Return to OR: No  If yes: return to OR for (bleeding, neurologic, other CEA incision, other):   Discharge medications: Statin use:  Yes ASA use:  Yes   Beta blocker use:  No ACE-Inhibitor use:  No  ARB use:  No CCB use: No P2Y12 Antagonist use: Yes, [ x] Plavix , [ ]  Plasugrel, [ ]  Ticlopinine, [ ]  Ticagrelor, [ ]  Other, [ ]  No for medical reason, [ ]  Non-compliant, [ ]  Not-indicated Anti-coagulant use:  , [ ]  Warfarin, [ ]  Rivaroxaban, [ ]  Dabigatran,

## 2024-05-01 ENCOUNTER — Other Ambulatory Visit: Payer: Self-pay

## 2024-05-01 DIAGNOSIS — I6521 Occlusion and stenosis of right carotid artery: Secondary | ICD-10-CM

## 2024-06-06 ENCOUNTER — Ambulatory Visit: Attending: Nurse Practitioner | Admitting: Nurse Practitioner

## 2024-06-06 ENCOUNTER — Encounter: Payer: Self-pay | Admitting: Nurse Practitioner

## 2024-06-06 VITALS — BP 118/78 | HR 63 | Ht 68.0 in | Wt 192.0 lb

## 2024-06-06 DIAGNOSIS — Z951 Presence of aortocoronary bypass graft: Secondary | ICD-10-CM

## 2024-06-06 DIAGNOSIS — I25708 Atherosclerosis of coronary artery bypass graft(s), unspecified, with other forms of angina pectoris: Secondary | ICD-10-CM | POA: Diagnosis not present

## 2024-06-06 DIAGNOSIS — I10 Essential (primary) hypertension: Secondary | ICD-10-CM

## 2024-06-06 DIAGNOSIS — I471 Supraventricular tachycardia, unspecified: Secondary | ICD-10-CM

## 2024-06-06 DIAGNOSIS — I493 Ventricular premature depolarization: Secondary | ICD-10-CM | POA: Diagnosis not present

## 2024-06-06 DIAGNOSIS — Z8673 Personal history of transient ischemic attack (TIA), and cerebral infarction without residual deficits: Secondary | ICD-10-CM

## 2024-06-06 DIAGNOSIS — I6523 Occlusion and stenosis of bilateral carotid arteries: Secondary | ICD-10-CM

## 2024-06-06 DIAGNOSIS — E785 Hyperlipidemia, unspecified: Secondary | ICD-10-CM

## 2024-06-06 DIAGNOSIS — E118 Type 2 diabetes mellitus with unspecified complications: Secondary | ICD-10-CM

## 2024-06-06 DIAGNOSIS — Z86711 Personal history of pulmonary embolism: Secondary | ICD-10-CM

## 2024-06-06 MED ORDER — METOPROLOL SUCCINATE ER 25 MG PO TB24
25.0000 mg | ORAL_TABLET | Freq: Every day | ORAL | 3 refills | Status: AC
Start: 2024-06-06 — End: ?

## 2024-06-06 NOTE — Patient Instructions (Signed)
 Medication Instructions:  Your physician recommends that you continue on your current medications as directed. Please refer to the Current Medication list given to you today.  *If you need a refill on your cardiac medications before your next appointment, please call your pharmacy*  Lab Work: NONE ordered at this time of appointment   Testing/Procedures: NONE ordered at this time of appointment   Follow-Up: At [facility], you and your health needs are our priority.  As part of our continuing mission to provide you with exceptional heart care, our providers are all part of one team.  This team includes your primary Cardiologist (physician) and Advanced Practice Providers or APPs (Physician Assistants and Nurse Practitioners) who all work together to provide you with the care you need, when you need it.  Your next appointment:   6 month(s)  Provider:   Lonni Cash, MD    We recommend signing up for the patient portal called MyChart.  Sign up information is provided on this After Visit Summary.  MyChart is used to connect with patients for Virtual Visits (Telemedicine).  Patients are able to view lab/test results, encounter notes, upcoming appointments, etc.  Non-urgent messages can be sent to your provider as well.   To learn more about what you can do with MyChart, go to [email or web address].

## 2024-06-06 NOTE — Progress Notes (Signed)
 Office Visit    Patient Name: Miranda [name] Date of Encounter: 06/06/2024  Primary Care Provider:  Leontine Cramp, NP Primary Cardiologist:  Lonni Cash, MD  Chief Complaint    73 year old female with a history of CAD s/p CABG x4  (LIMA-LAD, RIMA-PL, SVG-diagonal, SVG-OM1) in 2021, PVCs, PSVT, PE, CVA, carotid artery disease s/p left endarterectomy in 04/2024, PAD, hypertension, hyperlipidemia, type 2 diabetes, gout, and former tobacco use who presents for follow-up related to palpitations.  Past Medical History    Past Medical History:  Diagnosis Date   Aortic atherosclerosis (HCC)    Asthma    Carotid artery disease    Carotid US  11/2019: R 1-39; L 60-79   Carotid stenosis    Chronic obstructive pulmonary disease    Coronary artery disease    s/p NSTEMI 11/2019 >> s/p CABG // Echo 2.14.2021: EF 50-55, Gr 1 DD, RVSP 31.9   CVA (cerebral vascular accident) (HCC) 04/09/2024   Diabetes (HCC)    type 2, no meds   Gout    History of blood transfusion 11/2019   History of cigarette smoking    quit   History of pulmonary embolism    Pt notes episode in her 34s // Dx with RLL PE at time of NSTEMI in 11/2019 >> Apixaban    HLD (hyperlipidemia)    Hypertension    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)    NSVT (nonsustained ventricular tachycardia) (HCC)    Obese    Osteoarthritis    Osteochondral lesion of talar dome    Osteopenia    Pneumonia 11/2019   PVC (premature ventricular contraction)    Skin mass    Stage B ACC/AHA asymptomatic heart failure (HCC)    SVT (supraventricular tachycardia) (HCC)    Vitamin D deficiency    Past Surgical History:  Procedure Laterality Date   ABDOMINAL HYSTERECTOMY     CORONARY ARTERY BYPASS GRAFT N/A 11/29/2019   Procedure: CORONARY ARTERY BYPASS GRAFTING (CABG) using the LIMA to LAD; RIMA to PL; Endoscopic harvested right greater saphenous vein: SVC to Diag; SVC to OM1.;  Surgeon: German Bartlett PEDLAR, MD;   Location: [facility] OR;  Service: Open Heart Surgery;  Laterality: N/A;  bilateral IMA   ENDARTERECTOMY Left 04/18/2024   Procedure: ENDARTERECTOMY, CAROTID;  Surgeon: Lanis [name], MD;  Location: [facility] OR;  Service: Vascular;  Laterality: Left;   ENDOVEIN HARVEST OF GREATER SAPHENOUS VEIN Right 11/29/2019   Procedure: Jethro Rubins Of Greater Saphenous Vein;  Surgeon: German Bartlett PEDLAR, MD;  Location: [facility] OR;  Service: Open Heart Surgery;  Laterality: Right;   LEFT HEART CATH AND CORONARY ANGIOGRAPHY N/A 11/27/2019   Procedure: LEFT HEART CATH AND CORONARY ANGIOGRAPHY;  Surgeon: [place], Peter M, MD;  Location: [facility] INVASIVE CV LAB;  Service: Cardiovascular;  Laterality: N/A;   PATCH ANGIOPLASTY Left 04/18/2024   Procedure: ANGIOPLASTY, USING PATCH GRAFT;  Surgeon: Lanis [name], MD;  Location: [facility] OR;  Service: Vascular;  Laterality: Left;   TEE WITHOUT CARDIOVERSION N/A 11/29/2019   Procedure: TRANSESOPHAGEAL ECHOCARDIOGRAM (TEE);  Surgeon: German Bartlett PEDLAR, MD;  Location: [facility] OR;  Service: Open Heart Surgery;  Laterality: N/A;    Allergies  Allergies  Allergen Reactions   Ezetimibe      previous intolerance to multiple statins (rosuvastatin , atorvastatin ) and ezetimibe  per discharge summary note 04/10/2024   Statins      previous intolerance to multiple statins (rosuvastatin , atorvastatin ) and ezetimibe.      Labs/Other  Studies Reviewed    The following studies were reviewed today:  Cardiac Studies & Procedures   ______________________________________________________________________________________________ CARDIAC CATHETERIZATION  CARDIAC CATHETERIZATION 11/27/2019  Conclusion  Mid LM lesion is 60% stenosed.  Ost LAD to Prox LAD lesion is 95% stenosed.  Prox LAD to Mid LAD lesion is 80% stenosed.  Ost Cx to Prox Cx lesion is 95% stenosed.  Mid Cx lesion is 99% stenosed.  Prox RCA to Mid RCA lesion is 80% stenosed.  LV end diastolic pressure is normal.  1. Critical 3 vessel  obstructive CAD 2. Normal LVEDP  Plan: reviewed with Dr Verlin. Plan CT surgery consult. Patient is currently without chest pain and hemodynamically stable. Will resume heparin  post cath.  Findings Coronary Findings Diagnostic  Dominance: Right  Left Main The vessel is severely calcified. Mid LM lesion is 60% stenosed.  Left Anterior Descending Ost LAD to Prox LAD lesion is 95% stenosed. The lesion is severely calcified. Prox LAD to Mid LAD lesion is 80% stenosed. The lesion is severely calcified.  First Septal Branch Collaterals 1st Sept filled by collaterals from RPDA.  Left Circumflex Collaterals Dist Cx filled by collaterals from RPAV.  Ost Cx to Prox Cx lesion is 95% stenosed. Mid Cx lesion is 99% stenosed. The lesion is severely calcified.  Third Obtuse Marginal Branch Collaterals 3rd Mrg filled by collaterals from 3rd RPL.  Right Coronary Artery Prox RCA to Mid RCA lesion is 80% stenosed. The lesion is severely calcified.  Intervention  No interventions have been documented.     ECHOCARDIOGRAM  ECHOCARDIOGRAM COMPLETE 04/10/2024  Narrative ECHOCARDIOGRAM REPORT    Patient Name:   Miranda [name] Date of Exam: 04/10/2024 Medical Rec #:  [id number]       Height:       68.0 in Accession #:    [id number]      Weight:       199.7 lb Date of Birth:  [birth year]        BSA:          2.043 m Patient Age:    72 years        BP:           154/98 mmHg Patient Gender: F               HR:           43 bpm. Exam Location:  Inpatient  Procedure: 2D Echo, Cardiac Doppler and Color Doppler (Both Spectral and Color Flow Doppler were utilized during procedure).  Indications:    Stroke  History:        Patient has no prior history of Echocardiogram examinations. Signs/Symptoms:Bacteremia; Risk Factors:Hypertension.  Sonographer:    Vella Key Referring Phys: FRANKY HALEY, N  IMPRESSIONS   1. Left ventricular ejection fraction, by estimation, is 55 to 60%.  The left ventricle has normal function. The left ventricle has no regional wall motion abnormalities. Left ventricular diastolic parameters were normal. 2. Right ventricular systolic function is normal. The right ventricular size is normal. 3. Left atrial size was mildly dilated. 4. The mitral valve is normal in structure. Mild mitral valve regurgitation. No evidence of mitral stenosis. 5. The aortic valve is normal in structure. Aortic valve regurgitation is not visualized. No aortic stenosis is present. 6. The inferior vena cava is normal in size with greater than 50% respiratory variability, suggesting right atrial pressure of 3 mmHg. 7. Frequent ectopy during study.  FINDINGS Left Ventricle: Left ventricular ejection fraction, by estimation,  is 55 to 60%. The left ventricle has normal function. The left ventricle has no regional wall motion abnormalities. The left ventricular internal cavity size was normal in size. There is no left ventricular hypertrophy. Left ventricular diastolic parameters were normal.  Right Ventricle: The right ventricular size is normal. No increase in right ventricular wall thickness. Right ventricular systolic function is normal.  Left Atrium: Left atrial size was mildly dilated.  Right Atrium: Right atrial size was normal in size.  Pericardium: There is no evidence of pericardial effusion.  Mitral Valve: The mitral valve is normal in structure. There is mild thickening of the mitral valve leaflet(s). Mild mitral valve regurgitation. No evidence of mitral valve stenosis.  Tricuspid Valve: The tricuspid valve is normal in structure. Tricuspid valve regurgitation is trivial. No evidence of tricuspid stenosis.  Aortic Valve: The aortic valve is normal in structure. Aortic valve regurgitation is not visualized. No aortic stenosis is present.  Pulmonic Valve: The pulmonic valve was normal in structure. Pulmonic valve regurgitation is mild. No evidence of pulmonic  stenosis.  Aorta: The aortic root is normal in size and structure.  Venous: The inferior vena cava is normal in size with greater than 50% respiratory variability, suggesting right atrial pressure of 3 mmHg.  IAS/Shunts: No atrial level shunt detected by color flow Doppler.   LEFT VENTRICLE PLAX 2D LVIDd:         4.10 cm      Diastology LVIDs:         2.90 cm      LV e' medial:    9.36 cm/s LV PW:         0.90 cm      LV E/e' medial:  13.7 LV IVS:        0.90 cm      LV e' lateral:   10.80 cm/s LVOT diam:     1.70 cm      LV E/e' lateral: 11.9 LV SV:         38 LV SV Index:   19 LVOT Area:     2.27 cm  LV Volumes (MOD) LV vol d, MOD A2C: 122.0 ml LV vol d, MOD A4C: 100.0 ml LV vol s, MOD A2C: 44.4 ml LV vol s, MOD A4C: 54.6 ml LV SV MOD A2C:     77.6 ml LV SV MOD A4C:     100.0 ml LV SV MOD BP:      62.8 ml  RIGHT VENTRICLE RV Basal diam:  4.10 cm RV S prime:     11.50 cm/s TAPSE (M-mode): 1.3 cm  LEFT ATRIUM             Index        RIGHT ATRIUM           Index LA diam:        4.30 cm 2.11 cm/m   RA Area:     16.40 cm LA Vol (A2C):   83.6 ml 40.93 ml/m  RA Volume:   46.00 ml  22.52 ml/m LA Vol (A4C):   76.1 ml 37.26 ml/m LA Biplane Vol: 82.2 ml 40.24 ml/m AORTIC VALVE LVOT Vmax:   97.28 cm/s LVOT Vmean:  64.150 cm/s LVOT VTI:    0.168 m  AORTA Ao Root diam: 3.10 cm Ao Asc diam:  3.00 cm  MITRAL VALVE MV Area (PHT): 5.66 cm     SHUNTS MV Decel Time: 134 msec     Systemic VTI:  0.17 m  MV E velocity: 128.00 cm/s  Systemic Diam: 1.70 cm MV A velocity: 118.00 cm/s MV E/A ratio:  1.08  Aditya Sabharwal Electronically signed by Ria Commander Signature Date/Time: 04/10/2024/2:53:31 PM    Final   TEE  ECHO INTRAOPERATIVE TEE 11/29/2019  Narrative *INTRAOPERATIVE TRANSESOPHAGEAL REPORT *    Patient Name:   EVI MCCOMB [facility] Date of Exam: 11/29/2019 Medical Rec #:  [id number]       Height:       68.0 in Accession #:    [id number]      Weight:        178.5 lb Date of Birth:  10-29-1950        BSA:          1.95 m Patient Age:    68 years        BP:           147/85 mmHg Patient Gender: F               HR:           69 bpm. Exam Location:  Anesthesiology  Transesophogeal exam was perform intraoperatively during surgical procedure. Patient was closely monitored under general anesthesia during the entirety of examination.  Indications:     Chest pain Sonographer:     Lyle Marc RDCS Performing Phys: [id number] AMNJILD Z ATKINS Diagnosing Phys: Lamar Needle MD  Complications: No known complications during this procedure. POST-OP IMPRESSIONS - Left Ventricle: The left ventricle is unchanged from pre-bypass. - Right Ventricle: The right ventricle appears unchanged from pre-bypass. - Aorta: The aorta appears unchanged from pre-bypass. - Left Atrium: The left atrium appears unchanged from pre-bypass. - Left Atrial Appendage: The left atrial appendage appears unchanged from pre-bypass. - Aortic Valve: The aortic valve appears unchanged from pre-bypass. - Mitral Valve: The mitral valve appears unchanged from pre-bypass. - Tricuspid Valve: The tricuspid valve appears unchanged from pre-bypass. - Interatrial Septum: The interatrial septum appears unchanged from pre-bypass. - Interventricular Septum: The interventricular septum appears unchanged from pre-bypass. - Pericardium: The pericardium appears unchanged from pre-bypass.  PRE-OP FINDINGS Left Ventricle: The left ventricle has normal systolic function, with an ejection fraction of 55-60%. The cavity size was normal. There is mildly increased left ventricular wall thickness.  Right Ventricle: The right ventricle has normal systolic function. The cavity was normal. There is no increase in right ventricular wall thickness.  Left Atrium: Left atrial size was normal in size.  Right Atrium: Right atrial size was normal in size. Right atrial pressure is estimated at 10  mmHg.  Interatrial Septum: No atrial level shunt detected by color flow Doppler.  Pericardium: There is no evidence of pericardial effusion.  Mitral Valve: The mitral valve is normal in structure. Mitral valve regurgitation is trivial by color flow Doppler.  Tricuspid Valve: The tricuspid valve was normal in structure. Tricuspid valve regurgitation was not visualized by color flow Doppler.  Aortic Valve: The aortic valve is tricuspid There is mild thickening of the aortic valve Aortic valve regurgitation was not visualized by color flow Doppler. There is no stenosis of the aortic valve.  Pulmonic Valve: The pulmonic valve was normal in structure. Pulmonic valve regurgitation is trivial by color flow Doppler.    Lamar Needle MD Electronically signed by Lamar Needle MD Signature Date/Time: 11/30/2019/3:15:02 PM    Final        ______________________________________________________________________________________________     Recent Labs: 04/10/2024: ALT 16 04/19/2024: Hemoglobin 9.5; Platelets 209 04/20/2024: BUN 22; Creatinine, Ser 1.25;  Potassium 3.9; Sodium 139  Recent Lipid Panel    Component Value Date/Time   CHOL 234 (H) 04/10/2024 0228   CHOL 187 05/14/2020 0759   TRIG 110 04/10/2024 0228   HDL 55 04/10/2024 0228   HDL 75 05/14/2020 0759   CHOLHDL 4.3 04/10/2024 0228   VLDL 22 04/10/2024 0228   LDLCALC 157 (H) 04/10/2024 0228   LDLCALC 90 05/14/2020 0759    History of Present Illness   73 year old female with the above past medical history including CAD s/p CABG x4  (LIMA-LAD, RIMA-PL, SVG-diagonal, SVG-OM1) in 2021, PVCs, PSVT, PE, CVA, carotid artery disease s/p left endarterectomy in 04/2024, PAD, hypertension, hyperlipidemia, type 2 diabetes, gout, and former tobacco use.  Previously followed by Dr. Verlin.  She was hospitalized in February 2021 in the setting of NSTEMI, pneumonia, acute PE. Cardiac catheterization revealed three-vessel CAD. She  underwent CABG x 4 in 2021.  Echocardiogram at the time showed EF 50 to 55%, mild MR.  Cardiac monitor in 2025 revealed frequent PVCs (24% burden), brief runs of SVT.  She was last seen in the office on 02/23/2024 and reported intermittent palpitations.  She was started on metoprolol .  She was hospitalized in June 2025 in the setting of TIA/CVA.  MRI of the brain showed lacunar infarct in left frontal lobe.  She was noted to have 80 to 99% LICA, 60 to 79% R ICA.  Echocardiogram in 03/2024 showed EF 55 to 60%, normal LV function, no RWMA, normal RV systolic function, mild mitral valve regurgitation.  She underwent L CEA 04/2024.  She was started on Leqvio given statin intolerance.  She presents today for follow-up. Since her last visit and recent hospitalization she has done well from a cardiac standpoint. She denies any recent palpitations.  She has not started taking Leqvio, and would like to consider an alternative. Overall, she reports feeling well.  Home Medications    Current Outpatient Medications  Medication Sig Dispense Refill   albuterol  (VENTOLIN  HFA) 108 (90 Base) MCG/ACT inhaler Inhale 2 puffs into the lungs every 4 (four) hours as needed for wheezing or shortness of breath.      allopurinol (ZYLOPRIM) 100 MG tablet Take 100 mg by mouth daily.     aspirin  EC 81 MG tablet Take 1 tablet (81 mg total) by mouth daily. Swallow whole. 30 tablet 12   cetirizine (ZYRTEC) 10 MG tablet Take 10 mg by mouth daily as needed for allergies.     colchicine  0.6 MG tablet Take 0.6 mg by mouth daily as needed (gout flares).     indomethacin  (INDOCIN ) 25 MG capsule Take 1 capsule (25 mg total) by mouth 3 (three) times daily as needed. 20 capsule 0   metFORMIN (GLUCOPHAGE-XR) 500 MG 24 hr tablet Take 500 mg by mouth daily.     oxyCODONE -acetaminophen  (PERCOCET/ROXICET) 5-325 MG tablet Take 1 tablet by mouth every 6 (six) hours as needed for moderate pain (pain score 4-6). 15 tablet 0   clopidogrel  (PLAVIX ) 75 MG  tablet Take 1 tablet (75 mg total) by mouth daily. (Patient not taking: Reported on 06/06/2024) 21 tablet 0   metoprolol  succinate (TOPROL -XL) 25 MG 24 hr tablet Take 1 tablet (25 mg total) by mouth daily. 90 tablet 3   No current facility-administered medications for this visit.     Review of Systems    She denies chest pain, palpitations, dyspnea, pnd, orthopnea, n, v, dizziness, syncope, edema, weight gain, or early satiety. All other systems reviewed and are  otherwise negative except as noted above.   Physical Exam    VS:  BP 118/78   Pulse 63   Ht 5' 8 (1.727 m)   Wt 192 lb (87.1 kg)   SpO2 97%   BMI 29.19 kg/m   GEN: Well nourished, well developed, in no acute distress. HEENT: normal. Neck: Supple, no JVD, carotid bruits, or masses. Cardiac: RRR, no murmurs, rubs, or gallops. No clubbing, cyanosis, edema.  Radials/DP/PT 2+ and equal bilaterally.  Respiratory:  Respirations regular and unlabored, clear to auscultation bilaterally. GI: Soft, nontender, nondistended, BS + x 4. MS: no deformity or atrophy. Skin: warm and dry, no rash. Neuro:  Strength and sensation are intact. Psych: Normal affect.  Accessory Clinical Findings    ECG personally reviewed by me today -    - no EKG in office today.   Lab Results  Component Value Date   WBC 9.9 04/19/2024   HGB 9.5 (L) 04/19/2024   HCT 29.6 (L) 04/19/2024   MCV 94.0 04/19/2024   PLT 209 04/19/2024   Lab Results  Component Value Date   CREATININE 1.25 (H) 04/20/2024   BUN 22 04/20/2024   NA 139 04/20/2024   K 3.9 04/20/2024   CL 107 04/20/2024   CO2 22 04/20/2024   Lab Results  Component Value Date   ALT 16 04/10/2024   AST 20 04/10/2024   ALKPHOS 53 04/10/2024   BILITOT 0.6 04/10/2024   Lab Results  Component Value Date   CHOL 234 (H) 04/10/2024   HDL 55 04/10/2024   LDLCALC 157 (H) 04/10/2024   TRIG 110 04/10/2024   CHOLHDL 4.3 04/10/2024    Lab Results  Component Value Date   HGBA1C 7.8 (H)  04/10/2024    Assessment & Plan    1. PVCs/PSVT: Denies any recent palpitations, stable on metoprolol .  2. CAD: S/p CABG x4  (LIMA-LAD, RIMA-PL, SVG-diagonal, SVG-OM1) in 2021. Stable with no anginal symptoms. No indication for ischemic evaluation.  Continue aspirin , metoprolol .  History of statin intolerance as below.   3. CVA/carotid artery stenosis: S/p left endarterectomy in 04/2024.  Followed by vascular surgery.  Continue aspirin .   4. History of PE: Resolved. No longer on anticoagulation.   5. Hypertension: BP initially elevated office today, improved with recheck.    6. Hyperlipidemia: LDL was 157 in 03/2024. History of statin intolerance.   Started was on Leqvio per neurology. However, she has not started taking the medication.  She is interested in pursuing alternative lipid-lowering therapy.  Advised her to discuss with neurology.  If she has additional needs, can arrange for follow-up with our pharmacy team.  7. Type 2 diabetes: A1c was 7.8 in 03/2024. Monitored and managed per PCP.   8. Disposition: Follow-up in 6 months.      Damien JAYSON Braver, NP 06/06/2024, 8:40 AM

## 2024-10-26 ENCOUNTER — Ambulatory Visit

## 2024-10-26 ENCOUNTER — Ambulatory Visit (HOSPITAL_COMMUNITY)
Admission: RE | Admit: 2024-10-26 | Discharge: 2024-10-26 | Disposition: A | Discharge status: Home / Self Care | Source: Ambulatory Visit | Attending: Vascular Surgery | Admitting: Vascular Surgery

## 2024-10-26 DIAGNOSIS — I6521 Occlusion and stenosis of right carotid artery: Secondary | ICD-10-CM | POA: Diagnosis not present

## 2024-12-07 ENCOUNTER — Ambulatory Visit
